# Patient Record
Sex: Female | Born: 1960 | Race: White | Hispanic: No | Marital: Single | Smoking: Never smoker
Health system: Southern US, Community
[De-identification: ages and names within clinical notes are randomized; demographics above are authoritative.]

## PROBLEM LIST (undated history)

## (undated) DIAGNOSIS — G4700 Insomnia, unspecified: Secondary | ICD-10-CM

## (undated) DIAGNOSIS — F4310 Post-traumatic stress disorder, unspecified: Secondary | ICD-10-CM

## (undated) DIAGNOSIS — R4702 Dysphasia: Secondary | ICD-10-CM

## (undated) DIAGNOSIS — M1990 Unspecified osteoarthritis, unspecified site: Secondary | ICD-10-CM

## (undated) DIAGNOSIS — I82409 Acute embolism and thrombosis of unspecified deep veins of unspecified lower extremity: Secondary | ICD-10-CM

## (undated) DIAGNOSIS — R569 Unspecified convulsions: Secondary | ICD-10-CM

---

## 2017-01-02 ENCOUNTER — Emergency Department (HOSPITAL_COMMUNITY)
Admission: EM | Admit: 2017-01-02 | Discharge: 2017-01-02 | Disposition: A | Discharge status: Skilled Nursing Facility | Payer: Medicare Other | Attending: Emergency Medicine | Admitting: Emergency Medicine

## 2017-01-02 DIAGNOSIS — R2243 Localized swelling, mass and lump, lower limb, bilateral: Secondary | ICD-10-CM | POA: Diagnosis present

## 2017-01-02 DIAGNOSIS — I82403 Acute embolism and thrombosis of unspecified deep veins of lower extremity, bilateral: Secondary | ICD-10-CM

## 2017-01-02 LAB — I-STAT CHEM 8, ED
BUN: 23 mg/dL — ABNORMAL HIGH (ref 6–20)
Calcium, Ion: 1.19 mmol/L (ref 1.15–1.40)
Chloride: 110 mmol/L (ref 101–111)
Creatinine, Ser: 0.8 mg/dL (ref 0.44–1.00)
Glucose, Bld: 93 mg/dL (ref 65–99)
HCT: 39 % (ref 36.0–46.0)
Hemoglobin: 13.3 g/dL (ref 12.0–15.0)
Potassium: 3.7 mmol/L (ref 3.5–5.1)
Sodium: 147 mmol/L — ABNORMAL HIGH (ref 135–145)
TCO2: 27 mmol/L (ref 22–32)

## 2017-01-02 MED ORDER — RIVAROXABAN (XARELTO) EDUCATION KIT FOR DVT/PE PATIENTS
PACK | Freq: Once | Status: DC
  Filled 2017-01-02: qty 1

## 2017-01-02 MED ORDER — RIVAROXABAN (XARELTO) VTE STARTER PACK (15 & 20 MG): ORAL_TABLET | ORAL | 0 refills | Status: DC

## 2017-01-02 MED ORDER — RIVAROXABAN 15 MG PO TABS
15.0000 mg | ORAL_TABLET | Freq: Once | ORAL | Status: AC
  Administered 2017-01-02: 15 mg via ORAL
  Filled 2017-01-02: qty 1

## 2017-01-02 NOTE — ED Provider Notes (Signed)
[facility]-EMERGENCY DEPT Provider Note   CSN: [id number] Arrival date & time: 01/02/17  1411     History   Chief Complaint Chief Complaint  Patient presents with  . Leg Swelling    HPI Brooke Palmer is a 56 y.o. female.  HPI   56yF sent from [facility] after she had b/l LE venous ultrasound. No formal read but paperwork listing results as:  1) ? Chronic Rt 2) Lt thrombus w/in superficial syst. GSV  Pt has hx of Down's syndrome and not able to provide additional history.   Past history includes: Down's syndrome, obesity, hypothyroidism, dysphagia, osteoarthritis, idiopathic orthostatic hypotension, seizure.  I do not see listed past hx of DVT/PE. No anticoagulants on med list. Not clear if recent surgery, immobilization, etc.  No past medical history on file.  There are no active problems to display for this patient.    OB History    No data available       Home Medications    Prior to Admission medications   Not on File    Family History No family history on file.  Social History Social History   Tobacco Use  . Smoking status: Not on file  Substance Use Topics  . Alcohol use: Not on file  . Drug use: Not on file     Allergies   Patient has no allergy information on record.   Review of Systems Review of Systems  Level 5 cavea because of mental retardation.   Physical Exam Updated Vital Signs BP (!) 131/103 (BP Location: Right Arm)   Pulse 70   Temp 97.8 F (36.6 C) (Oral)   Resp 15   SpO2 98%   Physical Exam  Constitutional: She appears well-developed.  HENT:  Head: Normocephalic and atraumatic.  Eyes: Conjunctivae are normal. Right eye exhibits no discharge. Left eye exhibits no discharge.  Neck: Neck supple.  Cardiovascular: Normal rate, regular rhythm and normal heart sounds. Exam reveals no gallop and no friction rub.  No murmur heard. Pulmonary/Chest: Effort normal and breath sounds normal. No respiratory  distress.  Abdominal: Soft. She exhibits no distension. There is no tenderness.  Musculoskeletal: She exhibits no edema or tenderness.  Sitting in bed with legs flexed underneath her. Resists attempts to extend. No obvious swelling noted. Does repeatedly withdrawal legs back under her when I try to examine. Hard to tell if because from pain or otherwise. Ruddy appearance of feet but warm to touch.   Neurological: She is alert.  Skin: Skin is warm and dry.  Nursing note and vitals reviewed.    ED Treatments / Results  Labs (all labs ordered are listed, but only abnormal results are displayed) Labs Reviewed - No data to display  EKG  EKG Interpretation None       Radiology No results found.  Procedures Procedures (including critical care time)  Medications Ordered in ED Medications - No data to display   Initial Impression / Assessment and Plan / ED Course  I have reviewed the triage vital signs and the nursing notes.  Pertinent labs & imaging results that were available during my care of the patient were reviewed by me and considered in my medical decision making (see chart for details).  Clinical Course as of Jan 03 1543  Wed Jan 02, 2017  1504 Caretaker now at bedside. She reports that patient is non-ambulatory. Likely precipitant. Other labs canceled but will check renal function. Discussed signs/symptoms to seek immediate re-evaluation with caretaker.  Outpt FU otherwise.   [SK]    Clinical Course User Index [SK] Raeford RazorKohut, Julea Hutto, MD    56yF with possible R DVT(?) of unclear chronicity and L greater saphenous clot. Cannot get ROS form patient. Not clear if inciting event. Will obtain hypercoag. Work-up. Will start xarelto. Not tachycardic or hypoxic.  Caretaker now at beside. She has been at their home for about a month. She has not been ambulatory the entire time. She is unsure of her history beyond this.  This is likely precipitant. Coag panel canceled. Will assess  renal function prior to xarelto dosing.   Final Clinical Impressions(s) / ED Diagnoses   Final diagnoses:  Deep vein thrombosis (DVT) of both lower extremities, unspecified chronicity, unspecified vein [facility](HCC)    ED Discharge Orders    None       Raeford RazorKohut, Ashleymarie Granderson, MD 01/03/17 [phone]

## 2017-01-02 NOTE — ED Notes (Signed)
Bed: WA19 Expected date:  Expected time:  Means of arrival:  Comments: EMS leg pain 

## 2017-01-02 NOTE — Discharge Instructions (Addendum)

## 2017-01-02 NOTE — ED Notes (Signed)
ED Provider at bedside. 

## 2017-01-02 NOTE — ED Triage Notes (Signed)
[facility] Doppler was performed today and showed clots in bilateral lower extremities. Paperwork present with patient. Patient sent here for further evaluation.

## 2017-01-18 ENCOUNTER — Telehealth: Payer: Self-pay | Admitting: Hematology

## 2017-01-18 NOTE — Telephone Encounter (Signed)
[facility]. Appt has been scheduled for the pt to see Dr. Candise CheKale on 02/14/17 at 10am. Aware that the pt should arrive 30 minutes early so that she can be checked in on time. Voiced understanding. Contact number for the referring is [phone].

## 2017-01-27 ENCOUNTER — Emergency Department (HOSPITAL_COMMUNITY): Payer: Medicare Other

## 2017-01-27 ENCOUNTER — Encounter (HOSPITAL_COMMUNITY): Payer: Self-pay | Admitting: Emergency Medicine

## 2017-01-27 ENCOUNTER — Emergency Department (HOSPITAL_COMMUNITY)
Admission: EM | Admit: 2017-01-27 | Discharge: 2017-01-27 | Disposition: A | Discharge status: Home / Self Care | Payer: Medicare Other | Attending: Emergency Medicine | Admitting: Emergency Medicine

## 2017-01-27 DIAGNOSIS — Z7901 Long term (current) use of anticoagulants: Secondary | ICD-10-CM | POA: Diagnosis not present

## 2017-01-27 DIAGNOSIS — R569 Unspecified convulsions: Secondary | ICD-10-CM | POA: Insufficient documentation

## 2017-01-27 DIAGNOSIS — H109 Unspecified conjunctivitis: Secondary | ICD-10-CM | POA: Diagnosis not present

## 2017-01-27 DIAGNOSIS — Z79899 Other long term (current) drug therapy: Secondary | ICD-10-CM | POA: Diagnosis not present

## 2017-01-27 DIAGNOSIS — Q909 Down syndrome, unspecified: Secondary | ICD-10-CM | POA: Insufficient documentation

## 2017-01-27 HISTORY — DX: Unspecified convulsions: R56.9

## 2017-01-27 HISTORY — DX: Acute embolism and thrombosis of unspecified deep veins of unspecified lower extremity: I82.409

## 2017-01-27 HISTORY — DX: Post-traumatic stress disorder, unspecified: F43.10

## 2017-01-27 HISTORY — DX: Insomnia, unspecified: G47.00

## 2017-01-27 HISTORY — DX: Dysphasia: R47.02

## 2017-01-27 HISTORY — DX: Unspecified osteoarthritis, unspecified site: M19.90

## 2017-01-27 LAB — CBC WITH DIFFERENTIAL/PLATELET
Basophils Absolute: 0.1 10*3/uL (ref 0.0–0.1)
Basophils Relative: 1 %
Eosinophils Absolute: 0.3 10*3/uL (ref 0.0–0.7)
Eosinophils Relative: 3 %
HEMATOCRIT: 37.6 % (ref 36.0–46.0)
Hemoglobin: 12.5 g/dL (ref 12.0–15.0)
LYMPHS ABS: 1.8 10*3/uL (ref 0.7–4.0)
LYMPHS PCT: 17 %
MCH: 34.2 pg — ABNORMAL HIGH (ref 26.0–34.0)
MCHC: 33.2 g/dL (ref 30.0–36.0)
MCV: 103 fL — AB (ref 78.0–100.0)
MONO ABS: 0.8 10*3/uL (ref 0.1–1.0)
MONOS PCT: 8 %
NEUTROS ABS: 7.5 10*3/uL (ref 1.7–7.7)
Neutrophils Relative %: 71 %
Platelets: 263 10*3/uL (ref 150–400)
RBC: 3.65 MIL/uL — ABNORMAL LOW (ref 3.87–5.11)
RDW: 14.8 % (ref 11.5–15.5)
WBC: 10.4 10*3/uL (ref 4.0–10.5)

## 2017-01-27 LAB — URINALYSIS, ROUTINE W REFLEX MICROSCOPIC
Bilirubin Urine: NEGATIVE
GLUCOSE, UA: NEGATIVE mg/dL
Ketones, ur: NEGATIVE mg/dL
LEUKOCYTES UA: NEGATIVE
Nitrite: NEGATIVE
PH: 7 (ref 5.0–8.0)
PROTEIN: NEGATIVE mg/dL
Specific Gravity, Urine: 1.015 (ref 1.005–1.030)

## 2017-01-27 LAB — COMPREHENSIVE METABOLIC PANEL
ALK PHOS: 114 U/L (ref 38–126)
ALT: 21 U/L (ref 14–54)
ANION GAP: 6 (ref 5–15)
AST: 18 U/L (ref 15–41)
Albumin: 3 g/dL — ABNORMAL LOW (ref 3.5–5.0)
BILIRUBIN TOTAL: 0.8 mg/dL (ref 0.3–1.2)
BUN: 15 mg/dL (ref 6–20)
CALCIUM: 8.7 mg/dL — AB (ref 8.9–10.3)
CO2: 26 mmol/L (ref 22–32)
Chloride: 112 mmol/L — ABNORMAL HIGH (ref 101–111)
Creatinine, Ser: 0.77 mg/dL (ref 0.44–1.00)
GFR calc Af Amer: >60 mL/min (ref >60)
Glucose, Bld: 97 mg/dL (ref 65–99)
POTASSIUM: 3.4 mmol/L — AB (ref 3.5–5.1)
Sodium: 144 mmol/L (ref 135–145)
TOTAL PROTEIN: 7.2 g/dL (ref 6.5–8.1)

## 2017-01-27 MED ORDER — ERYTHROMYCIN 5 MG/GM OP OINT: TOPICAL_OINTMENT | OPHTHALMIC | 0 refills | Status: DC

## 2017-01-27 MED ORDER — SODIUM CHLORIDE 0.9 % IV BOLUS (SEPSIS)
1000.0000 mL | Freq: Once | INTRAVENOUS | Status: AC
  Administered 2017-01-27: 1000 mL via INTRAVENOUS

## 2017-01-27 MED ORDER — SODIUM CHLORIDE 0.9 % IV SOLN
1000.0000 mg | Freq: Once | INTRAVENOUS | Status: AC
  Administered 2017-01-27: 1000 mg via INTRAVENOUS
  Filled 2017-01-27: qty 10

## 2017-01-27 MED ORDER — LEVETIRACETAM 1000 MG PO TABS: 1000.0000 mg | ORAL_TABLET | Freq: Two times a day (BID) | ORAL | 0 refills | Status: DC

## 2017-01-27 NOTE — ED Notes (Signed)
Attempted to draw labs x2 w/o success. Phlebotomy notified

## 2017-01-27 NOTE — ED Provider Notes (Addendum)
[facility]-EMERGENCY DEPT Provider Note   CSN: [id number] Arrival date & time: 01/27/17  [id number]     History   Chief Complaint Chief Complaint  Patient presents with  . Seizures    HPI Brooke Palmer is a 56 y.o. female.  HPI   56 year old female with a history of Down syndrome, DVT prescribed Xarelto in November, seizures, presents from group home with concern for seizure.  Staff had reported that patient is able to answer simple questions at her baseline.  She had a seizure this morning, but the facility was not able to produce paperwork for her.  The history is very limited from the patient, she is unable to answer any questions.  Attempted to call contact listed in our system, who is her legal guardian.  She gave me the number for Blanchie ServeSimone Erny, her care coordinator.  They were not aware that the patient was in the emergency department, are unable to provide further information regarding her medications, reports here at the group home.  Her care coordinator reports she will call back with a number for the group home to obtain more information.  Discussed with staff at group home and group home nurse.  Report she had one seizure at 3am and on at 8am. First lasted 4 minutes, second 2-3 minutes.  She did not get any medications.  As far as the nurse knows, she has not had any changes in medications, and has been taking her seizure medications. She has had one seizure prior to this since she has been at their group home in October.  She was declining and moved to the group home. She has been declining over the last several months but since they started her on CPAP for sleeping she has improved, been more alert.  They report if she has not slept well she will be sleepy during the day.  No known cough, vomiting, congestion, fever. No sick contacts in the house.   Past Medical History:  Diagnosis Date  . Arthritis    osteo  . DVT (deep venous thrombosis) (HCC)    bilateral legs  . Dysphasia   . Insomnia   . PTSD (post-traumatic stress disorder)   . Seizures (HCC)     There are no active problems to display for this patient.   History reviewed. No pertinent surgical history.  OB History    No data available       Home Medications    Prior to Admission medications   Medication Sig Start Date End Date Taking? Authorizing Provider  acetic acid 0.25 % irrigation Irrigate with 1 application as directed 2 (two) times daily.   Yes [provider]  Calcium Carbonate-Vitamin D (OSCAL 500/200 D-3 PO) Take 1 tablet by mouth daily.   Yes [provider]  coal tar (NEUTROGENA T-GEL) 0.5 % shampoo Apply 1 application topically at bedtime as needed.   Yes [provider]  fludrocortisone (FLORINEF) 0.1 MG tablet Take 0.1 mg by mouth daily. 12/12/16  Yes [provider]  lamoTRIgine (LAMICTAL) 100 MG tablet Take 100 mg by mouth daily. 12/12/16  Yes [provider]  levothyroxine (SYNTHROID, LEVOTHROID) 100 MCG tablet Take 100 mcg by mouth daily. 12/12/16  Yes [provider]  Melatonin 3 MG TABS Take 3 mg by mouth daily.   Yes [provider]  memantine (NAMENDA) 5 MG tablet Take 5 mg by mouth 2 (two) times daily. 12/12/16  Yes [provider]  Menthol-Zinc Oxide (RISAMINE)  0.44-20.625 % OINT Apply 1 application topically 2 (two) times daily.   Yes [provider]  metroNIDAZOLE (METROCREAM) 0.75 % cream Apply 1 application topically 2 (two) times daily. 12/03/16  Yes [provider]  montelukast (SINGULAIR) 10 MG tablet Take 10 mg by mouth daily. 12/12/16  Yes [provider]  nabumetone (RELAFEN) 500 MG tablet Take 500 mg by mouth daily. 12/12/16  Yes [provider]  omeprazole (PRILOSEC) 20 MG capsule Take 20 mg by mouth daily. 12/12/16  Yes [provider]  rivaroxaban (XARELTO) 20 MG TABS tablet Take 20 mg by mouth daily with supper.    Yes [provider]  sertraline (ZOLOFT) 25 MG tablet Take 25 mg by mouth daily. 12/12/16  Yes [provider]  erythromycin ophthalmic ointment Place a 1/2 inch ribbon of ointment into the lower eyelid QID for one week. 01/27/17   Alvira Monday, MD  levETIRAcetam (KEPPRA) 1000 MG tablet Take 1 tablet (1,000 mg total) by mouth 2 (two) times daily. 01/27/17   Alvira Monday, MD    Family History No family history on file.  Social History Social History   Tobacco Use  . Smoking status: Never Smoker  . Smokeless tobacco: Never Used  Substance Use Topics  . Alcohol use: No    Frequency: Never  . Drug use: No     Allergies   Patient has no known allergies.   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 No distress.  HENT:  Head: Normocephalic and atraumatic.  Eyes: Conjunctivae and EOM are normal. Pupils are equal, round, and reactive to light.  Yellow discharge from right eye  Neck: Normal range of motion.  Cardiovascular: Normal rate, regular rhythm, normal heart sounds and intact distal pulses. Exam reveals no gallop and no friction rub.  No murmur heard. Pulmonary/Chest: Effort normal and breath sounds normal. No respiratory distress. She has no wheezes. She has no rales.  Abdominal: Soft. She exhibits no distension. There is no tenderness. There is no guarding.  Musculoskeletal: She exhibits no edema or tenderness.  Neurological:  Sleepy but awakens with exam, cries, not following commands on initial eval  Skin: Skin is warm and dry. No rash noted. She is not diaphoretic. No erythema.  Erythema bilateral feet (noted on prior exams)ulcer between toes of left foot,     Nursing note and vitals reviewed.    ED Treatments / Results  Labs (all labs ordered are listed, but only abnormal results are displayed) Labs Reviewed  CBC WITH DIFFERENTIAL/PLATELET - Abnormal; Notable for the following components:      Result Value   RBC 3.65 (*)    MCV 103.0 (*)    MCH 34.2 (*)    All other components within normal limits  COMPREHENSIVE METABOLIC PANEL - Abnormal; Notable for the following components:   Potassium 3.4 (*)    Chloride 112 (*)    Calcium 8.7 (*)    Albumin 3.0 (*)    All other components within normal limits  URINALYSIS, ROUTINE W REFLEX MICROSCOPIC - Abnormal; Notable for the following components:   Color, Urine YELLOW (*)    APPearance CLOUDY (*)    Hgb  urine dipstick SMALL (*)    Bacteria, UA MANY (*)    Squamous Epithelial / LPF 0-5 (*)    All other components within normal limits  URINE CULTURE  LAMOTRIGINE LEVEL    EKG  EKG Interpretation  Date/Time:  Sunday January 27 2017 09:35:09 EST Ventricular Rate:  73 PR Interval:    QRS Duration: 96 QT Interval:  397 QTC Calculation: 438 R Axis:   82 Text Interpretation:  Sinus rhythm No previous ECGs available Confirmed by Alvira Monday ([id number]) on 01/27/2017 10:12:21 AM       Radiology Ct Head Wo Contrast  Result Date: 01/27/2017 CLINICAL DATA:  C-spine trauma.  Seizure. EXAM: CT HEAD WITHOUT CONTRAST CT CERVICAL SPINE WITHOUT CONTRAST TECHNIQUE: Multidetector CT imaging of the head and cervical spine was performed following the standard protocol without intravenous contrast. Multiplanar CT image reconstructions of the cervical spine were also generated. COMPARISON:  None. FINDINGS: CT HEAD FINDINGS Brain: There is a punctate hyperdense focus in the left basal ganglia. There is a similar less prominent focus in the right basal ganglia these are felt to represent basal ganglia calcifications. Allowing for this, there is no definitive evidence of acute intracranial hemorrhage.  Global atrophy. There is severe dilatation of the lateral ventricles. Moderate dilatation of the third ventricle. Fourth ventricle is mildly dilated. There is no mass effect or midline shift. Vascular: No hyperdense vessel or unexpected calcification. Skull: Cranium is intact. Sinuses/Orbits: No evidence of vitreous or orbital hemorrhage. Paranasal sinuses are grossly clear. Other: Noncontributory CT CERVICAL SPINE FINDINGS There is straightening of the normally lordotic cervical spine. There is some malalignment of the left atlantoaxial joint. No definite acute fracture or dislocation. The pre dens space is at the upper limit of normal in the gap distance. Chronic bony densities adjacent to the odontoid is present. Advanced spondylitic changes with calcification of the posterior longitudinal ligament. This results in spinal stenosis at C4-5. There is less prominent spinal stenosis at other levels. There is no obvious spinal hematoma or soft tissue hematoma within the neck. Prominent central disc herniation is suspected at C3-4 with spinal stenosis. Lung apices are grossly clear. Thyroid is unremarkable. There is advanced disc space narrowing at C3-4, C4-5, and C5-6. Moderate narrowing occurs at the C2-3 and C6-7 disc spaces. IMPRESSION: Head: No acute intracranial pathology. 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 Cervical spine: No acute bony injury. 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 Prominent central disc herniation is suspected at C3-4 causing spinal stenosis. Electronically Signed   By: Jolaine Click M.D.   On: 01/27/2017 11:33   Ct Cervical Spine Wo Contrast  Result Date: 01/27/2017 CLINICAL DATA:  C-spine trauma.  Seizure. EXAM: CT HEAD WITHOUT CONTRAST CT CERVICAL SPINE WITHOUT CONTRAST TECHNIQUE: Multidetector CT imaging of the head and cervical spine was performed following the standard protocol without intravenous  contrast. Multiplanar CT image reconstructions of the cervical spine were also generated. COMPARISON:  None. FINDINGS: CT HEAD FINDINGS Brain: There is a punctate hyperdense focus in the left basal ganglia. There is a similar less prominent focus in the right basal ganglia these are felt to represent basal ganglia calcifications. Allowing for this, there is no definitive evidence of acute intracranial hemorrhage. Global atrophy. There is severe dilatation of the lateral ventricles. Moderate dilatation of the third ventricle. Fourth ventricle is mildly dilated. There is no mass effect or midline shift. Vascular: No hyperdense vessel or unexpected calcification. Skull: Cranium is intact.  Sinuses/Orbits: No evidence of vitreous or orbital hemorrhage. Paranasal sinuses are grossly clear. Other: Noncontributory CT CERVICAL SPINE FINDINGS There is straightening of the normally lordotic cervical spine. There is some malalignment of the left atlantoaxial joint. No definite acute fracture or dislocation. The pre dens space is at the upper limit of normal in the gap distance. Chronic bony densities adjacent to the odontoid is present. Advanced spondylitic changes with calcification of the posterior longitudinal ligament. This results in spinal stenosis at C4-5. There is less prominent spinal stenosis at other levels. There is no obvious spinal hematoma or soft tissue hematoma within the neck. Prominent central disc herniation is suspected at C3-4 with spinal stenosis. Lung apices are grossly clear. Thyroid is unremarkable. There is advanced disc space narrowing at C3-4, C4-5, and C5-6. Moderate narrowing occurs at the C2-3 and C6-7 disc spaces. IMPRESSION: Head: No acute intracranial pathology. 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 Cervical spine: No acute bony injury. 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 Prominent central disc herniation is  suspected at C3-4 causing spinal stenosis. Electronically Signed   By: Jolaine ClickArthur  Hoss M.D.   On: 01/27/2017 11:33    Procedures Procedures (including critical care time)  Medications Ordered in ED Medications  sodium chloride 0.9 % bolus 1,000 mL (0 mLs Intravenous Stopped 01/27/17 1159)  levETIRAcetam (KEPPRA) 1,000 mg in sodium chloride 0.9 % 100 mL IVPB (0 mg Intravenous Stopped 01/27/17 1159)     Initial Impression / Assessment and Plan / ED Course  I have reviewed the triage vital signs and the nursing notes.  Pertinent labs & imaging results that were available during my care of the patient were reviewed by me and considered in my medical decision making (see chart for details).     56 year old female with a history of Down syndrome, DVT prescribed Xarelto in November, seizures, presents from group home with concern for seizure.  History is very limited on arrival, as patient is unable to answer questions, no paperwork was provided by the group home.  Called [facility] services, her legal guardian, as well as her care coordinator.  At this time I am waiting for a number to call back to call the group home to obtain more information.  Placed orders to evaluate electrolytes, look for signs of urinary tract infection, and will load with 1 g of Keppra.  Uncertain what medication patient takes chronically for her seizures at this time.  Given I am not sure whether she had a seizure and fell, that she is on Xarelto, will order head CT to evaluate for intracranial bleed.   Labs and CT without significant findings. Urinalysis shows bacteria but no leukocytes.  No respiratory symptoms per facility, no leukocytosis, no fever. Doubt pneumonia. Patient noted to have conjunctival injection and discharge, possible viral conjunctivitis but will give erythromycin ointment.  Unclear trigger to seizure, possible lack of sleep or viral etiology.  Will increase Keppra to 1000mg  BID from 750mg .  Patient sleepy  in ED, however waking up, looking around room.  Staff states it is not unusual for her to be sleepy after being awake at night which was the case last night, and suspect some post-ictal component. She has not had continuing seizures, is responding per her baseline by history, has no signs of sepsis or infection, and feel she is appropriate for continued outpatient follow up.  Gave rx for conjunctivitis and increased keppra.  Provided number for podiatry given foot  ulceration. Patient with erythema which appears chronic skin changes by history from staff and per prior ED note, doubt acute cellulitis.   Final Clinical Impressions(s) / ED Diagnoses   Final diagnoses:  Seizure (HCC)  Conjunctivitis of both eyes, unspecified conjunctivitis type    ED Discharge Orders        Ordered    levETIRAcetam (KEPPRA) 1000 MG tablet  2 times daily     01/27/17 1613    erythromycin ophthalmic ointment     01/27/17 1614       Alvira Monday, MD 01/27/17 1610    Alvira Monday, MD 01/28/17 1758

## 2017-01-27 NOTE — ED Notes (Signed)
Still no urine output in pure wick at this time

## 2017-01-27 NOTE — Discharge Instructions (Signed)
We have increased your Keppra dosing to 1000mg  twice daily and increase may begin tomorrow 12/17.

## 2017-01-27 NOTE — ED Notes (Signed)
No urine noted in pure wick 

## 2017-01-27 NOTE — ED Triage Notes (Signed)
Pt from group home - [facility] with c/o seizure. Pt has hx of the same. Pt has hx of Down's Syndrome. Staff member sts that pt is able to answer simple questions and sts that pt is at her baseline. Staff sts that pt had seizure this am but unable to produce paperwork for pt. Pt in NAD

## 2017-01-27 NOTE — ED Triage Notes (Signed)
Pt unable to answer screening questions d/t Downs Syndrome. Unable to complete screening questions.

## 2017-01-27 NOTE — ED Notes (Signed)
Brooke Palmer in place to obtain urine specimen.

## 2017-01-27 NOTE — ED Notes (Signed)
Bed: [id number] Expected date: 01/27/17 Expected time: 9:12 AM Means of arrival: Ambulance Comments: seizure

## 2017-01-28 LAB — URINE CULTURE

## 2017-01-29 LAB — LAMOTRIGINE LEVEL: LAMOTRIGINE LVL: 3 ug/mL (ref 2.0–20.0)

## 2017-02-02 ENCOUNTER — Encounter (HOSPITAL_COMMUNITY): Payer: Self-pay | Admitting: Emergency Medicine

## 2017-02-02 ENCOUNTER — Inpatient Hospital Stay (HOSPITAL_COMMUNITY)
Admission: EM | Admit: 2017-02-02 | Discharge: 2017-02-07 | DRG: 682 | Disposition: A | Discharge status: Home / Self Care | Payer: Medicare Other | Attending: Internal Medicine | Admitting: Internal Medicine

## 2017-02-02 ENCOUNTER — Emergency Department (HOSPITAL_COMMUNITY): Payer: Medicare Other

## 2017-02-02 DIAGNOSIS — E039 Hypothyroidism, unspecified: Secondary | ICD-10-CM | POA: Diagnosis present

## 2017-02-02 DIAGNOSIS — R404 Transient alteration of awareness: Secondary | ICD-10-CM

## 2017-02-02 DIAGNOSIS — E038 Other specified hypothyroidism: Secondary | ICD-10-CM | POA: Diagnosis not present

## 2017-02-02 DIAGNOSIS — Q909 Down syndrome, unspecified: Secondary | ICD-10-CM | POA: Diagnosis not present

## 2017-02-02 DIAGNOSIS — D72829 Elevated white blood cell count, unspecified: Secondary | ICD-10-CM | POA: Diagnosis present

## 2017-02-02 DIAGNOSIS — E876 Hypokalemia: Secondary | ICD-10-CM | POA: Diagnosis present

## 2017-02-02 DIAGNOSIS — R4189 Other symptoms and signs involving cognitive functions and awareness: Secondary | ICD-10-CM | POA: Diagnosis present

## 2017-02-02 DIAGNOSIS — N179 Acute kidney failure, unspecified: Secondary | ICD-10-CM | POA: Diagnosis present

## 2017-02-02 DIAGNOSIS — R1310 Dysphagia, unspecified: Secondary | ICD-10-CM | POA: Diagnosis present

## 2017-02-02 DIAGNOSIS — J690 Pneumonitis due to inhalation of food and vomit: Secondary | ICD-10-CM | POA: Diagnosis present

## 2017-02-02 DIAGNOSIS — E860 Dehydration: Secondary | ICD-10-CM | POA: Diagnosis present

## 2017-02-02 DIAGNOSIS — G40909 Epilepsy, unspecified, not intractable, without status epilepticus: Secondary | ICD-10-CM

## 2017-02-02 DIAGNOSIS — I82409 Acute embolism and thrombosis of unspecified deep veins of unspecified lower extremity: Secondary | ICD-10-CM | POA: Diagnosis present

## 2017-02-02 DIAGNOSIS — K219 Gastro-esophageal reflux disease without esophagitis: Secondary | ICD-10-CM | POA: Diagnosis present

## 2017-02-02 DIAGNOSIS — E871 Hypo-osmolality and hyponatremia: Secondary | ICD-10-CM | POA: Diagnosis not present

## 2017-02-02 DIAGNOSIS — I959 Hypotension, unspecified: Secondary | ICD-10-CM | POA: Diagnosis present

## 2017-02-02 DIAGNOSIS — Z7952 Long term (current) use of systemic steroids: Secondary | ICD-10-CM | POA: Diagnosis not present

## 2017-02-02 DIAGNOSIS — R0989 Other specified symptoms and signs involving the circulatory and respiratory systems: Secondary | ICD-10-CM

## 2017-02-02 DIAGNOSIS — Z86718 Personal history of other venous thrombosis and embolism: Secondary | ICD-10-CM

## 2017-02-02 DIAGNOSIS — E870 Hyperosmolality and hypernatremia: Secondary | ICD-10-CM | POA: Diagnosis present

## 2017-02-02 DIAGNOSIS — Z79890 Hormone replacement therapy (postmenopausal): Secondary | ICD-10-CM

## 2017-02-02 DIAGNOSIS — R4701 Aphasia: Secondary | ICD-10-CM | POA: Diagnosis present

## 2017-02-02 DIAGNOSIS — Z7901 Long term (current) use of anticoagulants: Secondary | ICD-10-CM | POA: Diagnosis not present

## 2017-02-02 DIAGNOSIS — R05 Cough: Secondary | ICD-10-CM | POA: Diagnosis present

## 2017-02-02 LAB — CBC WITH DIFFERENTIAL/PLATELET
Basophils Absolute: 0 10*3/uL (ref 0.0–0.1)
Basophils Relative: 0 %
EOS ABS: 0 10*3/uL (ref 0.0–0.7)
EOS PCT: 0 %
HCT: 36.7 % (ref 36.0–46.0)
Hemoglobin: 11.9 g/dL — ABNORMAL LOW (ref 12.0–15.0)
LYMPHS ABS: 1.6 10*3/uL (ref 0.7–4.0)
LYMPHS PCT: 10 %
MCH: 33.8 pg (ref 26.0–34.0)
MCHC: 32.4 g/dL (ref 30.0–36.0)
MCV: 104.3 fL — AB (ref 78.0–100.0)
MONO ABS: 0.7 10*3/uL (ref 0.1–1.0)
Monocytes Relative: 5 %
Neutro Abs: 13.4 10*3/uL — ABNORMAL HIGH (ref 1.7–7.7)
Neutrophils Relative %: 85 %
PLATELETS: 279 10*3/uL (ref 150–400)
RBC: 3.52 MIL/uL — AB (ref 3.87–5.11)
RDW: 15.1 % (ref 11.5–15.5)
WBC: 15.8 10*3/uL — AB (ref 4.0–10.5)

## 2017-02-02 LAB — COMPREHENSIVE METABOLIC PANEL
ALBUMIN: 2.7 g/dL — AB (ref 3.5–5.0)
ALT: 20 U/L (ref 14–54)
AST: 27 U/L (ref 15–41)
Alkaline Phosphatase: 106 U/L (ref 38–126)
Anion gap: 8 (ref 5–15)
BILIRUBIN TOTAL: 0.5 mg/dL (ref 0.3–1.2)
BUN: 26 mg/dL — AB (ref 6–20)
CHLORIDE: 118 mmol/L — AB (ref 101–111)
CO2: 23 mmol/L (ref 22–32)
CREATININE: 1.59 mg/dL — AB (ref 0.44–1.00)
Calcium: 8.9 mg/dL (ref 8.9–10.3)
GFR calc Af Amer: 41 mL/min — ABNORMAL LOW (ref >60)
GFR, EST NON AFRICAN AMERICAN: 35 mL/min — AB (ref >60)
GLUCOSE: 141 mg/dL — AB (ref 65–99)
Potassium: 3.6 mmol/L (ref 3.5–5.1)
Sodium: 149 mmol/L — ABNORMAL HIGH (ref 135–145)
TOTAL PROTEIN: 7.4 g/dL (ref 6.5–8.1)

## 2017-02-02 LAB — BRAIN NATRIURETIC PEPTIDE: B NATRIURETIC PEPTIDE 5: 89.7 pg/mL (ref 0.0–100.0)

## 2017-02-02 MED ORDER — LACTATED RINGERS IV BOLUS (SEPSIS)
1000.0000 mL | Freq: Once | INTRAVENOUS | Status: AC
  Administered 2017-02-02: 1000 mL via INTRAVENOUS

## 2017-02-02 MED ORDER — SODIUM CHLORIDE 0.9 % IV BOLUS (SEPSIS): 1000.0000 mL | Freq: Once | INTRAVENOUS | Status: AC

## 2017-02-02 MED ORDER — ALBUTEROL (5 MG/ML) CONTINUOUS INHALATION SOLN
10.0000 mg/h | INHALATION_SOLUTION | Freq: Once | RESPIRATORY_TRACT | Status: AC
  Administered 2017-02-02: 10 mg/h via RESPIRATORY_TRACT
  Filled 2017-02-02: qty 20

## 2017-02-02 NOTE — ED Provider Notes (Addendum)
[facility]-EMERGENCY DEPT Provider Note   CSN: [id number] Arrival date & time: 02/02/17  2021     History   Chief Complaint Chief Complaint  Patient presents with  . Cough    HPI Brooke Palmer is a 56 y.o. female.  HPI  Patient with multiple medical issues including baseline nonverbal status, cognitive delay presents from her group home due to staff concern of increased work of breathing. Patient herself cannot provide any details of the HPI. Level 5 caveat secondary to nonverbal status. Reportedly the patient was in her usual state of health until today, which was noticed by staff members to have increased work of breathing. EMS notes the patient may have had hypoxia in route, but by the time of ED arrival, after breathing treatment, the patient has had normal oxygen saturation on room air.   Past Medical History:  Diagnosis Date  . Arthritis    osteo  . DVT (deep venous thrombosis) (HCC)    bilateral legs  . Dysphasia   . Insomnia   . PTSD (post-traumatic stress disorder)   . Seizures (HCC)     There are no active problems to display for this patient.   History reviewed. No pertinent surgical history.  OB History    No data available       Home Medications    Prior to Admission medications   Medication Sig Start Date End Date Taking? Authorizing Provider  acetic acid 0.25 % irrigation Irrigate with 1 application as directed 2 (two) times daily.    [provider]  Calcium Carbonate-Vitamin D (OSCAL 500/200 D-3 PO) Take 1 tablet by mouth daily.    [provider]  coal tar (NEUTROGENA T-GEL) 0.5 % shampoo Apply 1 application topically at bedtime as needed.    [provider]  erythromycin ophthalmic ointment Place a 1/2 inch ribbon of ointment into the lower eyelid QID for one week. 01/27/17   Alvira Monday, MD  fludrocortisone (FLORINEF) 0.1 MG tablet Take 0.1 mg by mouth daily. 12/12/16   [provider]  lamoTRIgine (LAMICTAL) 100 MG tablet Take 100 mg by mouth daily. 12/12/16   [provider]  levETIRAcetam (KEPPRA) 1000 MG tablet Take 1 tablet (1,000 mg total) by mouth 2 (two) times daily. 01/27/17   Alvira Monday, MD  levothyroxine (SYNTHROID, LEVOTHROID) 100 MCG tablet Take 100 mcg by mouth daily. 12/12/16   [provider]  Melatonin 3 MG TABS Take 3 mg by mouth daily.    [provider]  memantine (NAMENDA) 5 MG tablet Take 5 mg by mouth 2 (two) times daily. 12/12/16   [provider]  Menthol-Zinc Oxide (RISAMINE) 0.44-20.625 % OINT Apply 1 application topically 2 (two) times daily.    [provider]  metroNIDAZOLE (METROCREAM) 0.75 % cream Apply 1 application topically 2 (two) times daily. 12/03/16   [provider]  montelukast (SINGULAIR) 10 MG tablet Take 10 mg by mouth daily. 12/12/16   [provider]  nabumetone (RELAFEN) 500 MG tablet Take 500 mg by mouth daily. 12/12/16   [provider]  omeprazole (PRILOSEC) 20 MG capsule Take 20 mg by mouth daily. 12/12/16   [provider]  rivaroxaban (XARELTO) 20 MG TABS tablet Take 20 mg by mouth daily with supper.    [provider]  sertraline (ZOLOFT) 25 MG tablet Take 25 mg by mouth daily. 12/12/16   [provider]    Family History No family history on file.  Social History Social History   Tobacco Use  . Smoking status: Never Smoker  . Smokeless tobacco: Never Used  Substance Use Topics  . Alcohol use: No    Frequency: Never  . Drug use: No     Allergies   Patient has no known allergies.   Review of Systems Review of Systems  Unable to perform ROS: Patient nonverbal     Physical Exam Updated Vital Signs BP 103/83 (BP Location: Left Arm)   Pulse 84   Temp 98.4 F (36.9 C) (Oral)   Resp 16   SpO2 95%   Physical Exam  Constitutional: She is oriented to person, place, and time. She has a  sickly appearance. No distress.  Sickly appearing young female awake and alert, sitting upright in bed, noncommunicative  HENT:  Head: Normocephalic and atraumatic.  Eyes: Conjunctivae and EOM are normal.  Cardiovascular: Normal rate and regular rhythm.  Pulmonary/Chest: No stridor. She has decreased breath sounds. She has wheezes.  Abdominal: She exhibits no distension.  Musculoskeletal: She exhibits no edema.  Neurological: She is alert and oriented to person, place, and time. She displays atrophy. No cranial nerve deficit.  Does not follow commands reliably, does not participate in neurologic exam, but does move all extremities spontaneously, has no facial asymmetry. Has stigmata of congenital  Skin: Skin is warm and dry.  Psychiatric: Cognition and memory are impaired. She is noncommunicative.  Nursing note and vitals reviewed.    ED Treatments / Results  Labs (all labs ordered are listed, but only abnormal results are displayed) Labs Reviewed  COMPREHENSIVE METABOLIC PANEL - Abnormal; Notable for the following components:      Result Value   Sodium 149 (*)    Chloride 118 (*)    Glucose, Bld 141 (*)    BUN 26 (*)    Creatinine, Ser 1.59 (*)    Albumin 2.7 (*)    GFR calc non Af Amer 35 (*)    GFR calc Af Amer 41 (*)    All other components within normal limits  CBC WITH DIFFERENTIAL/PLATELET - Abnormal; Notable for the following components:   WBC 15.8 (*)    RBC 3.52 (*)    Hemoglobin 11.9 (*)    MCV 104.3 (*)    Neutro Abs 13.4 (*)    All other components within normal limits  BRAIN NATRIURETIC PEPTIDE    EKG  EKG Interpretation  Date/Time:  Saturday February 02 2017 23:29:04 EST Ventricular Rate:  94 PR Interval:    QRS Duration: 83 QT Interval:  348 QTC Calculation: 436 R Axis:   49 Text Interpretation:  Sinus rhythm Low voltage, precordial leads Baseline wander in lead(s) V1 T wave abnormality Abnormal ekg Confirmed by Gerhard MunchLockwood, Tymon Nemetz [phone]) on  02/02/2017 11:34:22 PM       Radiology Dg Chest 2 View  Result Date: 02/02/2017 CLINICAL DATA:  56 year old female with shortness of breath. EXAM: CHEST  2 VIEW COMPARISON:  None. FINDINGS: Shallow inspiration with minimal bibasilar atelectasis. There is no focal consolidation, pleural effusion, pneumothorax. The cardiac silhouette is within normal limits. Mild prominence of the central vasculature may represent mild vascular congestion. No acute osseous pathology. IMPRESSION: Probable mild vascular congestion.  No focal consolidation. Electronically Signed   By: Elgie CollardArash  Radparvar M.D.   On: 02/02/2017 21:31    Procedures Procedures (including critical care time)  Medications Ordered in ED Medications  lactated ringers bolus 1,000 mL (1,000 mLs Intravenous New Bag/Given 02/02/17 2159)  sodium chloride  0.9 % bolus 1,000 mL (not administered)  albuterol (PROVENTIL,VENTOLIN) solution continuous neb (10 mg/hr Nebulization Given 02/02/17 2136)     Initial Impression / Assessment and Plan / ED Course  I have reviewed the triage vital signs and the nursing notes.  Pertinent labs & imaging results that were available during my care of the patient were reviewed by me and considered in my medical decision making (see chart for details).  10:57 PM Now, following several breathing treatments, the patient's respiratory effort has diminished, lung sounds are more clear, though not completely clear. Initial findings inconsistent with congestive heart failure, though she does have mild persistent rhonchi.  I just discussed the patient's recent status with a nurse from her group home. She notes that the patient is a recent arrival to the facility, has multiple medical issues including Down syndrome, seizure disorder, but had been doing generally well. Patient was noted to have a change in behavior in the hours prior to ED arrival, with less interactivity, though without syncope, fall, fever. Nurse  denies any recent medication changes, diet changes, beyond addition of doxycycline, which the patient has been taking for the past few days. The nurse adds that since arrival to the nursing facility the patient has had a slow decline in overall health status.    This patient with Down syndrome, baseline nonverbal status presents from a group home with change in behavior, cough. The patient is persistently nonverbal, has coarse breath sounds initially, and is mildly hypotensive.  Patient's breathing improved here with bronchodilators, blood pressure improved with fluids, and she remained afebrile. No obvious evidence for pneumonia, no decompensated heart failure, though the latter remains a consideration for early disease. Patient has already been on doxycycline, and given the absence of fever, pneumonia remains likely Patient is found to have acute renal failure, likely contributing to her substantial change in condition at her group home. Patient required admission for further evaluation and management.   Final Clinical Impressions(s) / ED Diagnoses  Altered mental status Acute renal failure   Gerhard MunchLockwood, Kaylynne Andres, MD 02/02/17 [id number]    Gerhard MunchLockwood, Orian Figueira, MD 02/02/17 [phone]

## 2017-02-02 NOTE — ED Notes (Signed)
Respiratory called for breathing treatment.

## 2017-02-02 NOTE — ED Triage Notes (Signed)
Per EMS, patient from group home, staff reports increased lethargy, cough, and congestion. Initially 80% on RA. Hx downs syndrome.   22g L AC NS with EMS  O2 99% on non rebreather BP 140/80  HR 70 CBG 182

## 2017-02-02 NOTE — ED Notes (Signed)
Patient transported to X-ray 

## 2017-02-02 NOTE — ED Notes (Signed)
ED Provider at bedside. 

## 2017-02-03 DIAGNOSIS — E860 Dehydration: Secondary | ICD-10-CM

## 2017-02-03 LAB — CBC
HEMATOCRIT: 31.9 % — AB (ref 36.0–46.0)
HEMOGLOBIN: 10.4 g/dL — AB (ref 12.0–15.0)
MCH: 33.7 pg (ref 26.0–34.0)
MCHC: 32.6 g/dL (ref 30.0–36.0)
MCV: 103.2 fL — ABNORMAL HIGH (ref 78.0–100.0)
Platelets: 255 10*3/uL (ref 150–400)
RBC: 3.09 MIL/uL — ABNORMAL LOW (ref 3.87–5.11)
RDW: 14.8 % (ref 11.5–15.5)
WBC: 12 10*3/uL — ABNORMAL HIGH (ref 4.0–10.5)

## 2017-02-03 LAB — COMPREHENSIVE METABOLIC PANEL
ALBUMIN: 2.4 g/dL — AB (ref 3.5–5.0)
ALT: 27 U/L (ref 14–54)
ANION GAP: 7 (ref 5–15)
AST: 39 U/L (ref 15–41)
Alkaline Phosphatase: 93 U/L (ref 38–126)
BILIRUBIN TOTAL: 0.6 mg/dL (ref 0.3–1.2)
BUN: 27 mg/dL — ABNORMAL HIGH (ref 6–20)
CO2: 23 mmol/L (ref 22–32)
Calcium: 8.7 mg/dL — ABNORMAL LOW (ref 8.9–10.3)
Chloride: 120 mmol/L — ABNORMAL HIGH (ref 101–111)
Creatinine, Ser: 1.27 mg/dL — ABNORMAL HIGH (ref 0.44–1.00)
GFR calc Af Amer: 53 mL/min — ABNORMAL LOW (ref >60)
GFR calc non Af Amer: 46 mL/min — ABNORMAL LOW (ref >60)
GLUCOSE: 98 mg/dL (ref 65–99)
POTASSIUM: 2.9 mmol/L — AB (ref 3.5–5.1)
SODIUM: 150 mmol/L — AB (ref 135–145)
TOTAL PROTEIN: 6.5 g/dL (ref 6.5–8.1)

## 2017-02-03 LAB — HIV ANTIBODY (ROUTINE TESTING W REFLEX): HIV SCREEN 4TH GENERATION: NONREACTIVE

## 2017-02-03 LAB — TSH: TSH: 2.489 u[IU]/mL (ref 0.350–4.500)

## 2017-02-03 MED ORDER — DEXTROSE 5 % IV SOLN
INTRAVENOUS | Status: DC
  Administered 2017-02-03 – 2017-02-05 (×3): via INTRAVENOUS

## 2017-02-03 MED ORDER — HEPARIN SODIUM (PORCINE) 5000 UNIT/ML IJ SOLN: 5000.0000 [IU] | Freq: Three times a day (TID) | INTRAMUSCULAR | Status: DC

## 2017-02-03 MED ORDER — SODIUM CHLORIDE 0.9 % IV SOLN
1000.0000 mg | Freq: Two times a day (BID) | INTRAVENOUS | Status: DC
  Administered 2017-02-03 (×2): 1000 mg via INTRAVENOUS
  Filled 2017-02-03 (×3): qty 10

## 2017-02-03 MED ORDER — MEMANTINE HCL 10 MG PO TABS
5.0000 mg | ORAL_TABLET | Freq: Two times a day (BID) | ORAL | Status: DC
  Administered 2017-02-03 – 2017-02-07 (×9): 5 mg via ORAL
  Filled 2017-02-03 (×9): qty 1

## 2017-02-03 MED ORDER — LEVETIRACETAM 500 MG PO TABS
1000.0000 mg | ORAL_TABLET | Freq: Two times a day (BID) | ORAL | Status: DC
  Administered 2017-02-04 – 2017-02-07 (×7): 1000 mg via ORAL
  Filled 2017-02-03 (×7): qty 2

## 2017-02-03 MED ORDER — ZINC OXIDE 40 % EX OINT
TOPICAL_OINTMENT | Freq: Two times a day (BID) | CUTANEOUS | Status: DC
  Administered 2017-02-03 – 2017-02-07 (×9): via TOPICAL
  Filled 2017-02-03: qty 57

## 2017-02-03 MED ORDER — FLUDROCORTISONE ACETATE 0.1 MG PO TABS
0.1000 mg | ORAL_TABLET | Freq: Every day | ORAL | Status: DC
  Administered 2017-02-03 – 2017-02-07 (×5): 0.1 mg via ORAL
  Filled 2017-02-03 (×5): qty 1

## 2017-02-03 MED ORDER — MENTHOL-ZINC OXIDE 0.44-20.625 % EX OINT: 1.0000 "application " | TOPICAL_OINTMENT | Freq: Two times a day (BID) | CUTANEOUS | Status: DC

## 2017-02-03 MED ORDER — MELATONIN 3 MG PO TABS: 3.0000 mg | ORAL_TABLET | Freq: Every day | ORAL | Status: DC

## 2017-02-03 MED ORDER — LEVETIRACETAM 500 MG PO TABS: 1000.0000 mg | ORAL_TABLET | Freq: Two times a day (BID) | ORAL | Status: DC

## 2017-02-03 MED ORDER — POTASSIUM CHLORIDE CRYS ER 20 MEQ PO TBCR
40.0000 meq | EXTENDED_RELEASE_TABLET | ORAL | Status: AC
  Administered 2017-02-03 (×2): 40 meq via ORAL
  Filled 2017-02-03 (×2): qty 2

## 2017-02-03 MED ORDER — RIVAROXABAN 20 MG PO TABS
20.0000 mg | ORAL_TABLET | Freq: Every day | ORAL | Status: DC
  Administered 2017-02-03 – 2017-02-06 (×4): 20 mg via ORAL
  Filled 2017-02-03 (×4): qty 1

## 2017-02-03 MED ORDER — PANTOPRAZOLE SODIUM 40 MG PO TBEC
40.0000 mg | DELAYED_RELEASE_TABLET | Freq: Every day | ORAL | Status: DC
  Administered 2017-02-03 – 2017-02-07 (×5): 40 mg via ORAL
  Filled 2017-02-03 (×5): qty 1

## 2017-02-03 MED ORDER — LEVOTHYROXINE SODIUM 100 MCG PO TABS
100.0000 ug | ORAL_TABLET | Freq: Every day | ORAL | Status: DC
  Administered 2017-02-03 – 2017-02-07 (×5): 100 ug via ORAL
  Filled 2017-02-03 (×5): qty 1

## 2017-02-03 MED ORDER — NABUMETONE 500 MG PO TABS
500.0000 mg | ORAL_TABLET | Freq: Every day | ORAL | Status: DC
  Administered 2017-02-03 – 2017-02-07 (×5): 500 mg via ORAL
  Filled 2017-02-03 (×5): qty 1

## 2017-02-03 MED ORDER — MONTELUKAST SODIUM 10 MG PO TABS
10.0000 mg | ORAL_TABLET | Freq: Every day | ORAL | Status: DC
  Administered 2017-02-03 – 2017-02-07 (×5): 10 mg via ORAL
  Filled 2017-02-03 (×5): qty 1

## 2017-02-03 MED ORDER — ACETIC ACID 0.25 % IR SOLN: 1.0000 "application " | Freq: Two times a day (BID) | Status: DC

## 2017-02-03 MED ORDER — SERTRALINE HCL 25 MG PO TABS
25.0000 mg | ORAL_TABLET | Freq: Every day | ORAL | Status: DC
  Administered 2017-02-03 – 2017-02-07 (×5): 25 mg via ORAL
  Filled 2017-02-03 (×5): qty 1

## 2017-02-03 MED ORDER — SODIUM CHLORIDE 0.9 % IV SOLN
INTRAVENOUS | Status: DC
  Administered 2017-02-03: 04:00:00 via INTRAVENOUS

## 2017-02-03 MED ORDER — LAMOTRIGINE 100 MG PO TABS
100.0000 mg | ORAL_TABLET | Freq: Every day | ORAL | Status: DC
  Administered 2017-02-03 – 2017-02-07 (×5): 100 mg via ORAL
  Filled 2017-02-03 (×5): qty 1

## 2017-02-03 NOTE — Progress Notes (Signed)
PROGRESS NOTE    Brooke AngelCarolyn Palmer  ZOX:[id number] DOB: 07/11/1960 DOA: 02/02/2017 PCP: Patient, No Pcp Per   Brief Narrative:   Assessment & Plan:   Principal Problem:   ARF (acute renal failure) (HCC) Active Problems:   Dehydration   Leucocytosis   Seizure disorder (HCC)   Hypothyroid   GERD (gastroesophageal reflux disease)   DVT (deep venous thrombosis) (HCC)   Patient is a 56 year old female with past medical history of cognitive delay with aphasia who presented to the emergency department with shortness of breath and weakness.  Patient is from group home.  In the ER patient was found to have dehydration with acute kidney injury and was admitted for further management.  Acute kidney injury: Likely prerenal .on IV fluids. AKI  Improving.  We will continue to monitor her kidney function.  Hyponatremia: Sodium 150 today.  Start on D5W  Hypokalemia: Being supplemented.  We will continue to monitor the levels.  We will check magnesium level.  Leukocytosis: Unknown etiology.  Most likely reactive .  We will continue to monitor trend  Cognitive impairment/aphasia: On baseline. Patient is from group home  Seizure disorder: On Keppra and Lamictal.  History of DVT: On Xarelto  GERD: Continue PPI   DVT prophylaxis: Xarelto Code Status:  Full Family Communication:  None Disposition Plan:  Group Home   Consultants: None  Procedures: None  Antimicrobials: None  Subjective: Patient seen and examined the bedside this morning ,looks comfortable.  Patient is nonverbal at baseline.  Objective: Vitals:   02/03/17 0130 02/03/17 0220 02/03/17 0459 02/03/17 1418  BP: (!) 101/44  101/64 101/69  Pulse: 81 86 95 85  Resp: 19 18 17 18   Temp:  98.1 F (36.7 C) 98.2 F (36.8 C) 98.2 F (36.8 C)  TempSrc:  Oral Oral Oral  SpO2: 98% 96% 94% 93%    Intake/Output Summary (Last 24 hours) at 02/03/2017 1647 Last data filed at 02/03/2017 1500 Gross per 24 hour  Intake 1893.33  ml  Output -  Net 1893.33 ml   There were no vitals filed for this visit.  Examination:  General exam: Appears calm and comfortable ,Not in distress,average built Respiratory system: Bilateral equal air entry, normal vesicular breath sounds, no wheezes or crackles  Cardiovascular system: S1 & S2 heard, RRR. No JVD, murmurs, rubs, gallops or clicks. No pedal edema. Gastrointestinal system: Abdomen is nondistended, soft and nontender. No organomegaly or masses felt. Normal bowel sounds heard. Central nervous system: Alert and oriented. No focal neurological deficits. Extremities: No edema, no clubbing ,no cyanosis, distal peripheral pulses palpable. Skin: No rashes, lesions or ulcers,no icterus ,no pallor Psychiatry:  Not alert or oriented, nonverbal at baseline   Data Reviewed: I have personally reviewed following labs and imaging studies  CBC: Recent Labs  Lab 02/02/17 2131 02/03/17 0633  WBC 15.8* 12.0*  NEUTROABS 13.4*  --   HGB 11.9* 10.4*  HCT 36.7 31.9*  MCV 104.3* 103.2*  PLT 279 255   Basic Metabolic Panel: Recent Labs  Lab 02/02/17 2131 02/03/17 0633  NA 149* 150*  K 3.6 2.9*  CL 118* 120*  CO2 23 23  GLUCOSE 141* 98  BUN 26* 27*  CREATININE 1.59* 1.27*  CALCIUM 8.9 8.7*   GFR: CrCl cannot be calculated (Unknown ideal weight.). Liver Function Tests: Recent Labs  Lab 02/02/17 2131 02/03/17 0633  AST 27 39  ALT 20 27  ALKPHOS 106 93  BILITOT 0.5 0.6  PROT 7.4 6.5  ALBUMIN 2.7* 2.4*  No results for input(s): LIPASE, AMYLASE in the last 168 hours. No results for input(s): AMMONIA in the last 168 hours. Coagulation Profile: No results for input(s): INR, PROTIME in the last 168 hours. Cardiac Enzymes: No results for input(s): CKTOTAL, CKMB, CKMBINDEX, TROPONINI in the last 168 hours. BNP (last 3 results) No results for input(s): PROBNP in the last 8760 hours. HbA1C: No results for input(s): HGBA1C in the last 72 hours. CBG: No results for  input(s): GLUCAP in the last 168 hours. Lipid Profile: No results for input(s): CHOL, HDL, LDLCALC, TRIG, CHOLHDL, LDLDIRECT in the last 72 hours. Thyroid Function Tests: Recent Labs    02/03/17 0633  TSH 2.489   Anemia Panel: No results for input(s): VITAMINB12, FOLATE, FERRITIN, TIBC, IRON, RETICCTPCT in the last 72 hours. Sepsis Labs: No results for input(s): PROCALCITON, LATICACIDVEN in the last 168 hours.  Recent Results (from the past 240 hour(s))  Urine culture     Status: Abnormal   Collection Time: 01/27/17  2:52 PM  Result Value Ref Range Status   Specimen Description URINE, RANDOM  Final   Special Requests NONE  Final   Culture MULTIPLE SPECIES PRESENT, SUGGEST RECOLLECTION (A)  Final   Report Status 01/28/2017 FINAL  Final         Radiology Studies: Dg Chest 2 View  Result Date: 02/02/2017 CLINICAL DATA:  [age] year old female with shortness of breath. EXAM: CHEST  2 VIEW COMPARISON:  None. FINDINGS: Shallow inspiration with minimal bibasilar atelectasis. There is no focal consolidation, pleural effusion, pneumothorax. The cardiac silhouette is within normal limits. Mild prominence of the central vasculature may represent mild vascular congestion. No acute osseous pathology. IMPRESSION: Probable mild vascular congestion.  No focal consolidation. Electronically Signed   By: Elgie CollardArash  Radparvar M.D.   On: 02/02/2017 21:31        Scheduled Meds: . fludrocortisone  0.1 mg Oral Daily  . lamoTRIgine  100 mg Oral Daily  . levothyroxine  100 mcg Oral Daily  . liver oil-zinc oxide   Topical BID  . memantine  5 mg Oral BID  . montelukast  10 mg Oral Daily  . nabumetone  500 mg Oral Daily  . pantoprazole  40 mg Oral Daily  . potassium chloride  40 mEq Oral Q4H  . rivaroxaban  20 mg Oral Q supper  . sertraline  25 mg Oral Daily   Continuous Infusions: . dextrose    . levETIRAcetam Stopped (02/03/17 1104)  . sodium chloride       LOS: 1 day    Time spent: 25  minutes    Clester Chlebowski Salli QuarryAdhikari BK, MD [facility] Pager [phone]  If 7PM-7AM, please contact night-coverage [email or web address] Password [email or web address] 02/03/2017, 4:47 PM

## 2017-02-03 NOTE — Progress Notes (Signed)
Pt arrived to unit room 1509 via stretcher. VS taken, pt alert but nonverbal at baseline.pt in no distress, will continue to monitor and intervene appropriately.

## 2017-02-03 NOTE — H&P (Signed)
History and Physical    Brooke Palmer ZOX:[id number] DOB: 04-14-1960 DOA: 02/02/2017  Referring MD/NP/PA: Jeraldine Loots PCP: Patient, No Pcp Per   Outpatient Specialists: Unknown   Patient coming from: Group home  Chief Complaint: Shortness of breath  HPI: Brooke Palmer is a 56 y.o. female with medical history significant of cognitive delay with aphasia who presented with shortness of breath and weakness. Patient lives in a group home. Then noted patient having some cough and increased work of breathing. EMT were called and patient was brought to the ER. In the ER patient was found to have evidence of dehydration with acute kidney injury. She also has no evidence of hypoxemia and oxygen sats appears stable in the 90s on room air. Patient appears to be weak from dehydration more than anything else. She is being admitted for treatment. Unable to give history so history mainly obtained from EMS records.  ED Course: Patient has received IV fluids and evaluation for possible pneumonia  Review of Systems: Patient is aphasic so this is not obtainable  Past Medical History:  Diagnosis Date  . Arthritis    osteo  . DVT (deep venous thrombosis) (HCC)    bilateral legs  . Dysphasia   . Insomnia   . PTSD (post-traumatic stress disorder)   . Seizures (HCC)     History reviewed. No pertinent surgical history.   reports that  has never smoked. she has never used smokeless tobacco. She reports that she does not drink alcohol or use drugs.  No Known Allergies  No family history on file.   Prior to Admission medications   Medication Sig Start Date End Date Taking? Authorizing Provider  acetic acid (VOSOL) 2 % otic solution Place 4 drops into both ears 2 (two) times daily.   Yes [provider]  Calcium Carbonate-Vitamin D (OSCAL 500/200 D-3 PO) Take 1 tablet by mouth 2 (two) times daily.    Yes [provider]  Cholecalciferol (VITAMIN D3) 2000 units TABS Take 2,000 Units by  mouth daily after breakfast.   Yes [provider]  coal tar (NEUTROGENA T-GEL) 0.5 % shampoo Apply 1 application topically at bedtime as needed (scalp, dandruff).    Yes [provider]  docusate sodium (COLACE) 100 MG capsule Take 100 mg by mouth daily.   Yes [provider]  doxycycline (VIBRA-TABS) 100 MG tablet Take 100 mg by mouth 2 (two) times daily.   Yes [provider]  erythromycin ophthalmic ointment Place a 1/2 inch ribbon of ointment into the lower eyelid QID for one week. 01/27/17  Yes Alvira Monday, MD  fludrocortisone (FLORINEF) 0.1 MG tablet Take 0.1 mg by mouth daily. 12/12/16  Yes [provider]  lamoTRIgine (LAMICTAL) 100 MG tablet Take 100 mg by mouth daily. 12/12/16  Yes [provider]  levETIRAcetam (KEPPRA) 1000 MG tablet Take 1 tablet (1,000 mg total) by mouth 2 (two) times daily. 01/27/17  Yes Alvira Monday, MD  levothyroxine (SYNTHROID, LEVOTHROID) 100 MCG tablet Take 100 mcg by mouth daily. 12/12/16  Yes [provider]  Melatonin 3 MG TABS Take 3 mg by mouth daily.   Yes [provider]  memantine (NAMENDA) 5 MG tablet Take 5 mg by mouth 2 (two) times daily. 12/12/16  Yes [provider]  Menthol-Zinc Oxide (RISAMINE) 0.44-20.625 % OINT Apply 1 application topically 2 (two) times daily.   Yes [provider]  metroNIDAZOLE (METROCREAM) 0.75 % cream Apply 1 application topically 2 (two) times daily. 12/03/16  Yes [provider]  montelukast (SINGULAIR) 10 MG tablet Take 10 mg by mouth daily. 12/12/16  Yes [provider]  omeprazole (PRILOSEC) 20 MG capsule Take 20 mg by mouth daily. 12/12/16  Yes [provider]  rivaroxaban (XARELTO) 20 MG TABS tablet Take 20 mg by mouth daily with supper.   Yes [provider]  sertraline (ZOLOFT) 25 MG tablet Take 25 mg by mouth daily. 12/12/16  Yes [provider]    Physical  Exam: Vitals:   02/02/17 2331 02/03/17 0100 02/03/17 0130 02/03/17 0220  BP: (!) 82/41 111/85 (!) 101/44   Pulse: 91 (!) 48 81 86  Resp: 20 16 19 18   Temp:    98.1 F (36.7 C)  TempSrc:    Oral  SpO2: 95% (!) 85% 98% 96%      Constitutional: NAD, calm, comfortable Vitals:   02/02/17 2331 02/03/17 0100 02/03/17 0130 02/03/17 0220  BP: (!) 82/41 111/85 (!) 101/44   Pulse: 91 (!) 48 81 86  Resp: 20 16 19 18   Temp:    98.1 F (36.7 C)  TempSrc:    Oral  SpO2: 95% (!) 85% 98% 96%   Eyes: PERRL, lids and conjunctivae normal ENMT: Mucous membranes are moist. Posterior pharynx clear of any exudate or lesions.Normal dentition.  Neck: normal, supple, no masses, no thyromegaly Respiratory: clear to auscultation bilaterally, no wheezing, no crackles. Normal respiratory effort. No accessory muscle use.  Cardiovascular: Regular rate and rhythm, no murmurs / rubs / gallops. No extremity edema. 2+ pedal pulses. No carotid bruits.  Abdomen: no tenderness, no masses palpated. No hepatosplenomegaly. Bowel sounds positive.  Musculoskeletal: Obvious deformity in the hands and legs. no clubbing / cyanosis. No joint deformity upper and lower extremities. Good ROM, no contractures. Normal muscle tone.  Skin: no rashes, lesions, ulcers. No induration Neurologic: Patient has mental recommendation but moves all limbs. Psychiatric: Cognitive delay and the fascia    Labs on Admission: I have personally reviewed following labs and imaging studies  CBC: Recent Labs  Lab 01/27/17 1108 02/02/17 2131  WBC 10.4 15.8*  NEUTROABS 7.5 13.4*  HGB 12.5 11.9*  HCT 37.6 36.7  MCV 103.0* 104.3*  PLT 263 279   Basic Metabolic Panel: Recent Labs  Lab 01/27/17 1108 02/02/17 2131  NA 144 149*  K 3.4* 3.6  CL 112* 118*  CO2 26 23  GLUCOSE 97 141*  BUN 15 26*  CREATININE 0.77 1.59*  CALCIUM 8.7* 8.9   GFR: CrCl cannot be calculated (Unknown ideal weight.). Liver Function Tests: Recent Labs  Lab  01/27/17 1108 02/02/17 2131  AST 18 27  ALT 21 20  ALKPHOS 114 106  BILITOT 0.8 0.5  PROT 7.2 7.4  ALBUMIN 3.0* 2.7*   No results for input(s): LIPASE, AMYLASE in the last 168 hours. No results for input(s): AMMONIA in the last 168 hours. Coagulation Profile: No results for input(s): INR, PROTIME in the last 168 hours. Cardiac Enzymes: No results for input(s): CKTOTAL, CKMB, CKMBINDEX, TROPONINI in the last 168 hours. BNP (last 3 results) No results for input(s): PROBNP in the last 8760 hours. HbA1C: No results for input(s): HGBA1C in the last 72 hours. CBG: No results for input(s): GLUCAP in the last 168 hours. Lipid Profile: No results for input(s): CHOL, HDL, LDLCALC, TRIG, CHOLHDL, LDLDIRECT in the last 72 hours. Thyroid Function Tests: No results for input(s): TSH, T4TOTAL, FREET4, T3FREE, THYROIDAB in the last 72 hours. Anemia Panel: No results for input(s): VITAMINB12, FOLATE,  FERRITIN, TIBC, IRON, RETICCTPCT in the last 72 hours. Urine analysis:    Component Value Date/Time   COLORURINE YELLOW (A) 01/27/2017 1452   APPEARANCEUR CLOUDY (A) 01/27/2017 1452   LABSPEC 1.015 01/27/2017 1452   PHURINE 7.0 01/27/2017 1452   GLUCOSEU NEGATIVE 01/27/2017 1452   HGBUR SMALL (A) 01/27/2017 1452   BILIRUBINUR NEGATIVE 01/27/2017 1452   KETONESUR NEGATIVE 01/27/2017 1452   PROTEINUR NEGATIVE 01/27/2017 1452   NITRITE NEGATIVE 01/27/2017 1452   LEUKOCYTESUR NEGATIVE 01/27/2017 1452   Sepsis Labs: @LABRCNTIP (procalcitonin:4,lacticidven:4) ) Recent Results (from the past 240 hour(s))  Urine culture     Status: Abnormal   Collection Time: 01/27/17  2:52 PM  Result Value Ref Range Status   Specimen Description URINE, RANDOM  Final   Special Requests NONE  Final   Culture MULTIPLE SPECIES PRESENT, SUGGEST RECOLLECTION (A)  Final   Report Status 01/28/2017 FINAL  Final     Radiological Exams on Admission: Dg Chest 2 View  Result Date: 02/02/2017 CLINICAL DATA:   56 year old female with shortness of breath. EXAM: CHEST  2 VIEW COMPARISON:  None. FINDINGS: Shallow inspiration with minimal bibasilar atelectasis. There is no focal consolidation, pleural effusion, pneumothorax. The cardiac silhouette is within normal limits. Mild prominence of the central vasculature may represent mild vascular congestion. No acute osseous pathology. IMPRESSION: Probable mild vascular congestion.  No focal consolidation. Electronically Signed   By: Elgie CollardArash  Radparvar M.D.   On: 02/02/2017 21:31    EKG: Independently reviewed.   Assessment/Plan Principal Problem:   ARF (acute renal failure) (HCC) Active Problems:   Dehydration   Leucocytosis   Seizure disorder (HCC)   Hypothyroid   GERD (gastroesophageal reflux disease)   DVT (deep venous thrombosis) (HCC)   #1 acute kidney injury: Patient will be aggressively hydrated. Her oral intake may not be good. I suspect prerenal azotemia. Continue monitoring renal function. May consider renal ultrasound if renal function continues to worsen.  #2 dehydration: Hydrate patient aggressively.  #3 hypothyroidism: Check TSH and continue with monitoring  #4 seizure disorder: Continue home regimen of medications.  #5 GERD: Continue PPIs  #6 cognitive delay: Chronic patient lives in a group home. She'll return to group home when stable  DVT prophylaxis: Lovenox   Code Status: Full  Family Communication: No family available  Disposition Plan: Back to group home   Consults called: none   Admission status: inpatient   Severity of Illness: The appropriate patient status for this patient is INPATIENT. Inpatient status is judged to be reasonable and necessary in order to provide the required intensity of service to ensure the patient's safety. The patient's presenting symptoms, physical exam findings, and initial radiographic and laboratory data in the context of their chronic comorbidities is felt to place them at high risk for  further clinical deterioration. Furthermore, it is not anticipated that the patient will be medically stable for discharge from the hospital within 2 midnights of admission. The following factors support the patient status of inpatient.   " The patient's presenting symptoms include weakness and shortness of breath. " The worrisome physical exam findings include aphasia " The initial radiographic and laboratory data are worrisome because of increased BUN and creatinine. " The chronic co-morbidities include mental today she.   * I certify that at the point of admission it is my clinical judgment that the patient will require inpatient hospital care spanning beyond 2 midnights from the point of admission due to high intensity of service, high risk for further  deterioration and high frequency of surveillance required.Lonia Blood MD [facility] Pager [phone]  If 7PM-7AM, please contact night-coverage [email or web address] Password [facility]  02/03/2017, 3:29 AM

## 2017-02-04 ENCOUNTER — Other Ambulatory Visit: Payer: Self-pay

## 2017-02-04 LAB — BASIC METABOLIC PANEL
Anion gap: 6 (ref 5–15)
BUN: 19 mg/dL (ref 6–20)
CHLORIDE: 117 mmol/L — AB (ref 101–111)
CO2: 23 mmol/L (ref 22–32)
CREATININE: 0.81 mg/dL (ref 0.44–1.00)
Calcium: 8.3 mg/dL — ABNORMAL LOW (ref 8.9–10.3)
GFR calc Af Amer: >60 mL/min (ref >60)
GFR calc non Af Amer: >60 mL/min (ref >60)
Glucose, Bld: 97 mg/dL (ref 65–99)
Potassium: 3.6 mmol/L (ref 3.5–5.1)
SODIUM: 146 mmol/L — AB (ref 135–145)

## 2017-02-04 LAB — CBC WITH DIFFERENTIAL/PLATELET
Basophils Absolute: 0.1 10*3/uL (ref 0.0–0.1)
Basophils Relative: 1 %
EOS ABS: 0.2 10*3/uL (ref 0.0–0.7)
EOS PCT: 3 %
HCT: 31.6 % — ABNORMAL LOW (ref 36.0–46.0)
Hemoglobin: 10.3 g/dL — ABNORMAL LOW (ref 12.0–15.0)
LYMPHS ABS: 1.7 10*3/uL (ref 0.7–4.0)
Lymphocytes Relative: 23 %
MCH: 33.7 pg (ref 26.0–34.0)
MCHC: 32.6 g/dL (ref 30.0–36.0)
MCV: 103.3 fL — ABNORMAL HIGH (ref 78.0–100.0)
Monocytes Absolute: 0.8 10*3/uL (ref 0.1–1.0)
Monocytes Relative: 11 %
Neutro Abs: 4.7 10*3/uL (ref 1.7–7.7)
Neutrophils Relative %: 62 %
PLATELETS: 256 10*3/uL (ref 150–400)
RBC: 3.06 MIL/uL — AB (ref 3.87–5.11)
RDW: 14.9 % (ref 11.5–15.5)
WBC: 7.5 10*3/uL (ref 4.0–10.5)

## 2017-02-04 LAB — MAGNESIUM: MAGNESIUM: 2.1 mg/dL (ref 1.7–2.4)

## 2017-02-04 MED ORDER — RESOURCE THICKENUP CLEAR PO POWD
ORAL | Status: DC | PRN
  Filled 2017-02-04: qty 125

## 2017-02-04 NOTE — Progress Notes (Signed)
fyi-patient from [facility] Date:  February 04, 2017 Chart reviewed for concurrent status and case management needs.  Will continue to follow patient progress.  Discharge Planning: following for needs  Expected discharge date: December 247 2018  Marcelle SmilingRhonda Arlenis Blaydes, BSN, [place]RN3, [place]CCM   [phone]

## 2017-02-04 NOTE — Progress Notes (Addendum)
PROGRESS NOTE    Brooke Palmer  ZOX:[id number] DOB: 02/01/1961 DOA: 02/02/2017 PCP: Lucretia Fieldoyals, [place] M   Brief Narrative:   Assessment & Plan:   Principal Problem:   ARF (acute renal failure) (HCC) Active Problems:   Dehydration   Leucocytosis   Seizure disorder (HCC)   Hypothyroid   GERD (gastroesophageal reflux disease)   DVT (deep venous thrombosis) (HCC)   Patient is a 56 year old female with past medical history of cognitive delay with aphasia who presented to the emergency department with shortness of breath and weakness.  Patient is from group home.  In the ER patient was found to have dehydration with acute kidney injury and was admitted for further management.  Acute kidney injury:Resolved.  Presented with acute kidney injury. We will continue to monitor her kidney function.  Hyponatremia: Sodium 146 today.  Continue  on D5W  Hypokalemia: Supplemented.  We will continue to monitor the levels.    Leukocytosis: Resolved.  We will continue to monitor trend  Cognitive impairment/aphasia: On baseline. Patient is from group home  Seizure disorder: On Keppra and Lamictal.  History of DVT: On Xarelto  GERD: Continue PPI  Patient is medically stable to be  discharged to group home likely tomorrow.  Social worker consulted  DVT prophylaxis: Xarelto Code Status:  Full Family Communication:  None Disposition Plan:  Group Home   Consultants: None  Procedures: None  Antimicrobials: None  Subjective: Patient seen and examined the patient this morning.  Looks comfortable.  No new issues or events but she was reported to have difficulty swallowing this morning.  We will consult speech Objective: Vitals:   02/03/17 2127 02/04/17 0508 02/04/17 1352 02/04/17 1355  BP: (!) 110/58 111/66 (!) 115/46   Pulse: 76 78 81   Resp: 18 18 18    Temp: 98.3 F (36.8 C) 98.4 F (36.9 C) 99.5 F (37.5 C)   TempSrc: Oral Oral Axillary   SpO2: 90% 94% (!) 88% 93%     Intake/Output Summary (Last 24 hours) at 02/04/2017 1551 Last data filed at 02/04/2017 1500 Gross per 24 hour  Intake 1556.67 ml  Output 1150 ml  Net 406.67 ml   There were no vitals filed for this visit.  Examination:  General exam: Appears calm and comfortable ,Not in distress,average built Respiratory system: Bilateral equal air entry, normal vesicular breath sounds, no wheezes or crackles  Cardiovascular system: S1 & S2 heard, RRR. No JVD, murmurs, rubs, gallops or clicks. No pedal edema. Gastrointestinal system: Abdomen is nondistended, soft and nontender. No organomegaly or masses felt. Normal bowel sounds heard. Central nervous system:Not  Alert and oriented. No focal neurological deficits. Extremities: No edema, no clubbing ,no cyanosis, distal peripheral pulses palpable. Skin: No cyanosis,No pallor,No Rash,No Ulcer Psychiatry: Not alert or oriented  Data Reviewed: I have personally reviewed following labs and imaging studies  CBC: Recent Labs  Lab 02/02/17 2131 02/03/17 0633 02/04/17 0539  WBC 15.8* 12.0* 7.5  NEUTROABS 13.4*  --  4.7  HGB 11.9* 10.4* 10.3*  HCT 36.7 31.9* 31.6*  MCV 104.3* 103.2* 103.3*  PLT 279 255 256   Basic Metabolic Panel: Recent Labs  Lab 02/02/17 2131 02/03/17 0633 02/04/17 0539  NA 149* 150* 146*  K 3.6 2.9* 3.6  CL 118* 120* 117*  CO2 23 23 23   GLUCOSE 141* 98 97  BUN 26* 27* 19  CREATININE 1.59* 1.27* 0.81  CALCIUM 8.9 8.7* 8.3*  MG  --   --  2.1   GFR: CrCl  cannot be calculated (Unknown ideal weight.). Liver Function Tests: Recent Labs  Lab 02/02/17 2131 02/03/17 0633  AST 27 39  ALT 20 27  ALKPHOS 106 93  BILITOT 0.5 0.6  PROT 7.4 6.5  ALBUMIN 2.7* 2.4*   No results for input(s): LIPASE, AMYLASE in the last 168 hours. No results for input(s): AMMONIA in the last 168 hours. Coagulation Profile: No results for input(s): INR, PROTIME in the last 168 hours. Cardiac Enzymes: No results for input(s): CKTOTAL,  CKMB, CKMBINDEX, TROPONINI in the last 168 hours. BNP (last 3 results) No results for input(s): PROBNP in the last 8760 hours. HbA1C: No results for input(s): HGBA1C in the last 72 hours. CBG: No results for input(s): GLUCAP in the last 168 hours. Lipid Profile: No results for input(s): CHOL, HDL, LDLCALC, TRIG, CHOLHDL, LDLDIRECT in the last 72 hours. Thyroid Function Tests: Recent Labs    02/03/17 0633  TSH 2.489   Anemia Panel: No results for input(s): VITAMINB12, FOLATE, FERRITIN, TIBC, IRON, RETICCTPCT in the last 72 hours. Sepsis Labs: No results for input(s): PROCALCITON, LATICACIDVEN in the last 168 hours.  Recent Results (from the past 240 hour(s))  Urine culture     Status: Abnormal   Collection Time: 01/27/17  2:52 PM  Result Value Ref Range Status   Specimen Description URINE, RANDOM  Final   Special Requests NONE  Final   Culture MULTIPLE SPECIES PRESENT, SUGGEST RECOLLECTION (A)  Final   Report Status 01/28/2017 FINAL  Final         Radiology Studies: Dg Chest 2 View  Result Date: 02/02/2017 CLINICAL DATA:  56 year old female with shortness of breath. EXAM: CHEST  2 VIEW COMPARISON:  None. FINDINGS: Shallow inspiration with minimal bibasilar atelectasis. There is no focal consolidation, pleural effusion, pneumothorax. The cardiac silhouette is within normal limits. Mild prominence of the central vasculature may represent mild vascular congestion. No acute osseous pathology. IMPRESSION: Probable mild vascular congestion.  No focal consolidation. Electronically Signed   By: Elgie CollardArash  Radparvar M.D.   On: 02/02/2017 21:31        Scheduled Meds: . fludrocortisone  0.1 mg Oral Daily  . lamoTRIgine  100 mg Oral Daily  . levETIRAcetam  1,000 mg Oral BID  . levothyroxine  100 mcg Oral Daily  . liver oil-zinc oxide   Topical BID  . memantine  5 mg Oral BID  . montelukast  10 mg Oral Daily  . nabumetone  500 mg Oral Daily  . pantoprazole  40 mg Oral Daily  .  rivaroxaban  20 mg Oral Q supper  . sertraline  25 mg Oral Daily   Continuous Infusions: . dextrose 100 mL/hr at 02/03/17 1659  . sodium chloride       LOS: 2 days    Time spent: 25 minutes    Anjel Perfetti Salli QuarryAdhikari BK, MD [facility] Pager [phone]  If 7PM-7AM, please contact night-coverage [email or web address] Password [email or web address] 02/04/2017, 3:51 PM

## 2017-02-05 LAB — BASIC METABOLIC PANEL
ANION GAP: 6 (ref 5–15)
BUN: 17 mg/dL (ref 6–20)
CALCIUM: 8.1 mg/dL — AB (ref 8.9–10.3)
CO2: 24 mmol/L (ref 22–32)
Chloride: 111 mmol/L (ref 101–111)
Creatinine, Ser: 0.76 mg/dL (ref 0.44–1.00)
GFR calc Af Amer: >60 mL/min (ref >60)
GFR calc non Af Amer: >60 mL/min (ref >60)
GLUCOSE: 97 mg/dL (ref 65–99)
Potassium: 3.3 mmol/L — ABNORMAL LOW (ref 3.5–5.1)
Sodium: 141 mmol/L (ref 135–145)

## 2017-02-05 MED ORDER — POTASSIUM CHLORIDE CRYS ER 20 MEQ PO TBCR
40.0000 meq | EXTENDED_RELEASE_TABLET | Freq: Once | ORAL | Status: AC
  Administered 2017-02-05: 40 meq via ORAL
  Filled 2017-02-05: qty 2

## 2017-02-05 MED ORDER — POTASSIUM CHLORIDE CRYS ER 20 MEQ PO TBCR
20.0000 meq | EXTENDED_RELEASE_TABLET | Freq: Every day | ORAL | Status: DC
  Administered 2017-02-06 – 2017-02-07 (×2): 20 meq via ORAL
  Filled 2017-02-05 (×2): qty 1

## 2017-02-05 NOTE — Progress Notes (Signed)
PROGRESS NOTE    Brooke AngelCarolyn Palmer  ZOX:[id number] DOB: 05/13/1960 DOA: 02/02/2017 PCP: Lucretia Fieldoyals, [place] M   Brief Narrative:   Assessment & Plan:   Principal Problem:   ARF (acute renal failure) (HCC) Active Problems:   Dehydration   Leucocytosis   Seizure disorder (HCC)   Hypothyroid   GERD (gastroesophageal reflux disease)   DVT (deep venous thrombosis) (HCC)   Patient is a 56 year old female with past medical history of cognitive delay with aphasia who presented to the emergency department with shortness of breath and weakness.  Patient is from group home.  In the ER patient was found to have dehydration with acute kidney injury and was admitted for further management.  Acute kidney injury:Resolved.  Presented with acute kidney injury. We will continue to monitor her kidney function.  Hypernatremia: Sodium 141 today.  IV fluids discontinued.  Patient having food by mouth.  There was some concern for swallowing difficulty so his speech was consulted.  Waiting for evaluation  Hypokalemia: Supplemented.  We will continue to monitor the levels.    Leukocytosis: Resolved.  We will continue to monitor trend  Cognitive impairment/aphasia: On baseline. Patient is from group home  Seizure disorder: On Keppra and Lamictal.  History of DVT: On Xarelto  GERD: Continue PPI  Patient is medically stable to be  discharged to group home likely tomorrow.  Social worker consulted.  DVT prophylaxis: Xarelto Code Status:  Full Family Communication:  None Disposition Plan:  Group Home   Consultants: None  Procedures: None  Antimicrobials: None  Subjective:  Patient seen and examined the patient this morning.  Remains comfortable.  Cheerful and laughing.  No new events/issues.  Objective: Vitals:   02/04/17 1352 02/04/17 1355 02/04/17 2101 02/05/17 0426  BP: (!) 115/46  126/65 (!) 109/59  Pulse: 81  70 66  Resp: 18  16 18   Temp: 99.5 F (37.5 C)  98.5 F (36.9 C) 98.4 F  (36.9 C)  TempSrc: Axillary  Oral Oral  SpO2: (!) 88% 93% 97% 94%    Intake/Output Summary (Last 24 hours) at 02/05/2017 1301 Last data filed at 02/05/2017 [id number] Gross per 24 hour  Intake 2090.42 ml  Output -  Net 2090.42 ml   There were no vitals filed for this visit.  Examination:  General exam: Appears calm and comfortable ,Not in distress,average built Respiratory system: Bilateral equal air entry, normal vesicular breath sounds, no wheezes or crackles  Cardiovascular system: S1 & S2 heard, RRR. No JVD, murmurs, rubs, gallops or clicks. No pedal edema. Gastrointestinal system: Abdomen is nondistended, soft and nontender. No organomegaly or masses felt. Normal bowel sounds heard. Central nervous system: Not oriented, nonverbal on baseline Extremities: No edema, no clubbing ,no cyanosis, distal peripheral pulses palpable. Skin: No cyanosis,No pallor,No Rash,No Ulcer Psychiatry: Not oriented  Data Reviewed: I have personally reviewed following labs and imaging studies  CBC: Recent Labs  Lab 02/02/17 2131 02/03/17 0633 02/04/17 0539  WBC 15.8* 12.0* 7.5  NEUTROABS 13.4*  --  4.7  HGB 11.9* 10.4* 10.3*  HCT 36.7 31.9* 31.6*  MCV 104.3* 103.2* 103.3*  PLT 279 255 256   Basic Metabolic Panel: Recent Labs  Lab 02/02/17 2131 02/03/17 0633 02/04/17 0539 02/05/17 0539  NA 149* 150* 146* 141  K 3.6 2.9* 3.6 3.3*  CL 118* 120* 117* 111  CO2 23 23 23 24   GLUCOSE 141* 98 97 97  BUN 26* 27* 19 17  CREATININE 1.59* 1.27* 0.81 0.76  CALCIUM 8.9 8.7*  8.3* 8.1*  MG  --   --  2.1  --    GFR: CrCl cannot be calculated (Unknown ideal weight.). Liver Function Tests: Recent Labs  Lab 02/02/17 2131 02/03/17 0633  AST 27 39  ALT 20 27  ALKPHOS 106 93  BILITOT 0.5 0.6  PROT 7.4 6.5  ALBUMIN 2.7* 2.4*   No results for input(s): LIPASE, AMYLASE in the last 168 hours. No results for input(s): AMMONIA in the last 168 hours. Coagulation Profile: No results for input(s):  INR, PROTIME in the last 168 hours. Cardiac Enzymes: No results for input(s): CKTOTAL, CKMB, CKMBINDEX, TROPONINI in the last 168 hours. BNP (last 3 results) No results for input(s): PROBNP in the last 8760 hours. HbA1C: No results for input(s): HGBA1C in the last 72 hours. CBG: No results for input(s): GLUCAP in the last 168 hours. Lipid Profile: No results for input(s): CHOL, HDL, LDLCALC, TRIG, CHOLHDL, LDLDIRECT in the last 72 hours. Thyroid Function Tests: Recent Labs    02/03/17 0633  TSH 2.489   Anemia Panel: No results for input(s): VITAMINB12, FOLATE, FERRITIN, TIBC, IRON, RETICCTPCT in the last 72 hours. Sepsis Labs: No results for input(s): PROCALCITON, LATICACIDVEN in the last 168 hours.  Recent Results (from the past 240 hour(s))  Urine culture     Status: Abnormal   Collection Time: 01/27/17  2:52 PM  Result Value Ref Range Status   Specimen Description URINE, RANDOM  Final   Special Requests NONE  Final   Culture MULTIPLE SPECIES PRESENT, SUGGEST RECOLLECTION (A)  Final   Report Status 01/28/2017 FINAL  Final         Radiology Studies: No results found.      Scheduled Meds: . fludrocortisone  0.1 mg Oral Daily  . lamoTRIgine  100 mg Oral Daily  . levETIRAcetam  1,000 mg Oral BID  . levothyroxine  100 mcg Oral Daily  . liver oil-zinc oxide   Topical BID  . memantine  5 mg Oral BID  . montelukast  10 mg Oral Daily  . nabumetone  500 mg Oral Daily  . pantoprazole  40 mg Oral Daily  . [START ON 02/06/2017] potassium chloride  20 mEq Oral Daily  . rivaroxaban  20 mg Oral Q supper  . sertraline  25 mg Oral Daily   Continuous Infusions:    LOS: 3 days    Time spent: 25 minutes    Zakhi Dupre Salli QuarryAdhikari BK, MD [facility] Pager [phone]  If 7PM-7AM, please contact night-coverage [email or web address] Password [email or web address] 02/05/2017, 1:01 PM

## 2017-02-06 LAB — BASIC METABOLIC PANEL
ANION GAP: 6 (ref 5–15)
BUN: 17 mg/dL (ref 6–20)
CALCIUM: 8.1 mg/dL — AB (ref 8.9–10.3)
CO2: 22 mmol/L (ref 22–32)
Chloride: 112 mmol/L — ABNORMAL HIGH (ref 101–111)
Creatinine, Ser: 0.74 mg/dL (ref 0.44–1.00)
GFR calc Af Amer: >60 mL/min (ref >60)
GFR calc non Af Amer: >60 mL/min (ref >60)
GLUCOSE: 93 mg/dL (ref 65–99)
Potassium: 3.4 mmol/L — ABNORMAL LOW (ref 3.5–5.1)
Sodium: 140 mmol/L (ref 135–145)

## 2017-02-06 MED ORDER — POTASSIUM CHLORIDE CRYS ER 20 MEQ PO TBCR
20.0000 meq | EXTENDED_RELEASE_TABLET | Freq: Once | ORAL | Status: AC
  Administered 2017-02-06: 20 meq via ORAL
  Filled 2017-02-06: qty 1

## 2017-02-06 NOTE — Evaluation (Signed)
Clinical/Bedside Swallow Evaluation Patient Details  Name: Brooke Palmer MRN: [id number] Date of Birth: 12/10/1960  Today's Date: 02/06/2017 Time: SLP Start Time (ACUTE ONLY): 1250 SLP Stop Time (ACUTE ONLY): 1305 SLP Time Calculation (min) (ACUTE ONLY): 15 min  Past Medical History:  Past Medical History:  Diagnosis Date  . Arthritis    osteo  . DVT (deep venous thrombosis) (HCC)    bilateral legs  . Dysphasia   . Insomnia   . PTSD (post-traumatic stress disorder)   . Seizures (HCC)    Past Surgical History: History reviewed. No pertinent surgical history. HPI:      Assessment / Plan / Recommendation Clinical Impression  Pt currently lethargic but did awaken briefly to accept po intake.  Limited assessment done due to mentation.   Note congested cough at baseline and decreased labial closure on left *pt leaning left.  Observed pt with few boluses of thin and nectar boluses.  She helped to hold her own cup which facilitated swallow. Delayed swallow noted with subtle throat clearing = no overt indication of airway compromise. Did not provide further po due to pt's sleepiness.  Note for breakfast pt consumed applesauce and pudding.  At this time, recommend to continue diet with strict precautions.  Puree likely easier for pt to manage at this time due to mentation.  SLP will follow up to assure diet tolerance and for caregiver information.   Note pt CXR upon admit negative for acute event and pt with temp of 99 today.      SLP Visit Diagnosis: Dysphagia, oral phase (R13.11)    Aspiration Risk  Moderate aspiration risk;Mild aspiration risk    Diet Recommendation Thin liquid;Dysphagia 1 (Puree)   Liquid Administration via: Cup Medication Administration: Whole meds with puree Supervision: Staff to assist with self feeding;Full supervision/cueing for compensatory strategies Compensations: Minimize environmental distractions;Slow rate;Small sips/bites;Other (Comment)(assure pt swallows  before giving more, have pt help hold own cup and self feed as able) Postural Changes: Seated upright at 90 degrees;Remain upright for at least 30 minutes after po intake    Other  Recommendations Oral Care Recommendations: Oral care before and after PO   Follow up Recommendations        Frequency and Duration min 1 x/week  1 week       Prognosis Prognosis for Safe Diet Advancement: Fair Barriers to Reach Goals: Cognitive deficits      Swallow Study   General      Oral/Motor/Sensory Function Overall Oral Motor/Sensory Function: Generalized oral weakness(pt with decreased labial seal on left, leaning to the left in the bed)   Ice Chips Ice chips: Not tested   Thin Liquid Thin Liquid: Impaired Presentation: Spoon Oral Phase Impairments: Poor awareness of bolus Oral Phase Functional Implications: Prolonged oral transit Pharyngeal  Phase Impairments: Suspected delayed Swallow    Nectar Thick Nectar Thick Liquid: Impaired Presentation: Cup;Spoon Oral Phase Impairments: Poor awareness of bolus Oral phase functional implications: Prolonged oral transit Pharyngeal Phase Impairments: Suspected delayed Swallow;Throat Clearing - Delayed   Honey Thick Honey Thick Liquid: Not tested   Puree Puree: Not tested due to pt not opening oral cavity adequately to accept  Solid   GO   Solid: Not tested        Chales AbrahamsKimball, Terena Bohan Ann 02/06/2017,1:27 PM Donavan Burnetamara Harim Bi, MS [facility] SLP [phone]

## 2017-02-06 NOTE — Progress Notes (Signed)
PROGRESS NOTE    Darrol AngelCarolyn Hobart  ZOX:[id number] DOB: 08/13/1960 DOA: 02/02/2017 PCP: Lucretia Fieldoyals, [place] M   Brief Narrative:   Assessment & Plan:   Principal Problem:   ARF (acute renal failure) (HCC) Active Problems:   Dehydration   Leucocytosis   Seizure disorder (HCC)   Hypothyroid   GERD (gastroesophageal reflux disease)   DVT (deep venous thrombosis) (HCC)   Patient is a 56 year old female with past medical history of cognitive delay with aphasia who presented to the emergency department with shortness of breath and weakness.  Patient is from group home.  In the ER patient was found to have dehydration with acute kidney injury and was admitted for further management.  Acute kidney injury:Resolved.  Presented with acute kidney injury. We will continue to monitor her kidney function.  Hypernatremia: Sodium 140  today.  IV fluids discontinued.    Dysphagia:Patient having food by mouth.  There was some concern for swallowing difficulty so  speech was consulted.  Recommended thin liquids with dysphagia 1 diet Having some chest congestion.  Will check a chest x-ray in the morning.  Hypokalemia: Supplemented.  We will continue to monitor the levels.    Leukocytosis: Resolved.  We will continue to monitor trend  Cognitive impairment/aphasia: On baseline. Patient is from group home  Seizure disorder: On Keppra and Lamictal.  History of DVT: On Xarelto  GERD: Continue PPI  Patient is medically stable to be  discharged to group home likely tomorrow.  Social worker consulted.  DVT prophylaxis: Xarelto Code Status:  Full Family Communication:  None Disposition Plan:  Group Home   Consultants: None  Procedures: None  Antimicrobials: None  Subjective:  Patient seen and examined the bedside this morning.  Remains comfortable.  She was having some cough and congestion.  Objective: Vitals:   02/05/17 1422 02/05/17 2130 02/06/17 0604 02/06/17 1446  BP: (!) 98/47 (!)  108/55 (!) 117/52 104/62  Pulse: 63 67 72 74  Resp: 18 17 18 18   Temp: 97.9 F (36.6 C) 98.1 F (36.7 C) 99 F (37.2 C) 99.3 F (37.4 C)  TempSrc: Oral Oral Oral Oral  SpO2: 96% 96% 94% 94%    Intake/Output Summary (Last 24 hours) at 02/06/2017 1638 Last data filed at 02/06/2017 1446 Gross per 24 hour  Intake 360 ml  Output 1650 ml  Net -1290 ml   There were no vitals filed for this visit.  Examination:  General exam: Appears calm and comfortable ,Not in distress,average built Respiratory system: Bilateral equal air entry, normal vesicular breath sounds, no wheezes or crackles  Cardiovascular system: S1 & S2 heard, RRR. No JVD, murmurs, rubs, gallops or clicks. No pedal edema. Gastrointestinal system: Abdomen is nondistended, soft and nontender. No organomegaly or masses felt. Normal bowel sounds heard. Central nervous system: Not oriented, nonverbal on baseline Extremities: No edema, no clubbing ,no cyanosis, distal peripheral pulses palpable. Skin: No cyanosis,No pallor,No Rash,No Ulcer Psychiatry: Not oriented  Data Reviewed: I have personally reviewed following labs and imaging studies  CBC: Recent Labs  Lab 02/02/17 2131 02/03/17 0633 02/04/17 0539  WBC 15.8* 12.0* 7.5  NEUTROABS 13.4*  --  4.7  HGB 11.9* 10.4* 10.3*  HCT 36.7 31.9* 31.6*  MCV 104.3* 103.2* 103.3*  PLT 279 255 256   Basic Metabolic Panel: Recent Labs  Lab 02/02/17 2131 02/03/17 0633 02/04/17 0539 02/05/17 0539 02/06/17 0448  NA 149* 150* 146* 141 140  K 3.6 2.9* 3.6 3.3* 3.4*  CL 118* 120* 117* 111  112*  CO2 23 23 23 24 22   GLUCOSE 141* 98 97 97 93  BUN 26* 27* 19 17 17   CREATININE 1.59* 1.27* 0.81 0.76 0.74  CALCIUM 8.9 8.7* 8.3* 8.1* 8.1*  MG  --   --  2.1  --   --    GFR: CrCl cannot be calculated (Unknown ideal weight.). Liver Function Tests: Recent Labs  Lab 02/02/17 2131 02/03/17 0633  AST 27 39  ALT 20 27  ALKPHOS 106 93  BILITOT 0.5 0.6  PROT 7.4 6.5  ALBUMIN  2.7* 2.4*   No results for input(s): LIPASE, AMYLASE in the last 168 hours. No results for input(s): AMMONIA in the last 168 hours. Coagulation Profile: No results for input(s): INR, PROTIME in the last 168 hours. Cardiac Enzymes: No results for input(s): CKTOTAL, CKMB, CKMBINDEX, TROPONINI in the last 168 hours. BNP (last 3 results) No results for input(s): PROBNP in the last 8760 hours. HbA1C: No results for input(s): HGBA1C in the last 72 hours. CBG: No results for input(s): GLUCAP in the last 168 hours. Lipid Profile: No results for input(s): CHOL, HDL, LDLCALC, TRIG, CHOLHDL, LDLDIRECT in the last 72 hours. Thyroid Function Tests: No results for input(s): TSH, T4TOTAL, FREET4, T3FREE, THYROIDAB in the last 72 hours. Anemia Panel: No results for input(s): VITAMINB12, FOLATE, FERRITIN, TIBC, IRON, RETICCTPCT in the last 72 hours. Sepsis Labs: No results for input(s): PROCALCITON, LATICACIDVEN in the last 168 hours.  No results found for this or any previous visit (from the past 240 hour(s)).       Radiology Studies: No results found.      Scheduled Meds: . fludrocortisone  0.1 mg Oral Daily  . lamoTRIgine  100 mg Oral Daily  . levETIRAcetam  1,000 mg Oral BID  . levothyroxine  100 mcg Oral Daily  . liver oil-zinc oxide   Topical BID  . memantine  5 mg Oral BID  . montelukast  10 mg Oral Daily  . nabumetone  500 mg Oral Daily  . pantoprazole  40 mg Oral Daily  . potassium chloride  20 mEq Oral Daily  . rivaroxaban  20 mg Oral Q supper  . sertraline  25 mg Oral Daily   Continuous Infusions:    LOS: 4 days    Time spent: 25 minutes    Ankita Newcomer Salli QuarryAdhikari BK, MD [facility] Pager [phone]  If 7PM-7AM, please contact night-coverage [email or web address] Password [email or web address] 02/06/2017, 4:38 PM

## 2017-02-06 NOTE — Progress Notes (Signed)
Temp at 0615 is 99. Will continue to monitor and relay the message to dayshift to monitor as well.

## 2017-02-06 NOTE — Progress Notes (Signed)
Pt's temp is 100.1. Will continue to monitor and notify MD if needed.

## 2017-02-07 ENCOUNTER — Inpatient Hospital Stay (HOSPITAL_COMMUNITY): Payer: Medicare Other

## 2017-02-07 MED ORDER — LEVOFLOXACIN 750 MG PO TABS: 750.0000 mg | ORAL_TABLET | Freq: Every day | ORAL | 0 refills | Status: DC

## 2017-02-07 MED ORDER — IBUPROFEN 200 MG PO TABS
400.0000 mg | ORAL_TABLET | Freq: Four times a day (QID) | ORAL | Status: DC | PRN
  Administered 2017-02-07: 400 mg via ORAL
  Filled 2017-02-07: qty 2

## 2017-02-07 MED ORDER — POTASSIUM CHLORIDE CRYS ER 20 MEQ PO TBCR
20.0000 meq | EXTENDED_RELEASE_TABLET | Freq: Once | ORAL | Status: AC
  Administered 2017-02-07: 20 meq via ORAL
  Filled 2017-02-07: qty 1

## 2017-02-07 MED ORDER — POTASSIUM CHLORIDE CRYS ER 20 MEQ PO TBCR: 20.0000 meq | EXTENDED_RELEASE_TABLET | Freq: Every day | ORAL | 0 refills | Status: DC

## 2017-02-07 MED ORDER — LEVOFLOXACIN 750 MG PO TABS
750.0000 mg | ORAL_TABLET | Freq: Every day | ORAL | Status: DC
  Administered 2017-02-07: 750 mg via ORAL
  Filled 2017-02-07: qty 1

## 2017-02-07 NOTE — Progress Notes (Signed)
Temp is down to 99.4

## 2017-02-07 NOTE — Discharge Summary (Signed)
Physician Discharge Summary  Brooke AngelCarolyn Palmer [id number] DOB: 06/02/1960 DOA: 02/02/2017  PCP: Lucretia Fieldoyals, [place] M  Admit date: 02/02/2017 Discharge date: 02/07/2017  Admitted From: Group Home Disposition:  Group Home  Recommendations for Outpatient Follow-up:  1. Follow up with PCP in 1 weeks 2. Please obtain BMP/CBC in one week   Discharge Condition None CODE STATUS: Full Diet recommendation: Dysphagia 1 with thin liquid  Brief/Interim Summary:  Patient is a 56 year old female with past medical history of cognitive delay with aphasia who presented to the emergency department with shortness of breath and weakness.  Patient is from group home.  In the ER patient was found to have dehydration with acute kidney injury and was admitted for further management. Patient's hospital course was remarkable for development of pneumonia likely from aspiration.  Patient was evaluated by speech therapy and recommended dysphagia 1 diet with thin liquid. Patient has been started on Levaquin.  She has remained afebrile.  Her white cell counts are stable. Patient will be discharged to group home today.  Following problems were addressed during her hospitalization:  Acute kidney injury:Resolved.  Presented with acute kidney injury.   Hypernatremia: Resolved.   Dysphagia:  There was  concern for swallowing difficulty so  speech was consulted. Recommended thin liquids with dysphagia 1 diet She was having  some chest congestion.   Pneumonia: Chest x-ray this morning showed possible left lower lobe infiltrate started on levofloxacin.  She is afebrile her white cell counts are stable.  She is saturating normally on room air.. We will advise to repeat chest x-ray in 4-6 weeks. Check CBC and BMP in a week.  Hypokalemia: Supplemented.    Check potassium level in a week.  Leukocytosis: Resolved.    Check CBC in a week.  Cognitive impairment/aphasia: On baseline. Patient is from group  home  Seizure disorder: On Keppra and Lamictal.  History of DVT: On Xarelto  GERD: Continue PPI     Discharge Diagnoses:  Principal Problem:   ARF (acute renal failure) (HCC) Active Problems:   Dehydration   Leucocytosis   Seizure disorder (HCC)   Hypothyroid   GERD (gastroesophageal reflux disease)   DVT (deep venous thrombosis) (HCC)    Discharge Instructions  Discharge Instructions    Diet - low sodium heart healthy   Complete by:  As directed    Dysphagia 1 with thin liquid.   Discharge instructions   Complete by:  As directed    1) Continue taking prescribed medication as instructed. 2) Do a CBC and BMP testing a week. 3)Follow up Speech therapy.  Speech therapy recommends dysphagia 1 with thin liquid diet.   Increase activity slowly   Complete by:  As directed      Allergies as of 02/07/2017   No Known Allergies     Medication List    STOP taking these medications   doxycycline 100 MG tablet Commonly known as:  VIBRA-TABS     TAKE these medications   acetic acid 2 % otic solution Commonly known as:  VOSOL Place 4 drops into both ears 2 (two) times daily.   coal tar 0.5 % shampoo Commonly known as:  NEUTROGENA T-GEL Apply 1 application topically at bedtime as needed (scalp, dandruff).   docusate sodium 100 MG capsule Commonly known as:  COLACE Take 100 mg by mouth daily.   erythromycin ophthalmic ointment Place a 1/2 inch ribbon of ointment into the lower eyelid QID for one week.   fludrocortisone 0.1 MG tablet Commonly  known as:  FLORINEF Take 0.1 mg by mouth daily.   lamoTRIgine 100 MG tablet Commonly known as:  LAMICTAL Take 100 mg by mouth daily.   levETIRAcetam 1000 MG tablet Commonly known as:  KEPPRA Take 1 tablet (1,000 mg total) by mouth 2 (two) times daily.   levofloxacin 750 MG tablet Commonly known as:  LEVAQUIN Take 1 tablet (750 mg total) by mouth daily. Start taking on:  02/08/2017   levothyroxine 100 MCG  tablet Commonly known as:  SYNTHROID, LEVOTHROID Take 100 mcg by mouth daily.   Melatonin 3 MG Tabs Take 3 mg by mouth daily.   memantine 5 MG tablet Commonly known as:  NAMENDA Take 5 mg by mouth 2 (two) times daily.   metroNIDAZOLE 0.75 % cream Commonly known as:  METROCREAM Apply 1 application topically 2 (two) times daily.   montelukast 10 MG tablet Commonly known as:  SINGULAIR Take 10 mg by mouth daily.   omeprazole 20 MG capsule Commonly known as:  PRILOSEC Take 20 mg by mouth daily.   OSCAL 500/200 D-3 PO Take 1 tablet by mouth 2 (two) times daily.   potassium chloride SA 20 MEQ tablet Commonly known as:  K-DUR,KLOR-CON Take 1 tablet (20 mEq total) by mouth daily. Start taking on:  02/08/2017   RISAMINE 0.44-20.625 % Oint Generic drug:  Menthol-Zinc Oxide Apply 1 application topically 2 (two) times daily.   rivaroxaban 20 MG Tabs tablet Commonly known as:  XARELTO Take 20 mg by mouth daily with supper.   sertraline 25 MG tablet Commonly known as:  ZOLOFT Take 25 mg by mouth daily.   Vitamin D3 2000 units Tabs Take 2,000 Units by mouth daily after breakfast.      Follow-up Information    Royals, Gretta Began. Schedule an appointment as soon as possible for a visit in 1 week(s).   Specialty:  Family Medicine Contact information: [address] [place] [address] [phone]          No Known Allergies  Consultations:  None   Procedures/Studies: Dg Chest 1 View  Result Date: 02/07/2017 CLINICAL DATA:  Chest congestion EXAM: CHEST  1 VIEW COMPARISON:  02/02/2017 FINDINGS: Patchy consolidation has developed at the left base. Normal vascularity. Normal heart size. No pneumothorax. No pleural effusion. IMPRESSION: Increasing patchy consolidation at the left lung base. Followup PA and lateral chest X-ray is recommended in 3-4 weeks following trial of antibiotic therapy to ensure resolution and exclude underlying malignancy. Electronically  Signed   By: Jolaine Click M.D.   On: 02/07/2017 07:23   Dg Chest 2 View  Result Date: 02/02/2017 CLINICAL DATA:  56 year old female with shortness of breath. EXAM: CHEST  2 VIEW COMPARISON:  None. FINDINGS: Shallow inspiration with minimal bibasilar atelectasis. There is no focal consolidation, pleural effusion, pneumothorax. The cardiac silhouette is within normal limits. Mild prominence of the central vasculature may represent mild vascular congestion. No acute osseous pathology. IMPRESSION: Probable mild vascular congestion.  No focal consolidation. Electronically Signed   By: Elgie Collard M.D.   On: 02/02/2017 21:31   Ct Head Wo Contrast  Result Date: 01/27/2017 CLINICAL DATA:  C-spine trauma.  Seizure. EXAM: CT HEAD WITHOUT CONTRAST CT CERVICAL SPINE WITHOUT CONTRAST TECHNIQUE: Multidetector CT imaging of the head and cervical spine was performed following the standard protocol without intravenous contrast. Multiplanar CT image reconstructions of the cervical spine were also generated. COMPARISON:  None. FINDINGS: CT HEAD FINDINGS Brain: There is a punctate hyperdense focus in the left  basal ganglia. There is a similar less prominent focus in the right basal ganglia these are felt to represent basal ganglia calcifications. Allowing for this, there is no definitive evidence of acute intracranial hemorrhage. Global atrophy. There is severe dilatation of the lateral ventricles. Moderate dilatation of the third ventricle. Fourth ventricle is mildly dilated. There is no mass effect or midline shift. Vascular: No hyperdense vessel or unexpected calcification. Skull: Cranium is intact. Sinuses/Orbits: No evidence of vitreous or orbital hemorrhage. Paranasal sinuses are grossly clear. Other: Noncontributory CT CERVICAL SPINE FINDINGS There is straightening of the normally lordotic cervical spine. There is some malalignment of the left atlantoaxial joint. No definite acute fracture or dislocation. The pre  dens space is at the upper limit of normal in the gap distance. Chronic bony densities adjacent to the odontoid is present. Advanced spondylitic changes with calcification of the posterior longitudinal ligament. This results in spinal stenosis at C4-5. There is less prominent spinal stenosis at other levels. There is no obvious spinal hematoma or soft tissue hematoma within the neck. Prominent central disc herniation is suspected at C3-4 with spinal stenosis. Lung apices are grossly clear. Thyroid is unremarkable. There is advanced disc space narrowing at C3-4, C4-5, and C5-6. Moderate narrowing occurs at the C2-3 and C6-7 disc spaces. IMPRESSION: Head: No acute intracranial pathology. 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 Cervical spine: No acute bony injury. 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 Prominent central disc herniation is suspected at C3-4 causing spinal stenosis. Electronically Signed   By: Jolaine Click M.D.   On: 01/27/2017 11:33   Ct Cervical Spine Wo Contrast  Result Date: 01/27/2017 CLINICAL DATA:  C-spine trauma.  Seizure. EXAM: CT HEAD WITHOUT CONTRAST CT CERVICAL SPINE WITHOUT CONTRAST TECHNIQUE: Multidetector CT imaging of the head and cervical spine was performed following the standard protocol without intravenous contrast. Multiplanar CT image reconstructions of the cervical spine were also generated. COMPARISON:  None. FINDINGS: CT HEAD FINDINGS Brain: There is a punctate hyperdense focus in the left basal ganglia. There is a similar less prominent focus in the right basal ganglia these are felt to represent basal ganglia calcifications. Allowing for this, there is no definitive evidence of acute intracranial hemorrhage. Global atrophy. There is severe dilatation of the lateral ventricles. Moderate dilatation of the third ventricle. Fourth ventricle is mildly dilated. There is no mass effect or midline shift.  Vascular: No hyperdense vessel or unexpected calcification. Skull: Cranium is intact. Sinuses/Orbits: No evidence of vitreous or orbital hemorrhage. Paranasal sinuses are grossly clear. Other: Noncontributory CT CERVICAL SPINE FINDINGS There is straightening of the normally lordotic cervical spine. There is some malalignment of the left atlantoaxial joint. No definite acute fracture or dislocation. The pre dens space is at the upper limit of normal in the gap distance. Chronic bony densities adjacent to the odontoid is present. Advanced spondylitic changes with calcification of the posterior longitudinal ligament. This results in spinal stenosis at C4-5. There is less prominent spinal stenosis at other levels. There is no obvious spinal hematoma or soft tissue hematoma within the neck. Prominent central disc herniation is suspected at C3-4 with spinal stenosis. Lung apices are grossly clear. Thyroid is unremarkable. There is advanced disc space narrowing at C3-4, C4-5, and C5-6. Moderate narrowing occurs at the C2-3 and C6-7 disc spaces. IMPRESSION: Head: No acute intracranial pathology. 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 Cervical spine: No acute bony injury. Calcification of the posterior longitudinal ligament  results in spinal stenosis at C4-5. Prominent central disc herniation is suspected at C3-4 causing spinal stenosis. Electronically Signed   By: Jolaine ClickArthur  Hoss M.D.   On: 01/27/2017 11:33       Subjective: Patient seen and examined at the bedside this morning.  She has remained afebrile.  Her lungs are clear.  She has mild cough.  Discharge Exam: Vitals:   02/07/17 0259 02/07/17 0529  BP:  123/79  Pulse:  77  Resp:  18  Temp: 99.4 F (37.4 C) 99 F (37.2 C)  SpO2:  93%   Vitals:   02/06/17 1446 02/06/17 2029 02/07/17 0259 02/07/17 0529  BP: 104/62 117/74  123/79  Pulse: 74 77  77  Resp: 18 18  18   Temp: 99.3 F (37.4 C) 100.1 F (37.8 C) 99.4 F (37.4  C) 99 F (37.2 C)  TempSrc: Oral Oral Axillary Axillary  SpO2: 94% 98%  93%    General: Pt is alert, awake, not in acute distress Cardiovascular: RRR, S1/S2 +, no rubs, no gallops Respiratory: CTA bilaterally, no wheezing, no rhonchi Abdominal: Soft, NT, ND, bowel sounds + Extremities: no edema, no cyanosis    The results of significant diagnostics from this hospitalization (including imaging, microbiology, ancillary and laboratory) are listed below for reference.     Microbiology: No results found for this or any previous visit (from the past 240 hour(s)).   Labs: BNP (last 3 results) Recent Labs    02/02/17 2131  BNP 89.7   Basic Metabolic Panel: Recent Labs  Lab 02/02/17 2131 02/03/17 0633 02/04/17 0539 02/05/17 0539 02/06/17 0448  NA 149* 150* 146* 141 140  K 3.6 2.9* 3.6 3.3* 3.4*  CL 118* 120* 117* 111 112*  CO2 23 23 23 24 22   GLUCOSE 141* 98 97 97 93  BUN 26* 27* 19 17 17   CREATININE 1.59* 1.27* 0.81 0.76 0.74  CALCIUM 8.9 8.7* 8.3* 8.1* 8.1*  MG  --   --  2.1  --   --    Liver Function Tests: Recent Labs  Lab 02/02/17 2131 02/03/17 0633  AST 27 39  ALT 20 27  ALKPHOS 106 93  BILITOT 0.5 0.6  PROT 7.4 6.5  ALBUMIN 2.7* 2.4*   No results for input(s): LIPASE, AMYLASE in the last 168 hours. No results for input(s): AMMONIA in the last 168 hours. CBC: Recent Labs  Lab 02/02/17 2131 02/03/17 0633 02/04/17 0539  WBC 15.8* 12.0* 7.5  NEUTROABS 13.4*  --  4.7  HGB 11.9* 10.4* 10.3*  HCT 36.7 31.9* 31.6*  MCV 104.3* 103.2* 103.3*  PLT 279 255 256   Cardiac Enzymes: No results for input(s): CKTOTAL, CKMB, CKMBINDEX, TROPONINI in the last 168 hours. BNP: Invalid input(s): POCBNP CBG: No results for input(s): GLUCAP in the last 168 hours. D-Dimer No results for input(s): DDIMER in the last 72 hours. Hgb A1c No results for input(s): HGBA1C in the last 72 hours. Lipid Profile No results for input(s): CHOL, HDL, LDLCALC, TRIG, CHOLHDL,  LDLDIRECT in the last 72 hours. Thyroid function studies No results for input(s): TSH, T4TOTAL, T3FREE, THYROIDAB in the last 72 hours.  Invalid input(s): FREET3 Anemia work up No results for input(s): VITAMINB12, FOLATE, FERRITIN, TIBC, IRON, RETICCTPCT in the last 72 hours. Urinalysis    Component Value Date/Time   COLORURINE YELLOW (A) 01/27/2017 1452   APPEARANCEUR CLOUDY (A) 01/27/2017 1452   LABSPEC 1.015 01/27/2017 1452   PHURINE 7.0 01/27/2017 1452   GLUCOSEU NEGATIVE 01/27/2017 1452  HGBUR SMALL (A) 01/27/2017 1452   BILIRUBINUR NEGATIVE 01/27/2017 1452   KETONESUR NEGATIVE 01/27/2017 1452   PROTEINUR NEGATIVE 01/27/2017 1452   NITRITE NEGATIVE 01/27/2017 1452   LEUKOCYTESUR NEGATIVE 01/27/2017 1452   Sepsis Labs Invalid input(s): PROCALCITONIN,  WBC,  LACTICIDVEN Microbiology No results found for this or any previous visit (from the past 240 hour(s)).   Time coordinating discharge: Over 30 minutes  SIGNED:   Meredith Leeds, MD  [facility] 02/07/2017, 1:25 PM Pager [phone]  If 7PM-7AM, please contact night-coverage [email or web address] Password [email or web address]

## 2017-02-07 NOTE — Clinical Social Work Note (Signed)
Clinical Social Work Assessment  Patient Details  Name: Brooke Palmer MRN: [id number] Date of Birth: 05/25/1960  Date of referral:  02/07/17               Reason for consult:  Facility Placement, Discharge Planning                Permission sought to share information with:  Case Manager, Facility Medical sales representativeContact Representative, Family Supports Permission granted to share information::  No(Patient has difficulty communicating)  Name::        Agency::  [facility]  Relationship::     Contact Information:     Housing/Transportation Living arrangements for the past 2 months:  Group Home Source of Information:  Facility Patient Interpreter Needed:  None Criminal Activity/Legal Involvement Pertinent to Current Situation/Hospitalization:    Significant Relationships:  Merchandiser, retailCommunity Support Lives with:  Facility Resident Do you feel safe going back to the place where you live?  Yes Need for family participation in patient care:  Yes (Comment)  Care giving concerns:  No care giving concerns at the time of assessment.     Social Worker assessment / plan:  LCSW following for return to group home.  Patient from group home.  Patient admitted to hospital for pneumonia.   Patient has legal guardian. LCSW attempted reach legal guardian, Samuel GermanyMarilyn Drennen, for collateral information. LCW left message.   Patient was not able to participate in assessment.   LCSW spoke with nurse, Judeen Hammanshonda English from group home. She is agreeable to patients return to group home.   PLAN: Patient will return to group home.    Employment status:  Disabled (Comment on whether or not currently receiving Disability) Insurance information:  Medicare PT Recommendations:  Not assessed at this time Information / Referral to community resources:     Patient/Family's Response to care:  Not assessed.   Patient/Family's Understanding of and Emotional Response to Diagnosis, Current Treatment, and Prognosis:  Not assessed, LCSW  unable to contact guardian.   Emotional Assessment Appearance:  Appears stated age Attitude/Demeanor/Rapport:    Affect (typically observed):  Calm Orientation:  Oriented to Self Alcohol / Substance use:  Not Applicable Psych involvement (Current and /or in the community):  No (Comment)  Discharge Needs  Concerns to be addressed:    Readmission within the last 30 days:  No Current discharge risk:  None Barriers to Discharge:  No Barriers Identified   Coralyn HellingBernette Hazle Ogburn, LCSW 02/07/2017, 12:49 PM

## 2017-02-07 NOTE — Progress Notes (Signed)
Pts Iv removed with a clean and dry dressing intact. Pt has no s/s of pain or distress at the time of d/c. Pt taken to front entrance via wheel chair with group home care taker.

## 2017-02-11 ENCOUNTER — Encounter: Payer: Medicare Other | Attending: Physician Assistant | Admitting: Physician Assistant

## 2017-02-11 DIAGNOSIS — R41841 Cognitive communication deficit: Secondary | ICD-10-CM | POA: Insufficient documentation

## 2017-02-11 DIAGNOSIS — G40909 Epilepsy, unspecified, not intractable, without status epilepticus: Secondary | ICD-10-CM | POA: Diagnosis not present

## 2017-02-11 DIAGNOSIS — F0390 Unspecified dementia without behavioral disturbance: Secondary | ICD-10-CM | POA: Diagnosis not present

## 2017-02-11 DIAGNOSIS — E039 Hypothyroidism, unspecified: Secondary | ICD-10-CM | POA: Diagnosis not present

## 2017-02-11 DIAGNOSIS — I82409 Acute embolism and thrombosis of unspecified deep veins of unspecified lower extremity: Secondary | ICD-10-CM | POA: Insufficient documentation

## 2017-02-11 DIAGNOSIS — R1313 Dysphagia, pharyngeal phase: Secondary | ICD-10-CM | POA: Diagnosis not present

## 2017-02-11 DIAGNOSIS — N186 End stage renal disease: Secondary | ICD-10-CM | POA: Insufficient documentation

## 2017-02-11 DIAGNOSIS — L988 Other specified disorders of the skin and subcutaneous tissue: Secondary | ICD-10-CM | POA: Diagnosis not present

## 2017-02-11 NOTE — Progress Notes (Signed)
Brooke Palmer, Brooke Palmer ([id number]) Visit Report for 02/11/2017 Abuse/Suicide Risk Screen Details Patient Name: Brooke Palmer, Brooke Palmer Date of Service: 02/11/2017 8:15 AM Medical Record Number: [id number] Patient Account Number: [id number] Date of Birth/Sex: 07/29/1960 (56 y.o. Female) Treating RN: Renne CriglerFlinchum, Cheryl Primary Care Jourdyn Ferrin: Katina DungOYALS, HOOVER Other Clinician: Referring Ersilia Brawley: Katina DungOYALS, HOOVER Treating Elleanna Melling/Extender: STONE III, HOYT Weeks in Treatment: 0 Abuse/Suicide Risk Screen Items Answer ABUSE/SUICIDE RISK SCREEN: Has anyone close to you tried to hurt or harm you recentlyo No Do you feel uncomfortable with anyone in your familyo No Has anyone forced you do things that you didnot want to doo No Do you have any thoughts of harming yourselfo No Patient displays signs or symptoms of abuse and/or neglect. No Electronic Signature(s) Signed: 02/11/2017 10:55:56 AM By: Renne CriglerFlinchum, Cheryl Entered By: Renne CriglerFlinchum, Cheryl on 02/11/2017 10:55:56 Brooke Palmer, Brooke Palmer ([id number]) -------------------------------------------------------------------------------- Activities of Daily Living Details Patient Name: Brooke Palmer, Brooke Palmer Date of Service: 02/11/2017 8:15 AM Medical Record Number: [id number] Patient Account Number: [id number] Date of Birth/Sex: 04/08/1960 (56 y.o. Female) Treating RN: Renne CriglerFlinchum, Cheryl Primary Care Rateel Beldin: Katina DungOYALS, HOOVER Other Clinician: Referring Kahdijah Errickson: Katina DungOYALS, HOOVER Treating Chiquetta Langner/Extender: STONE III, HOYT Weeks in Treatment: 0 Activities of Daily Living Items Answer Activities of Daily Living (Please select one for each item) Drive Automobile Not Able Take Medications Not Able Use Telephone Not Able Care for Appearance Not Able Use Toilet Not Able Bath / Shower Not Able Dress Self Not Able Feed Self Not Able Walk Not Able Get In / Out Bed Not Able Housework Not Able Prepare Meals Not Able Handle Money Not Able Shop for Self Not Able Electronic  Signature(s) Signed: 02/11/2017 10:56:00 AM By: Renne CriglerFlinchum, Cheryl Previous Signature: 02/11/2017 9:19:36 AM Version By: Renne CriglerFlinchum, Cheryl Entered By: Renne CriglerFlinchum, Cheryl on 02/11/2017 10:56:00 Brooke Palmer, Brooke Palmer ([id number]) -------------------------------------------------------------------------------- Education Assessment Details Patient Name: Brooke Palmer, Brooke Palmer Date of Service: 02/11/2017 8:15 AM Medical Record Number: [id number] Patient Account Number: [id number] Date of Birth/Sex: 12/06/1960 (56 y.o. Female) Treating RN: Renne CriglerFlinchum, Cheryl Primary Care Davidmichael Zarazua: Katina DungOYALS, HOOVER Other Clinician: Referring Lilah Mijangos: Katina DungOYALS, HOOVER Treating Sidharth Leverette/Extender: Linwood DibblesSTONE III, HOYT Weeks in Treatment: 0 Learning Preferences/Education Level/Primary Language Learning Preference: Explanation Preferred Language: English Cognitive Barrier Assessment/Beliefs Language Barrier: No Translator Needed: No Memory Deficit: No Emotional Barrier: No Cultural/Religious Beliefs Affecting Medical Care: No Physical Barrier Assessment Impaired Vision: Yes Impaired Hearing: No Decreased Hand dexterity: No Knowledge/Comprehension Assessment Knowledge Level: High Comprehension Level: High Ability to understand written High instructions: Ability to understand verbal High instructions: Motivation Assessment Anxiety Level: Calm Cooperation: Cooperative Education Importance: Acknowledges Need Interest in Health Problems: Asks Questions Perception: Coherent Willingness to Engage in Self- High Management Activities: Readiness to Engage in Self- High Management Activities: Electronic Signature(s) Signed: 02/11/2017 10:56:04 AM By: Renne CriglerFlinchum, Cheryl Entered By: Renne CriglerFlinchum, Cheryl on 02/11/2017 10:56:04 Brooke Palmer, Brooke Palmer ([id number]) -------------------------------------------------------------------------------- Fall Risk Assessment Details Patient Name: Brooke Palmer, Brooke Palmer Date of Service: 02/11/2017 8:15 AM Medical  Record Number: [id number] Patient Account Number: [id number] Date of Birth/Sex: 09/10/1960 (56 y.o. Female) Treating RN: Renne CriglerFlinchum, Cheryl Primary Care Renae Mottley: Katina DungOYALS, HOOVER Other Clinician: Referring Joeann Steppe: Katina DungOYALS, HOOVER Treating Julyana Woolverton/Extender: STONE III, HOYT Weeks in Treatment: 0 Fall Risk Assessment Items Have you had 2 or more falls in the last 12 monthso 0 No Have you had any fall that resulted in injury in the last 12 monthso 0 No FALL RISK ASSESSMENT: History of falling - immediate or within 3 months 0 No Secondary diagnosis 0 No Ambulatory aid None/bed rest/wheelchair/nurse 0 Yes Crutches/cane/walker 0 No Furniture 0 No IV Access/Saline Lock 0 No Gait/Training Normal/bed rest/immobile  0 No Weak 0 No Impaired 0 No Mental Status Oriented to own ability 0 No Electronic Signature(s) Signed: 02/11/2017 10:56:08 AM By: Renne CriglerFlinchum, Cheryl Entered By: Renne CriglerFlinchum, Cheryl on 02/11/2017 10:56:08 Brooke Palmer, Brooke Palmer ([id number]) -------------------------------------------------------------------------------- Foot Assessment Details Patient Name: Brooke Palmer, Brooke Palmer Date of Service: 02/11/2017 8:15 AM Medical Record Number: [id number] Patient Account Number: [id number] Date of Birth/Sex: 02/06/1961 (56 y.o. Female) Treating RN: Renne CriglerFlinchum, Cheryl Primary Care Nupur Hohman: Katina DungOYALS, HOOVER Other Clinician: Referring Kallie Depolo: Katina DungOYALS, HOOVER Treating Dora Simeone/Extender: STONE III, HOYT Weeks in Treatment: 0 Foot Assessment Items [x]  Unable to perform due to altered mental status Site Locations + = Sensation present, - = Sensation absent, C = Callus, U = Ulcer R = Redness, W = Warmth, M = Maceration, PU = Pre-ulcerative lesion F = Fissure, S = Swelling, D = Dryness Assessment Right: Left: Other Deformity: No No Prior Foot Ulcer: No No Prior Amputation: No No Charcot Joint: No No Ambulatory Status: Non-ambulatory Assistance Device: Wheelchair Gait: Electronic Signature(s) Signed:  02/11/2017 10:57:18 AM By: Renne CriglerFlinchum, Cheryl Entered By: Renne CriglerFlinchum, Cheryl on 02/11/2017 10:57:18 Brooke Palmer, Arlanda ([id number]) -------------------------------------------------------------------------------- Nutrition Risk Assessment Details Patient Name: Brooke Palmer, Maleia Date of Service: 02/11/2017 8:15 AM Medical Record Number: [id number] Patient Account Number: [id number] Date of Birth/Sex: 01/02/1961 (56 y.o. Female) Treating RN: Renne CriglerFlinchum, Cheryl Primary Care Emmalou Hunger: Katina DungOYALS, HOOVER Other Clinician: Referring Khalie Wince: Katina DungOYALS, HOOVER Treating Machell Wirthlin/Extender: STONE III, HOYT Weeks in Treatment: 0 Height (in): Weight (lbs): Body Mass Index (BMI): Nutrition Risk Assessment Items NUTRITION RISK SCREEN: I have an illness or condition that made me change the kind and/or amount of 2 Yes food I eat I eat fewer than two meals per day 0 No I eat few fruits and vegetables, or milk products 2 Yes I have three or more drinks of beer, liquor or wine almost every day 0 No I have tooth or mouth problems that make it hard for me to eat 0 No I don't always have enough money to buy the food I need 0 No I eat alone most of the time 0 No I take three or more different prescribed or over-the-counter drugs a day 0 No Without wanting to, I have lost or gained 10 pounds in the last six months 0 No I am not always physically able to shop, cook and/or feed myself 2 Yes Nutrition Protocols Good Risk Protocol Moderate Risk Protocol Provide education on High Risk Proctocol 0 Electronic Signature(s) Signed: 02/11/2017 10:56:58 AM By: Renne CriglerFlinchum, Cheryl Entered By: Renne CriglerFlinchum, Cheryl on 02/11/2017 10:56:58

## 2017-02-13 NOTE — Progress Notes (Signed)
GALENA, LOGIE ([id number]) Visit Report for 02/11/2017 Allergy List Details Patient Name: Brooke Palmer, Brooke Palmer Date of Service: 02/11/2017 8:15 AM Medical Record Number: [id number] Patient Account Number: [id number] Date of Birth/Sex: 23-Jan-1961 (57 y.o. Female) Treating RN: Renne Crigler Primary Care Courtne Lighty: Katina Dung Other Clinician: Referring Adrianne Shackleton: Katina Dung Treating Laray Corbit/Extender: STONE III, HOYT Weeks in Treatment: 0 Allergies Active Allergies No Known Allergies Allergy Notes Electronic Signature(s) Signed: 02/11/2017 4:30:41 PM By: Renne Crigler Entered By: Renne Crigler on 02/11/2017 09:27:52 Brooke Palmer ([id number]) -------------------------------------------------------------------------------- Arrival Information Details Patient Name: Brooke Palmer Date of Service: 02/11/2017 8:15 AM Medical Record Number: [id number] Patient Account Number: [id number] Date of Birth/Sex: [birth year] (57 y.o. Female) Treating RN: Renne Crigler Primary Care Kendelle Schweers: Katina Dung Other Clinician: Referring Rip Hawes: Katina Dung Treating Johnhenry Tippin/Extender: Linwood Dibbles, HOYT Weeks in Treatment: 0 Visit Information Patient Arrived: Wheel Chair Arrival Time: 08:48 Accompanied By: Caregiver Transfer Assistance: None Patient Identification Verified: Yes Secondary Verification Process Completed: Yes Electronic Signature(s) Signed: 02/11/2017 4:30:41 PM By: Renne Crigler Entered By: Renne Crigler on 02/11/2017 08:49:15 Brooke Palmer ([id number]) -------------------------------------------------------------------------------- Clinic Level of Care Assessment Details Patient Name: Brooke Palmer Date of Service: 02/11/2017 8:15 AM Medical Record Number: [id number] Patient Account Number: [id number] Date of Birth/Sex: 02-09-1961 (56 y.o. Female) Treating RN: Renne Crigler Primary Care Delmas Faucett: Katina Dung Other Clinician: Referring Matie Dimaano:  Katina Dung Treating Cirilo Canner/Extender: STONE III, HOYT Weeks in Treatment: 0 Clinic Level of Care Assessment Items TOOL 2 Quantity Score X - Use when only an EandM is performed on the INITIAL visit 1 0 ASSESSMENTS - Nursing Assessment / Reassessment X - General Physical Exam (combine w/ comprehensive assessment (listed just below) when 1 20 performed on new pt. evals) X- 1 25 Comprehensive Assessment (HX, ROS, Risk Assessments, Wounds Hx, etc.) ASSESSMENTS - Wound and Skin Assessment / Reassessment X - Simple Wound Assessment / Reassessment - one wound 1 5 []  - 0 Complex Wound Assessment / Reassessment - multiple wounds []  - 0 Dermatologic / Skin Assessment (not related to wound area) ASSESSMENTS - Ostomy and/or Continence Assessment and Care []  - Incontinence Assessment and Management 0 []  - 0 Ostomy Care Assessment and Management (repouching, etc.) PROCESS - Coordination of Care X - Simple Patient / Family Education for ongoing care 1 15 []  - 0 Complex (extensive) Patient / Family Education for ongoing care []  - 0 Staff obtains Chiropractor, Records, Test Results / Process Orders []  - 0 Staff telephones HHA, Nursing Homes / Clarify orders / etc []  - 0 Routine Transfer to another Facility (non-emergent condition) []  - 0 Routine Hospital Admission (non-emergent condition) []  - 0 New Admissions / Manufacturing engineer / Ordering NPWT, Apligraf, etc. []  - 0 Emergency Hospital Admission (emergent condition) X- 1 10 Simple Discharge Coordination []  - 0 Complex (extensive) Discharge Coordination PROCESS - Special Needs []  - Pediatric / Minor Patient Management 0 []  - 0 Isolation Patient Management AVAREY, YAEGER ([id number]) []  - 0 Hearing / Language / Visual special needs []  - 0 Assessment of Community assistance (transportation, D/C planning, etc.) []  - 0 Additional assistance / Altered mentation []  - 0 Support Surface(s) Assessment (bed, cushion, seat,  etc.) INTERVENTIONS - Wound Cleansing / Measurement X - Wound Imaging (photographs - any number of wounds) 1 5 []  - 0 Wound Tracing (instead of photographs) X- 1 5 Simple Wound Measurement - one wound []  - 0 Complex Wound Measurement - multiple wounds X- 1 5 Simple Wound Cleansing - one wound []  - 0 Complex Wound Cleansing - multiple wounds INTERVENTIONS -  Wound Dressings X - Small Wound Dressing one or multiple wounds 1 10 []  - 0 Medium Wound Dressing one or multiple wounds []  - 0 Large Wound Dressing one or multiple wounds []  - 0 Application of Medications - injection INTERVENTIONS - Miscellaneous []  - External ear exam 0 []  - 0 Specimen Collection (cultures, biopsies, blood, body fluids, etc.) []  - 0 Specimen(s) / Culture(s) sent or taken to Lab for analysis []  - 0 Patient Transfer (multiple staff / Michiel SitesHoyer Lift / Similar devices) []  - 0 Simple Staple / Suture removal (25 or less) []  - 0 Complex Staple / Suture removal (26 or more) []  - 0 Hypo / Hyperglycemic Management (close monitor of Blood Glucose) X- 1 15 Ankle / Brachial Index (ABI) - do not check if billed separately Has the patient been seen at the hospital within the last three years: Yes Total Score: 115 Level Of Care: New/Established - Level 3 Electronic Signature(s) Signed: 02/11/2017 4:30:41 PM By: Renne CriglerFlinchum, Cheryl Entered By: Renne CriglerFlinchum, Cheryl on 02/11/2017 11:00:24 Brooke Palmer, Fronnie ([id number]) -------------------------------------------------------------------------------- Encounter Discharge Information Details Patient Name: Brooke Palmer, Brooke Palmer Date of Service: 02/11/2017 8:15 AM Medical Record Number: [id number] Patient Account Number: [id number] Date of Birth/Sex: 05/31/1960 (57 y.o. Female) Treating RN: Renne CriglerFlinchum, Cheryl Primary Care Melissaann Dizdarevic: Katina DungOYALS, HOOVER Other Clinician: Referring Cowan Pilar: Katina DungOYALS, HOOVER Treating Clotilda Hafer/Extender: Linwood DibblesSTONE III, HOYT Weeks in Treatment: 0 Encounter Discharge  Information Items Discharge Pain Level: 0 Discharge Condition: Stable Ambulatory Status: Wheelchair Discharge Destination: Home Transportation: Other Accompanied By: caregiver Schedule Follow-up Appointment: Yes Medication Reconciliation completed and No provided to Patient/Care Carrieanne Kleen: Provided on Clinical Summary of Care: 02/11/2017 Form Type Recipient Paper Patient CM Electronic Signature(s) Signed: 02/11/2017 11:02:31 AM By: Renne CriglerFlinchum, Cheryl Entered By: Renne CriglerFlinchum, Cheryl on 02/11/2017 11:02:31 Brooke Palmer, Carylon ([id number]) -------------------------------------------------------------------------------- Lower Extremity Assessment Details Patient Name: Brooke Palmer, Aidan Date of Service: 02/11/2017 8:15 AM Medical Record Number: [id number] Patient Account Number: [id number] Date of Birth/Sex: 01/07/1961 (57 y.o. Female) Treating RN: Renne CriglerFlinchum, Cheryl Primary Care Chandria Rookstool: Katina DungOYALS, HOOVER Other Clinician: Referring Heraclio Seidman: Katina DungOYALS, HOOVER Treating Imaan Padgett/Extender: STONE III, HOYT Weeks in Treatment: 0 Edema Assessment Assessed: [Left: No] [Right: No] Edema: [Left: N] [Right: o] Vascular Assessment Claudication: Claudication Assessment [Left:None] Pulses: Dorsalis Pedis Palpable: [Left:Yes] Posterior Tibial Extremity colors, hair growth, and conditions: Extremity Color: [Left:Mottled] Hair Growth on Extremity: [Left:No] Temperature of Extremity: [Left:Cold] Capillary Refill: [Left:> 3 seconds] Blood Pressure: Brachial: [Left:82] Dorsalis Pedis: 82 [Left:Dorsalis Pedis:] Ankle: Posterior Tibial: 76 [Left:Posterior Tibial: 1.00] Toe Nail Assessment Left: Right: Thick: Yes Discolored: Yes Deformed: Yes Improper Length and Hygiene: Yes Electronic Signature(s) Signed: 02/11/2017 4:30:41 PM By: Renne CriglerFlinchum, Cheryl Entered By: Renne CriglerFlinchum, Cheryl on 02/11/2017 09:27:23 Brooke Palmer, Livy  ([id number]) -------------------------------------------------------------------------------- Multi Wound Chart Details Patient Name: Brooke Palmer, Aniaya Date of Service: 02/11/2017 8:15 AM Medical Record Number: [id number] Patient Account Number: [id number] Date of Birth/Sex: 07/05/1960 (57 y.o. Female) Treating RN: Renne CriglerFlinchum, Cheryl Primary Care Huel Centola: Katina DungOYALS, HOOVER Other Clinician: Referring Faithann Natal: Katina DungOYALS, HOOVER Treating Khari Mally/Extender: STONE III, HOYT Weeks in Treatment: 0 Vital Signs Height(in): Pulse(bpm): 70 Weight(lbs): Blood Pressure(mmHg): 98/48 Body Mass Index(BMI): Temperature(F): 98.3 Respiratory Rate 16 (breaths/min): Photos: [1:No Photos] [2:No Photos] [N/A:N/A] Wound Location: [1:Toe Great] [2:Left Toe Second] [N/A:N/A] Wounding Event: [1:Gradually Appeared] [2:Pressure Injury] [N/A:N/A] Primary Etiology: [1:Pressure Ulcer] [2:Pressure Ulcer] [N/A:N/A] Date Acquired: [1:02/04/2017] [2:02/04/2017] [N/A:N/A] Weeks of Treatment: [1:0] [2:0] [N/A:N/A] Wound Status: [1:Healed - Epithelialized] [2:Open] [N/A:N/A] Measurements L x W x D [1:0x0x0] [2:0.5x0.4x0.1] [N/A:N/A] (cm) Area (cm) : [1:0] [2:0.157] [N/A:N/A] Volume (cm) : [1:0] [2:0.016] [N/A:N/A] Classification: [1:Unstageable/Unclassified] [2:Unstageable/Unclassified] [N/A:N/A] Exudate  Amount: [1:None Present] [2:None Present] [N/A:N/A] Wound Margin: [1:Flat and Intact] [2:Flat and Intact] [N/A:N/A] Granulation Amount: [1:None Present (0%)] [2:None Present (0%)] [N/A:N/A] Necrotic Amount: [1:Large (67-100%)] [2:Large (67-100%)] [N/A:N/A] Necrotic Tissue: [1:Eschar] [2:Eschar] [N/A:N/A] Exposed Structures: [1:Fascia: No Fat Layer (Subcutaneous Tissue) Exposed: No Tendon: No Muscle: No Joint: No Bone: No] [2:N/A] [N/A:N/A] Epithelialization: [1:Large (67-100%)] [2:Large (67-100%)] [N/A:N/A] Periwound Skin Texture: [1:Callus: Yes Excoriation: No Induration: No Crepitus: No Rash: No Scarring: No]  [2:Callus: Yes Excoriation: No Induration: No Crepitus: No Rash: No Scarring: No] [N/A:N/A] Periwound Skin Moisture: [1:Maceration: No Dry/Scaly: No] [2:Maceration: No Dry/Scaly: No] [N/A:N/A] Periwound Skin Color: [1:Atrophie Blanche: No Cyanosis: No Ecchymosis: No Erythema: No] [2:Atrophie Blanche: No Cyanosis: No Ecchymosis: No Erythema: No] [N/A:N/A] Hemosiderin Staining: No Hemosiderin Staining: No Mottled: No Mottled: No Pallor: No Pallor: No Rubor: No Rubor: No Tenderness on Palpation: No No N/A Wound Preparation: Ulcer Cleansing: Ulcer Cleansing: N/A Rinsed/Irrigated with Saline Rinsed/Irrigated with Saline Topical Anesthetic Applied: Topical Anesthetic Applied: Other: lidocaine 4% Other: lidocaine 4% Treatment Notes Electronic Signature(s) Signed: 02/11/2017 10:59:42 AM By: Renne Crigler Entered By: Renne Crigler on 02/11/2017 10:59:42 BUNNY, KLEIST ([id number]) -------------------------------------------------------------------------------- Multi-Disciplinary Care Plan Details Patient Name: Brooke Palmer Date of Service: 02/11/2017 8:15 AM Medical Record Number: [id number] Patient Account Number: [id number] Date of Birth/Sex: Nov 18, 1960 (57 y.o. Female) Treating RN: Renne Crigler Primary Care Mead Slane: Katina Dung Other Clinician: Referring Kentarius Partington: Katina Dung Treating Saint Hank/Extender: Linwood Dibbles, HOYT Weeks in Treatment: 0 Active Inactive ` Orientation to the Wound Care Program Nursing Diagnoses: Knowledge deficit related to the wound healing center program Goals: Patient/caregiver will verbalize understanding of the Wound Healing Center Program Date Initiated: 02/11/2017 Target Resolution Date: 03/14/2017 Goal Status: Active Interventions: Provide education on orientation to the wound center Notes: ` Wound/Skin Impairment Nursing Diagnoses: Impaired tissue integrity Knowledge deficit related to ulceration/compromised skin  integrity Goals: Patient/caregiver will verbalize understanding of skin care regimen Date Initiated: 02/11/2017 Target Resolution Date: 03/14/2017 Goal Status: Active Ulcer/skin breakdown will have a volume reduction of 30% by week 4 Date Initiated: 02/11/2017 Target Resolution Date: 03/14/2017 Goal Status: Active Interventions: Assess patient/caregiver ability to perform ulcer/skin care regimen upon admission and as needed Assess ulceration(s) every visit Treatment Activities: Skin care regimen initiated : 02/11/2017 Notes: Electronic Signature(s) Signed: 02/11/2017 10:59:31 AM By: Ammie Ferrier, Eber Jones ([id number]) Entered By: Renne Crigler on 02/11/2017 10:59:30 Brooke Palmer ([id number]) -------------------------------------------------------------------------------- Non-Wound Condition Assessment Details Patient Name: Brooke Palmer Date of Service: 02/11/2017 8:15 AM Medical Record Number: [id number] Patient Account Number: [id number] Date of Birth/Sex: 1961-01-29 (57 y.o. Female) Treating RN: Renne Crigler Primary Care Araiya Tilmon: Katina Dung Other Clinician: Referring Zaidy Absher: Katina Dung Treating Marris Frontera/Extender: STONE III, HOYT Weeks in Treatment: 0 Non-Wound Condition: Condition: Suspected Deep Tissue Injury Location: Other: heel Side: Left Photos Periwound Skin Texture Texture Color No Abnormalities Noted: No No Abnormalities Noted: No Callus: No Atrophie Blanche: No Crepitus: No Cyanosis: No Excoriation: No Ecchymosis: Yes Friable: No Erythema: No Induration: No Hemosiderin Staining: No Rash: No Mottled: No Scarring: No Pallor: No Rubor: No Moisture No Abnormalities Noted: No Temperature / Pain Dry / Scaly: Yes Temperature: No Abnormality Maceration: No Notes 1.5 x 1 x 0.1 Electronic Signature(s) Signed: 02/11/2017 12:42:49 PM By: Renne Crigler Entered By: Renne Crigler on 02/11/2017 12:42:49 Brooke Palmer  ([id number]) -------------------------------------------------------------------------------- Pain Assessment Details Patient Name: Brooke Palmer Date of Service: 02/11/2017 8:15 AM Medical Record Number: [id number] Patient Account Number: [id number] Date of Birth/Sex: 09-02-1960 (57 y.o. Female) Treating RN: Renne Crigler Primary Care Leta Bucklin: Katina Dung Other  Clinician: Referring Rylie Limburg: Katina Dung Treating Alayza Pieper/Extender: STONE III, HOYT Weeks in Treatment: 0 Active Problems Location of Pain Severity and Description of Pain Patient Has Paino Patient Unable to Respond Site Locations Rate the pain. Current Pain Level: 6 Pain Management and Medication Current Pain Management: Electronic Signature(s) Signed: 02/11/2017 4:30:41 PM By: Renne Crigler Entered By: Renne Crigler on 02/11/2017 08:49:42 Brooke Palmer ([id number]) -------------------------------------------------------------------------------- Patient/Caregiver Education Details Patient Name: Brooke Palmer Date of Service: 02/11/2017 8:15 AM Medical Record Number: [id number] Patient Account Number: [id number] Date of Birth/Gender: 13-Apr-1960 (56 y.o. Female) Treating RN: Renne Crigler Primary Care Physician: Katina Dung Other Clinician: Referring Physician: Katina Dung Treating Physician/Extender: Skeet Simmer in Treatment: 0 Education Assessment Education Provided To: Patient and Caregiver orders sent with caregiver for group home Education Topics Provided Welcome To The Wound Care Center: Handouts: Welcome To The Wound Care Center Methods: Explain/Verbal Responses: State content correctly Wound/Skin Impairment: Handouts: Caring for Your Ulcer Methods: Explain/Verbal Responses: State content correctly Electronic Signature(s) Signed: 02/11/2017 4:30:41 PM By: Renne Crigler Entered By: Renne Crigler on 02/11/2017 11:03:12 Brooke Palmer  ([id number]) -------------------------------------------------------------------------------- Wound Assessment Details Patient Name: Brooke Palmer Date of Service: 02/11/2017 8:15 AM Medical Record Number: [id number] Patient Account Number: [id number] Date of Birth/Sex: 1960-07-16 (57 y.o. Female) Treating RN: Renne Crigler Primary Care Santoria Chason: Katina Dung Other Clinician: Referring Devota Viruet: Katina Dung Treating Garnet Chatmon/Extender: STONE III, HOYT Weeks in Treatment: 0 Wound Status Wound Number: 1 Primary Etiology: Pressure Ulcer Wound Location: Toe Great Wound Status: Healed - Epithelialized Wounding Event: Gradually Appeared Date Acquired: 02/04/2017 Weeks Of Treatment: 0 Clustered Wound: No Photos Photo Uploaded By: Renne Crigler on 02/11/2017 12:42:15 Wound Measurements Length: (cm) 0 % Width: (cm) 0 % Depth: (cm) 0 Ep Area: (cm) 0 T Volume: (cm) 0 U Reduction in Area: Reduction in Volume: ithelialization: Large (67-100%) unneling: No ndermining: No Wound Description Classification: Unstageable/Unclassified Wound Margin: Flat and Intact Exudate Amount: None Present Foul Odor After Cleansing: No Slough/Fibrino No Wound Bed Granulation Amount: None Present (0%) Exposed Structure Necrotic Amount: Large (67-100%) Fascia Exposed: No Necrotic Quality: Eschar Fat Layer (Subcutaneous Tissue) Exposed: No Tendon Exposed: No Muscle Exposed: No Joint Exposed: No Bone Exposed: No Periwound Skin Texture Texture Color No Abnormalities Noted: No No Abnormalities Noted: No WARSHAWSKY, [place] ([id number]) Callus: Yes Atrophie Blanche: No Crepitus: No Cyanosis: No Excoriation: No Ecchymosis: No Induration: No Erythema: No Rash: No Hemosiderin Staining: No Scarring: No Mottled: No Pallor: No Moisture Rubor: No No Abnormalities Noted: No Dry / Scaly: No Maceration: No Wound Preparation Ulcer Cleansing: Rinsed/Irrigated with Saline Topical  Anesthetic Applied: Other: lidocaine 4%, Electronic Signature(s) Signed: 02/11/2017 4:30:41 PM By: Renne Crigler Entered By: Renne Crigler on 02/11/2017 09:27:11 ZYAH, GOMM ([id number]) -------------------------------------------------------------------------------- Wound Assessment Details Patient Name: Brooke Palmer Date of Service: 02/11/2017 8:15 AM Medical Record Number: [id number] Patient Account Number: [id number] Date of Birth/Sex: October 23, 1960 (57 y.o. Female) Treating RN: Renne Crigler Primary Care Phyllip Claw: Katina Dung Other Clinician: Referring Ebon Ketchum: Katina Dung Treating Yoko Mcgahee/Extender: STONE III, HOYT Weeks in Treatment: 0 Wound Status Wound Number: 2 Primary Etiology: Pressure Ulcer Wound Location: Left Toe Second Wound Status: Open Wounding Event: Pressure Injury Date Acquired: 02/04/2017 Weeks Of Treatment: 0 Clustered Wound: No Photos Photo Uploaded By: Renne Crigler on 02/11/2017 12:42:15 Wound Measurements Length: (cm) 0.5 Width: (cm) 0.4 Depth: (cm) 0.1 Area: (cm) 0.157 Volume: (cm) 0.016 % Reduction in Area: % Reduction in Volume: Epithelialization: Large (67-100%) Tunneling: No Undermining: No Wound Description Classification: Unstageable/Unclassified Wound Margin: Flat and Intact Exudate  Amount: None Present Foul Odor After Cleansing: No Slough/Fibrino No Wound Bed Granulation Amount: None Present (0%) Necrotic Amount: Large (67-100%) Necrotic Quality: Eschar Periwound Skin Texture Texture Color No Abnormalities Noted: No No Abnormalities Noted: No Callus: Yes Atrophie Blanche: No Crepitus: No Cyanosis: No Excoriation: No Ecchymosis: No Induration: No Erythema: No HATTABAUGH, Vanya ([id number]) Rash: No Hemosiderin Staining: No Scarring: No Mottled: No Pallor: No Moisture Rubor: No No Abnormalities Noted: No Dry / Scaly: No Maceration: No Wound Preparation Ulcer Cleansing: Rinsed/Irrigated with  Saline Topical Anesthetic Applied: Other: lidocaine 4%, Treatment Notes Wound #2 (Left Toe Second) 1. Cleansed with: Clean wound with Normal Saline 4. Dressing Applied: Other dressing (specify in notes) 5. Secondary Dressing Applied Dry Gauze Notes betadine to second toe wound Electronic Signature(s) Signed: 02/11/2017 4:30:41 PM By: Renne Crigler Entered By: Renne Crigler on 02/11/2017 09:06:25 Brooke Palmer ([id number]) -------------------------------------------------------------------------------- Vitals Details Patient Name: Brooke Palmer Date of Service: 02/11/2017 8:15 AM Medical Record Number: [id number] Patient Account Number: [id number] Date of Birth/Sex: March 08, 1960 (57 y.o. Female) Treating RN: Renne Crigler Primary Care Bellami Farrelly: Katina Dung Other Clinician: Referring Demetric Dunnaway: Katina Dung Treating Andrius Andrepont/Extender: STONE III, HOYT Weeks in Treatment: 0 Vital Signs Time Taken: 08:49 Temperature (F): 98.3 Pulse (bpm): 70 Respiratory Rate (breaths/min): 16 Blood Pressure (mmHg): 98/48 Reference Range: 80 - 120 mg / dl Pulse Oximetry (%): 0 Inhaled Oxygen Concentration (%): 99 Electronic Signature(s) Signed: 02/11/2017 4:30:41 PM By: Renne Crigler Entered By: Renne Crigler on 02/11/2017 09:17:16

## 2017-02-13 NOTE — Progress Notes (Signed)
LARUEN, RISSER ([id number]) Visit Report for 02/11/2017 Chief Complaint Document Details Patient Name: Brooke Palmer, Brooke Palmer Date of Service: 02/11/2017 8:15 AM Medical Record Number: [id number] Patient Account Number: [id number] Date of Birth/Sex: [birth year] (57 y.o. Female) Treating RN: Renne Crigler Primary Care Provider: Katina Dung Other Clinician: Referring Provider: Katina Dung Treating Provider/Extender: Linwood Dibbles, HOYT Weeks in Treatment: 0 Information Obtained from: Patient Chief Complaint Left second toe ulcer and right heel DTI Electronic Signature(s) Signed: 02/13/2017 11:24:19 AM By: Lenda Kelp PA-C Entered By: Lenda Kelp on 02/11/2017 14:50:01 Brooke Palmer ([id number]) -------------------------------------------------------------------------------- HPI Details Patient Name: Brooke Palmer Date of Service: 02/11/2017 8:15 AM Medical Record Number: [id number] Patient Account Number: [id number] Date of Birth/Sex: 21-Jul-1960 (57 y.o. 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 The good news is the ulcerations on the left lower extremity appear to be doing well with one of the areas over the great toe region actually healed. In regard to the left second toe it appears this to may be healed although I'm not 100% confident in calling this so just based on this  initial evaluation there does not appear to be any drainage however. Subsequently there is also a left heel deep tissue injury which appears to be minimal and does not appear to hurt her at this point which is good news. Unfortunately secondary to her mental status she is unable to rate or describe any discomfort she may be having. She is a resident at a group home. However with the severity of her impairment including the need for a Hoyer lift in order to move her around I am wondering if she should not actually be a skilled patient. No fevers, chills, nausea, or vomiting noted at this time. Patient did previously have pneumonia as well as acute kidney failure when she was most recently in the hospital prior to discharge and then subsequent follow-up with [place]. Electronic Signature(s) Signed: 02/13/2017 11:24:19 AM By: Lenda Kelp PA-C Entered By: Lenda Kelp on 02/11/2017 14:56:09 Brooke Palmer, Brooke Palmer ([id number]) -------------------------------------------------------------------------------- Physical Exam Details Patient Name: Brooke Palmer Date of Service: 02/11/2017 8:15 AM Medical Record Number: [id number] Patient Account Number: [id number] Date of Birth/Sex: 03-07-1960 (57 y.o. Female) Treating RN: Renne Crigler Primary Care Provider: Katina Dung Other Clinician: Referring Provider: Katina Dung Treating Provider/Extender: STONE III, HOYT Weeks in Treatment: 0 Constitutional sitting or standing blood pressure is within target range for patient.. pulse regular and within target range for patient.Marland Kitchen respirations regular, non-labored and within target range for patient.Marland Kitchen temperature within target range for patient.. Obese and well-hydrated in no acute distress. Eyes conjunctiva clear no eyelid edema noted. pupils equal round and reactive to light and accommodation. Ears, Nose, Mouth, and Throat no gross abnormality of ear auricles or external auditory canals. mucus membranes  moist. Respiratory normal breathing without difficulty. clear to auscultation bilaterally. Cardiovascular regular rate and rhythm with normal S1, S2. 2+ dorsalis pedis/posterior tibialis pulses. no clubbing, cyanosis, significant edema, <3 sec cap refill. Psychiatric Patient is not able to cooperate in decision  making regarding care. Patient has altered mental status. patient is confused. Notes Patient's left second toe wound appears to most likely be healed although it is difficult to tell if this is completely and firmly closed. I am likely going to get this one more week before calling this healed. Her left first toe wound does appear to be completely healed. The left heel wound appears to be doing well although this is more than deep tissue injury though there does not seem to be any pain type response with palpation over the injury site. Electronic Signature(s) Signed: 02/13/2017 11:24:19 AM By: Lenda Kelp PA-C Entered By: Lenda Kelp on 02/11/2017 14:58:22 Brooke Palmer, Brooke Palmer ([id number]) -------------------------------------------------------------------------------- Physician Orders Details Patient Name: Brooke Palmer Date of Service: 02/11/2017 8:15 AM Medical Record Number: [id number] Patient Account Number: [id number] Date of Birth/Sex: [birth year] (57 y.o. Female) Treating RN: Renne Crigler Primary Care Provider: Katina Dung Other Clinician: Referring Provider: Katina Dung Treating Provider/Extender: STONE III, HOYT Weeks in Treatment: 0 Verbal / Phone Orders: No Diagnosis Coding Wound Cleansing Wound #2 Left Toe Second o Clean wound with Normal Saline. Anesthetic (add to Medication List) o Topical Lidocaine 4% cream applied to wound bed prior to debridement (In Clinic Only). Primary Wound Dressing Wound #2 Left Toe Second o Other: - betadine Secondary Dressing Wound #2 Left Toe Second o Non-adherent pad Dressing Change Frequency Wound #2 Left  Toe Second o Change dressing every day. Follow-up Appointments Wound #2 Left Toe Second o Return Appointment in 1 week. Off-Loading o Other: - Prevalon cushioned boots bilateral Notes Following evaluation this patient may benefit from skilled nursing care per Allen Derry III PA. Apply Alleyn heel cup to right heel. Electronic Signature(s) Signed: 02/11/2017 4:30:41 PM By: Renne Crigler Signed: 02/13/2017 11:24:19 AM By: Lenda Kelp PA-C Entered By: Renne Crigler on 02/11/2017 09:48:47 Brooke Palmer, Brooke Palmer ([id number]) -------------------------------------------------------------------------------- Problem List Details Patient Name: Brooke Palmer Date of Service: 02/11/2017 8:15 AM Medical Record Number: [id number] Patient Account Number: [id number] Date of Birth/Sex: 1960/07/03 (57 y.o. Female) Treating RN: Renne Crigler Primary Care Provider: Katina Dung Other Clinician: Referring Provider: Katina Dung Treating Provider/Extender: Linwood Dibbles, HOYT Weeks in Treatment: 0 Active Problems ICD-10 Encounter Code Description Active Date Diagnosis L98.8 Other specified disorders of the skin and subcutaneous tissue 02/11/2017 Yes R41.841 Cognitive communication deficit 02/11/2017 Yes R13.13 Dysphagia, pharyngeal phase 02/11/2017 Yes I82.409 Acute embolism and thrombosis of unspecified deep veins of 02/11/2017 Yes unspecified lower extremity G40.909 Epilepsy, unspecified, not intractable, without status epilepticus 02/11/2017 Yes Inactive Problems Resolved Problems Electronic Signature(s) Signed: 02/13/2017 11:24:19 AM By: Lenda Kelp PA-C Entered By: Lenda Kelp on 02/11/2017 14:47:45 Brooke Palmer ([id number]) -------------------------------------------------------------------------------- Progress Note Details Patient Name: Brooke Palmer Date of Service: 02/11/2017 8:15 AM Medical Record Number: [id number] Patient Account Number: [id number] Date of  Birth/Sex: 27-Jul-1960 (57 y.o. 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 The  good news is the ulcerations on the left lower extremity appear to be doing well with one of the areas over the great toe region actually healed. In regard to the left second toe it appears this to may be healed although I'm not 100% confident in calling this so just based on this initial evaluation there does not appear to be any drainage however. Subsequently there is also a left heel deep tissue injury which appears to be minimal and does not appear to hurt her at this point which is good news. Unfortunately secondary to her mental status she is unable to rate or describe any discomfort she may be having. She is a resident at a group home. However with the severity of her impairment including the need for a Hoyer lift in order to move her around I am wondering if she should not actually be a skilled patient. No fevers, chills, nausea, or vomiting noted at this time. Patient did previously have pneumonia as well as acute kidney failure when she was most recently in the hospital prior to discharge and then subsequent follow-up with [place]. Wound History Patient  presents with 1 open wound that has been present for approximately 1 .5 weeks. The wound has been healed in the past but has re-opened. Laboratory tests have been performed in the last month. Patient reportedly has not tested positive for an antibiotic resistant organism. Patient reportedly has not tested positive for osteomyelitis. Patient reportedly has not had testing performed to evaluate circulation in the legs. Patient History Information obtained from Caregiver, Chart. Allergies No Known Allergies Social History Never smoker, Marital Status - Single, Alcohol Use - Never, Drug Use - No History, Caffeine Use - Never. Medical History Respiratory Patient has history of Aspiration - pneumonia resent hospitalization Denies history of Asthma, Chronic Obstructive Pulmonary Disease (COPD), Pneumothorax, Sleep Apnea, Tuberculosis Cardiovascular Patient has history of Deep Vein Thrombosis, Hypotension Denies history of Angina, Arrhythmia, Congestive Heart Failure, Coronary Artery Disease, Hypertension, Myocardial Infarction, Peripheral Arterial Disease, Peripheral Venous Disease, Phlebitis, Vasculitis Brooke Palmer, Brooke Palmer ([id number]) Gastrointestinal Denies history of Cirrhosis , Colitis, Crohn s, Hepatitis A, Hepatitis B, Hepatitis C Endocrine Denies history of Type I Diabetes, Type II Diabetes Genitourinary Patient has history of End Stage Renal Disease - hospitalization for kidney failure Immunological Denies history of Lupus Erythematosus, Raynaud s, Scleroderma Integumentary (Skin) Patient has history of History of pressure wounds Denies history of History of Burn Musculoskeletal Denies history of Gout, Rheumatoid Arthritis, Osteoarthritis Neurologic Patient has history of Dementia, Seizure Disorder Denies history of Neuropathy, Quadriplegia, Paraplegia Medical And Surgical History Notes Respiratory recent hospitalization for aspiration pneumonia and kidney  failure Cardiovascular cardiomegaly Neurologic Down Syndrome PTSD Review of Systems (ROS) Constitutional Symptoms (General Health) Denies complaints or symptoms of Fatigue, Fever, Chills, Marked Weight Change. Eyes The patient has no complaints or symptoms. Ear/Nose/Mouth/Throat The patient has no complaints or symptoms. Hematologic/Lymphatic The patient has no complaints or symptoms. Cardiovascular The patient has no complaints or symptoms. Gastrointestinal The patient has no complaints or symptoms. Endocrine Complains or has symptoms of Thyroid disease - hypothyroidism. Denies complaints or symptoms of Hepatitis, Polydypsia (Excessive Thirst). Immunological The patient has no complaints or symptoms. Integumentary (Skin) Complains or has symptoms of Wounds. Denies complaints or symptoms of Bleeding or bruising tendency, Breakdown, Swelling. Musculoskeletal The patient has no complaints or symptoms. Oncologic The patient has no complaints or symptoms. General Notes: unalbe to obtain family history due to no family came with patient to clinic visit Brooke Palmer, Brooke Palmer ([id number])  Objective Constitutional sitting or standing blood pressure is within target range for patient.. pulse regular and within target range for patient.Marland Kitchen. respirations regular, non-labored and within target range for patient.Marland Kitchen. temperature within target range for patient.. Obese and well-hydrated in no acute distress. Vitals Time Taken: 8:49 AM, Temperature: 98.3 F, Pulse: 70 bpm, Respiratory Rate: 16 breaths/min, Blood Pressure: 98/48 mmHg, Pulse Oximetry: 0 %. Eyes conjunctiva clear no eyelid edema noted. pupils equal round and reactive to light and accommodation. Ears, Nose, Mouth, and Throat no gross abnormality of ear auricles or external auditory canals. mucus membranes moist. Respiratory normal breathing without difficulty. clear to auscultation bilaterally. Cardiovascular regular rate and rhythm  with normal S1, S2. 2+ dorsalis pedis/posterior tibialis pulses. no clubbing, cyanosis, significant edema, Psychiatric Patient is not able to cooperate in decision making regarding care. Patient has altered mental status. patient is confused. General Notes: Patient's left second toe wound appears to most likely be healed although it is difficult to tell if this is completely and firmly closed. I am likely going to get this one more week before calling this healed. Her left first toe wound does appear to be completely healed. The left heel wound appears to be doing well although this is more than deep tissue injury though there does not seem to be any pain type response with palpation over the injury site. Integumentary (Hair, Skin) Wound #1 status is Healed - Epithelialized. Original cause of wound was Gradually Appeared. The wound is located on the [place] Great. The wound measures 0cm length x 0cm width x 0cm depth; 0cm^2 area and 0cm^3 volume. There is no tunneling or undermining noted. There is a none present amount of drainage noted. The wound margin is flat and intact. There is no granulation within the wound bed. There is a large (67-100%) amount of necrotic tissue within the wound bed including Eschar. The periwound skin appearance exhibited: Callus. The periwound skin appearance did not exhibit: Crepitus, Excoriation, Induration, Rash, Scarring, Dry/Scaly, Maceration, Atrophie Blanche, Cyanosis, Ecchymosis, Hemosiderin Staining, Mottled, Pallor, Rubor, Erythema. Wound #2 status is Open. Original cause of wound was Pressure Injury. The wound is located on the Left Toe Second. The wound measures 0.5cm length x 0.4cm width x 0.1cm depth; 0.157cm^2 area and 0.016cm^3 volume. There is no tunneling or undermining noted. There is a none present amount of drainage noted. The wound margin is flat and intact. There is no granulation within the wound bed. There is a large (67-100%) amount of necrotic  tissue within the wound bed including Eschar. The periwound skin appearance exhibited: Callus. The periwound skin appearance did not exhibit: Crepitus, Excoriation, Induration, Rash, Scarring, Dry/Scaly, Maceration, Atrophie Blanche, Cyanosis, Ecchymosis, Hemosiderin Staining, Mottled, Pallor, Rubor, Erythema. Other Condition(s) Patient presents with Suspected Deep Tissue Injury located on the Left heel. The skin appearance exhibited: Dry/Scaly, Ecchymosis. The skin appearance did not exhibit: Atrophie Blanche, Callus, Crepitus, Cyanosis, Erythema, Excoriation, Friable, Hemosiderin Staining, Induration, Maceration, Mottled, Pallor, Rash, Rubor, Scarring. Skin temperature was noted as No Abnormality. General Notes: 1.5 x 1 x 0.1 Brooke Palmer, Brooke Palmer ([id number]) Assessment Active Problems ICD-10 L98.8 - Other specified disorders of the skin and subcutaneous tissue R41.841 - Cognitive communication deficit R13.13 - Dysphagia, pharyngeal phase I82.409 - Acute embolism and thrombosis of unspecified deep veins of unspecified lower extremity G40.909 - Epilepsy, unspecified, not intractable, without status epilepticus Plan Wound Cleansing: Wound #2 Left Toe Second: Clean wound with Normal Saline. Anesthetic (add to Medication List): Topical Lidocaine 4% cream applied to wound bed prior  to debridement (In Clinic Only). Primary Wound Dressing: Wound #2 Left Toe Second: Other: - betadine Secondary Dressing: Wound #2 Left Toe Second: Non-adherent pad Dressing Change Frequency: Wound #2 Left Toe Second: Change dressing every day. Follow-up Appointments: Wound #2 Left Toe Second: Return Appointment in 1 week. Off-Loading: Other: - Prevalon cushioned boots bilateral General Notes: Following evaluation this patient may benefit from skilled nursing care per [facility] PA. Apply Alleyn heel cup to right heel. At this point I'm gonna recommend that we initiate treatment per above for the  patient. With that being said I think one of the most crucial elements is going to be offloading including floating the heels as well is the Prevalon Boots that I'm recommending for her. Hopefully this is something they will be able to obtain. Subsequently I also question whether this patient may need to have skilled nursing care. Obviously this is in the realm of the primary care physician at the group home. Nonetheless I feel that with the wounds and everything else that she is experiencing at this point that skilled nursing care may be of great benefit for her. Otherwise we will see her for reevaluation in one week. Please see above for specific wound care orders. We will see patient for re-evaluation in 1 week(s) here in the clinic. If anything worsens or changes patient will contact our office for additional recommendations. Electronic Signature(s) Brooke Palmer, Brooke Palmer ([id number]) Signed: 02/13/2017 11:24:19 AM By: Lenda Kelp PA-C Entered By: Lenda Kelp on 02/11/2017 15:00:00 Brooke Palmer, Brooke Palmer ([id number]) -------------------------------------------------------------------------------- ROS/PFSH Details Patient Name: Brooke Palmer Date of Service: 02/11/2017 8:15 AM Medical Record Number: [id number] Patient Account Number: [id number] Date of Birth/Sex: 25-Aug-1960 (57 y.o. Female) Treating RN: Renne Crigler Primary Care Provider: Katina Dung Other Clinician: Referring Provider: Katina Dung Treating Provider/Extender: STONE III, HOYT Weeks in Treatment: 0 Information Obtained From Caregiver Chart Wound History Do you currently have one or more open woundso Yes How many open wounds do you currently haveo 1 Approximately how long have you had your woundso 1 .5 weeks Has your wound(s) ever healed and then re-openedo Yes Have you had any lab work done in the past montho Yes Who ordered the lab work doneo in hospital Have you tested positive for an antibiotic resistant organism  (MRSA, VRE)o No Have you tested positive for osteomyelitis (bone infection)o No Have you had any tests for circulation on your legso No Constitutional Symptoms (General Health) Complaints and Symptoms: Negative for: Fatigue; Fever; Chills; Marked Weight Change Gastrointestinal Complaints and Symptoms: No Complaints or Symptoms Complaints and Symptoms: Negative for: Frequent diarrhea; Nausea; Vomiting Medical History: Negative for: Cirrhosis ; Colitis; Crohnos; Hepatitis A; Hepatitis B; Hepatitis C Endocrine Complaints and Symptoms: Positive for: Thyroid disease - hypothyroidism Negative for: Hepatitis; Polydypsia (Excessive Thirst) Medical History: Negative for: Type I Diabetes; Type II Diabetes Integumentary (Skin) Complaints and Symptoms: Positive for: Wounds Negative for: Bleeding or bruising tendency; Breakdown; Swelling Medical History: Positive for: History of pressure wounds Negative for: History of Burn Eyes Brooke Palmer, Brooke Palmer ([id number]) Complaints and Symptoms: No Complaints or Symptoms Ear/Nose/Mouth/Throat Complaints and Symptoms: No Complaints or Symptoms Hematologic/Lymphatic Complaints and Symptoms: No Complaints or Symptoms Respiratory Medical History: Positive for: Aspiration - pneumonia resent hospitalization Negative for: Asthma; Chronic Obstructive Pulmonary Disease (COPD); Pneumothorax; Sleep Apnea; Tuberculosis Past Medical History Notes: recent hospitalization for aspiration pneumonia and kidney failure Cardiovascular Complaints and Symptoms: No Complaints or Symptoms Medical History: Positive for: Deep Vein Thrombosis; Hypotension Negative for: Angina; Arrhythmia; Congestive Heart Failure;  Coronary Artery Disease; Hypertension; Myocardial Infarction; Peripheral Arterial Disease; Peripheral Venous Disease; Phlebitis; Vasculitis Past Medical History Notes: cardiomegaly Genitourinary Medical History: Positive for: End Stage Renal Disease -  hospitalization for kidney failure Immunological Complaints and Symptoms: No Complaints or Symptoms Medical History: Negative for: Lupus Erythematosus; Raynaudos; Scleroderma Musculoskeletal Complaints and Symptoms: No Complaints or Symptoms Medical History: Negative for: Gout; Rheumatoid Arthritis; Osteoarthritis Neurologic Medical History: Positive for: Dementia; Seizure Disorder Negative for: Neuropathy; Quadriplegia; Paraplegia Past Medical History Notes: Brooke Palmer, Brooke Palmer ([id number]) Down Syndrome PTSD Oncologic Complaints and Symptoms: No Complaints or Symptoms Immunizations Pneumococcal Vaccine: Received Pneumococcal Vaccination: Yes Implantable Devices Family and Social History Never smoker; Marital Status - Single; Alcohol Use: Never; Drug Use: No History; Caffeine Use: Never; Financial Concerns: No; Food, Clothing or Shelter Needs: No; Support System Lacking: No; Transportation Concerns: No; Advanced Directives: No; Patient does not want information on Advanced Directives; Do not resuscitate: No; Living Will: No; Medical Power of Attorney: No Notes unalbe to obtain family history due to no family came with patient to clinic visit Electronic Signature(s) Signed: 02/11/2017 4:30:41 PM By: Renne Crigler Signed: 02/13/2017 11:24:19 AM By: Lenda Kelp PA-C Entered By: Renne Crigler on 02/11/2017 10:55:51 Brooke Palmer ([id number]) -------------------------------------------------------------------------------- SuperBill Details Patient Name: Brooke Palmer Date of Service: 02/11/2017 Medical Record Number: [id number] Patient Account Number: [id number] Date of Birth/Sex: Jun 03, 1960 (57 y.o. Female) Treating RN: Renne Crigler Primary Care Provider: Katina Dung Other Clinician: Referring Provider: Katina Dung Treating Provider/Extender: Linwood Dibbles, HOYT Weeks in Treatment: 0 Diagnosis Coding ICD-10 Codes Code Description L98.8 Other specified  disorders of the skin and subcutaneous tissue R41.841 Cognitive communication deficit R13.13 Dysphagia, pharyngeal phase I82.409 Acute embolism and thrombosis of unspecified deep veins of unspecified lower extremity G40.909 Epilepsy, unspecified, not intractable, without status epilepticus Facility Procedures CPT4 Code: [id number] Description: 99214 - WOUND CARE VISIT-LEV 4 EST PT Modifier: Quantity: 1 Physician Procedures CPT4 Code Description: [id number] WC PHYS LEVEL 3 o NEW PT ICD-10 Diagnosis Description L98.8 Other specified disorders of the skin and subcutaneous tiss R41.841 Cognitive communication deficit R13.13 Dysphagia, pharyngeal phase I82.409 Acute embolism and  thrombosis of unspecified deep veins of Modifier: ue unspecified lower Quantity: 1 extremity Electronic Signature(s) Signed: 02/13/2017 11:24:19 AM By: Lenda Kelp PA-C Entered By: Lenda Kelp on 02/11/2017 15:00:31

## 2017-02-14 ENCOUNTER — Inpatient Hospital Stay: Payer: Medicare Other | Attending: Hematology | Admitting: Hematology

## 2017-02-14 ENCOUNTER — Encounter: Payer: Self-pay | Admitting: Hematology

## 2017-02-14 VITALS — BP 107/58 | HR 73 | Temp 99.0°F | Resp 18

## 2017-02-14 DIAGNOSIS — I824Y3 Acute embolism and thrombosis of unspecified deep veins of proximal lower extremity, bilateral: Secondary | ICD-10-CM | POA: Diagnosis not present

## 2017-02-14 DIAGNOSIS — Q909 Down syndrome, unspecified: Secondary | ICD-10-CM

## 2017-02-14 DIAGNOSIS — E039 Hypothyroidism, unspecified: Secondary | ICD-10-CM

## 2017-02-14 NOTE — Progress Notes (Signed)
HEMATOLOGY/ONCOLOGY CONSULTATION NOTE  Date of Service: 02/14/2017  Patient Care Team: Royals, Gretta Began as PCP - General (Family Medicine)  CHIEF COMPLAINTS/PURPOSE OF CONSULTATION:  Bilateral DVT   HISTORY OF PRESENTING ILLNESS:   Brooke Palmer is a wonderful 57 y.o. female who has been referred to [place] by PCP, Dr. Katina Dung at [facility] group home, for evaluation and management of Deep Vein Thrombosis.   She presents to the clinic today by her care giver. Dr. Katina Dung is the PCP at the group home she recently switched to. The patient is not able to give information given her significant cognitive issues and non verbal status due to Down' syndrome. She is on a pureed diet and her medication is crushed in put with her feeding. The caregiver is new to the pt so she called patient's nurse Bjorn Loser in clinic today for better history.   The nurse notes pt has discoloration in both of her feet and this was noted to have been present for months or longer. She had a doppler done on 01/02/17 which showed multiple clots in both lower extremities and an occurrence in her femoral artery. She was taken to the hospital and put on Xarelto. She has been on Xarelto since 01/02/17. The nurse notes prior group home reported she has had this  discoloration for a long period of time. The Nurse will fax the doppler report once she obtains it. The nurse does not know of pt having a history of blood clots. She does have chronic issue in her right upper leg. She is mostly wheelchair or bed bound.   Doppler report subsequently obtained showed -1. 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 2.  Acute nonocclusive thrombosis noted in the left greater saphenous vein.  Care giver notes her feet stays cracked and may burst often form dryness. She does eat well but will take some time to swallow food. She drinks through a sippy cup.   No family history available.  No previous  available history of venous thromboembolism. No observable overt shortness of breath.  Patient cannot report the presence of any chest pain at this time.  Oxygenating well.  no tachycardia.   MEDICAL HISTORY:  Past Medical History:  Diagnosis Date  . Arthritis    osteo  . DVT (deep venous thrombosis) (HCC)    bilateral legs  . Dysphasia   . Insomnia   . PTSD (post-traumatic stress disorder)   . Seizures (HCC)   Down syndrome Hearing loss Hypothyroidism  SURGICAL HISTORY: Cataract extraction  SOCIAL HISTORY: Social History   Socioeconomic History  . Marital status: Single    Spouse name: Not on file  . Number of children: Not on file  . Years of education: Not on file  . Highest education level: Not on file  Social Needs  . Financial resource strain: Not on file  . Food insecurity - worry: Not on file  . Food insecurity - inability: Not on file  . Transportation needs - medical: Not on file  . Transportation needs - non-medical: Not on file  Occupational History  . Not on file  Tobacco Use  . Smoking status: Never Smoker  . Smokeless tobacco: Never Used  Substance and Sexual Activity  . Alcohol use: No    Frequency: Never  . Drug use: No  . Sexual activity: No  Other Topics Concern  . Not on file  Social History Narrative  . 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 Never smoker no alcohol use.   FAMILY HISTORY: Unknown family history.  ALLERGIES:  has No Known Allergies.  MEDICATIONS:  Current Outpatient Medications  Medication Sig Dispense Refill  . acetic acid (VOSOL) 2 % otic solution Place 4 drops into both ears 2 (two) times daily.    . Calcium Carbonate-Vitamin D (OSCAL 500/200 D-3 PO) Take 1 tablet by mouth 2 (two) times daily.     . Cholecalciferol (VITAMIN D3) 2000 units TABS Take 2,000 Units by mouth daily after breakfast.    . coal tar (NEUTROGENA T-GEL) 0.5 % shampoo Apply 1 application topically at  bedtime as needed (scalp, dandruff).     . docusate sodium (COLACE) 100 MG capsule Take 100 mg by mouth daily.    Marland Kitchen erythromycin ophthalmic ointment Place a 1/2 inch ribbon of ointment into the lower eyelid QID for one week. 1 g 0  . fludrocortisone (FLORINEF) 0.1 MG tablet Take 0.1 mg by mouth daily.    Marland Kitchen lamoTRIgine (LAMICTAL) 100 MG tablet Take 100 mg by mouth daily.    Marland Kitchen levETIRAcetam (KEPPRA) 1000 MG tablet Take 1 tablet (1,000 mg total) by mouth 2 (two) times daily. 60 tablet 0  . levofloxacin (LEVAQUIN) 750 MG tablet Take 1 tablet (750 mg total) by mouth daily. 6 tablet 0  . levothyroxine (SYNTHROID, LEVOTHROID) 100 MCG tablet Take 100 mcg by mouth daily.    . Melatonin 3 MG TABS Take 3 mg by mouth daily.    . memantine (NAMENDA) 5 MG tablet Take 5 mg by mouth 2 (two) times daily.    . Menthol-Zinc Oxide (RISAMINE) 0.44-20.625 % OINT Apply 1 application topically 2 (two) times daily.    . metroNIDAZOLE (METROCREAM) 0.75 % cream Apply 1 application topically 2 (two) times daily.    . montelukast (SINGULAIR) 10 MG tablet Take 10 mg by mouth daily.    Marland Kitchen omeprazole (PRILOSEC) 20 MG capsule Take 20 mg by mouth daily.    . potassium chloride SA (K-DUR,KLOR-CON) 20 MEQ tablet Take 1 tablet (20 mEq total) by mouth daily. 7 tablet 0  . rivaroxaban (XARELTO) 20 MG TABS tablet Take 20 mg by mouth daily with supper.    . sertraline (ZOLOFT) 25 MG tablet Take 25 mg by mouth daily.     No current facility-administered medications for this visit.     REVIEW OF SYSTEMS:    10 Point review of Systems was done is negative except as noted above.  PHYSICAL EXAMINATION: ECOG PERFORMANCE STATUS: 1 - Symptomatic but completely ambulatory  . Vitals:   02/14/17 1032  BP: (!) 107/58  Pulse: 73  Resp: 18  Temp: 99 F (37.2 C)  SpO2: 98%    GENERAL:alert, in no acute distress and comfortable (+) wheelchair/bed bound SKIN: no acute rashes, no significant lesions EYES: conjunctiva are pink and  non-injected, sclera anicteric OROPHARYNX: MMM, no exudates, no oropharyngeal erythema or ulceration NECK: supple, no JVD LYMPH:  no palpable lymphadenopathy in the cervical, axillary or inguinal regions LUNGS: clear to auscultation b/l with normal respiratory effort HEART: regular rate & rhythm ABDOMEN:  normoactive bowel sounds , non tender, not distended. Extremity: 1+ pedal edema is (+) significant bilateral bluish/dusky discoloration of feet with decreased peripheral pulses. PSYCH: alert & oriented x 3 with fluent speech NEURO: unable to perform an accurate neuro examination.  LABORATORY DATA:  I have reviewed the data as listed  . CBC Latest Ref Rng &  Units 02/04/2017 02/03/2017 02/02/2017  WBC 4.0 - 10.5 K/uL 7.5 12.0(H) 15.8(H)  Hemoglobin 12.0 - 15.0 g/dL 10.3(L) 10.4(L) 11.9(L)  Hematocrit 36.0 - 46.0 % 31.6(L) 31.9(L) 36.7  Platelets 150 - 400 K/uL 256 255 279    . CMP Latest Ref Rng & Units 02/06/2017 02/05/2017 02/04/2017  Glucose 65 - 99 mg/dL 93 97 97  BUN 6 - 20 mg/dL 17 17 19   Creatinine 0.44 - 1.00 mg/dL 1.61 0.96 0.45  Sodium 135 - 145 mmol/L 140 141 146(H)  Potassium 3.5 - 5.1 mmol/L 3.4(L) 3.3(L) 3.6  Chloride 101 - 111 mmol/L 112(H) 111 117(H)  CO2 22 - 32 mmol/L 22 24 23   Calcium 8.9 - 10.3 mg/dL 8.1(L) 8.1(L) 8.3(L)  Total Protein 6.5 - 8.1 g/dL - - -  Total Bilirubin 0.3 - 1.2 mg/dL - - -  Alkaline Phos 38 - 126 U/L - - -  AST 15 - 41 U/L - - -  ALT 14 - 54 U/L - - -     RADIOGRAPHIC STUDIES: I have personally reviewed the radiological images as listed and agreed with the findings in the report.  Doppler 01/03/17 IMPRESSION:  1. Subacute to chronic non-occlusive deep vein thrombosis is noted within the right common femoral vein.  2. Acute non-occlusive deep vein thrombosis is notes in the left greater saphenous vein (superficial vein).    ASSESSMENT & PLAN:   Brooke Palmer is a 57 y.o.  Caucasian female with   1. Bilateral DVT Presented  to ED on 01/02/17 after doppler found her to have multiple blood clots in her lower extremities.  US shows RLE subacute/chronic DVT  LLE superficial venous thrombosis - only.  Primary risk factor would be her bed bound status/wheelchair bound status and immobility - which has been ongoing. Symptoms suggest that she likely had DVT for a while and might be primarily chronic.  Downs syndrome related hypercoag state could be an additional consideration though on previous hx available on VTE in earlier life. Would be a less likely new factor at this time.  PLAN:  -explained findings of [place] and treatment consideration to the patients accompanying caregiver and also discussed these with the nurse at the patients facility. -she was started on Xarelto in the ED. -if there are no concerns with falls, bleeding and renal and liver function are stable - we recommend she continues Xarelto for a total of 6 months. After 6 months we will recommend switching to aspirin 81mg  daily.  -Will monitor labs and blood counts with her PCP -I recommend vascular surgeon consultation to evaluate peripheral arterial disease given her clinical findings. -Will repeat doppler in 4 months to monitor for any change in clot burden. -could consider use of anti-embolism stocking provided there isn't severe PAD. -US venous b/l lower extremities in 16 weeks RTC with Dr Candise Che in 4 months with labs and [place] ext   2. Past Medical History:  Diagnosis Date  . Arthritis    osteo  . DVT (deep venous thrombosis) (HCC)    bilateral legs  . Dysphasia   . Insomnia   . PTSD (post-traumatic stress disorder)   . Seizures (HCC)   -Down's Syndrome  -Wheelchair/Bed bound -Obesity -hypothyroidism PLAN:  -She is currently at [facility] under the care of PCP Dr. Dayna Barker    US venous b/l lower extremities in 16 weeks RTC with Dr Candise Che in 4 months with labs and [place] ext   I spent 35 minutes counseling the patient  face to face.  The total time spent in the appointment was 45 minutes and more than 50% was on counseling and direct patient cares.    Wyvonnia LoraGautam Berdine Rasmusson MD MS AAHIVMS [facility] Hematology/Oncology Physician [facility]  (Office):       [phone] (Work cell):  [phone] (Fax):           [phone]  02/14/2017 11:37 AM  This document serves as a record of services personally performed by Wyvonnia LoraGautam Zabrina Brotherton, MD. It was created on his behalf by Delphina CahillAmoya Bennett, a trained medical scribe. The creation of this record is based on the scribe's personal observations and the provider's statements to them.    .I have reviewed the above documentation for accuracy and completeness, and I agree with the above. Johney Maine.Darnetta Kesselman Kishore Evita Merida MD MS

## 2017-02-18 ENCOUNTER — Ambulatory Visit (HOSPITAL_COMMUNITY): Admission: RE | Admit: 2017-02-18 | Payer: Medicare Other | Source: Ambulatory Visit

## 2017-02-18 ENCOUNTER — Emergency Department (HOSPITAL_COMMUNITY): Payer: Medicare Other

## 2017-02-18 ENCOUNTER — Other Ambulatory Visit: Payer: Self-pay

## 2017-02-18 ENCOUNTER — Ambulatory Visit: Payer: Medicare Other | Admitting: Physician Assistant

## 2017-02-18 ENCOUNTER — Ambulatory Visit (HOSPITAL_COMMUNITY): Payer: Medicare Other

## 2017-02-18 ENCOUNTER — Encounter (HOSPITAL_COMMUNITY): Payer: Self-pay

## 2017-02-18 ENCOUNTER — Emergency Department (HOSPITAL_COMMUNITY)
Admission: EM | Admit: 2017-02-18 | Discharge: 2017-02-18 | Disposition: A | Discharge status: Home / Self Care | Payer: Medicare Other | Attending: Emergency Medicine | Admitting: Emergency Medicine

## 2017-02-18 DIAGNOSIS — E039 Hypothyroidism, unspecified: Secondary | ICD-10-CM | POA: Diagnosis not present

## 2017-02-18 DIAGNOSIS — R509 Fever, unspecified: Secondary | ICD-10-CM | POA: Diagnosis present

## 2017-02-18 DIAGNOSIS — Z7901 Long term (current) use of anticoagulants: Secondary | ICD-10-CM | POA: Insufficient documentation

## 2017-02-18 DIAGNOSIS — Z79899 Other long term (current) drug therapy: Secondary | ICD-10-CM | POA: Diagnosis not present

## 2017-02-18 LAB — COMPREHENSIVE METABOLIC PANEL
ALBUMIN: 2.4 g/dL — AB (ref 3.5–5.0)
ALT: 18 U/L (ref 14–54)
AST: 19 U/L (ref 15–41)
Alkaline Phosphatase: 138 U/L — ABNORMAL HIGH (ref 38–126)
Anion gap: 7 (ref 5–15)
BILIRUBIN TOTAL: 0.5 mg/dL (ref 0.3–1.2)
BUN: 18 mg/dL (ref 6–20)
CO2: 25 mmol/L (ref 22–32)
Calcium: 8.6 mg/dL — ABNORMAL LOW (ref 8.9–10.3)
Chloride: 117 mmol/L — ABNORMAL HIGH (ref 101–111)
Creatinine, Ser: 0.9 mg/dL (ref 0.44–1.00)
GFR calc Af Amer: >60 mL/min (ref >60)
GFR calc non Af Amer: >60 mL/min (ref >60)
GLUCOSE: 91 mg/dL (ref 65–99)
POTASSIUM: 4 mmol/L (ref 3.5–5.1)
SODIUM: 149 mmol/L — AB (ref 135–145)
TOTAL PROTEIN: 7.5 g/dL (ref 6.5–8.1)

## 2017-02-18 LAB — URINALYSIS, ROUTINE W REFLEX MICROSCOPIC
BILIRUBIN URINE: NEGATIVE
Glucose, UA: NEGATIVE mg/dL
Ketones, ur: NEGATIVE mg/dL
Nitrite: NEGATIVE
PH: 7 (ref 5.0–8.0)
Protein, ur: NEGATIVE mg/dL
SPECIFIC GRAVITY, URINE: 1.016 (ref 1.005–1.030)

## 2017-02-18 LAB — CBC WITH DIFFERENTIAL/PLATELET
BASOS ABS: 0.1 10*3/uL (ref 0.0–0.1)
Basophils Relative: 1 %
EOS PCT: 2 %
Eosinophils Absolute: 0.2 10*3/uL (ref 0.0–0.7)
HEMATOCRIT: 37.5 % (ref 36.0–46.0)
Hemoglobin: 11.9 g/dL — ABNORMAL LOW (ref 12.0–15.0)
LYMPHS ABS: 2.6 10*3/uL (ref 0.7–4.0)
LYMPHS PCT: 35 %
MCH: 33.1 pg (ref 26.0–34.0)
MCHC: 31.7 g/dL (ref 30.0–36.0)
MCV: 104.5 fL — AB (ref 78.0–100.0)
MONO ABS: 0.8 10*3/uL (ref 0.1–1.0)
MONOS PCT: 11 %
NEUTROS ABS: 3.8 10*3/uL (ref 1.7–7.7)
Neutrophils Relative %: 51 %
PLATELETS: 341 10*3/uL (ref 150–400)
RBC: 3.59 MIL/uL — ABNORMAL LOW (ref 3.87–5.11)
RDW: 14.5 % (ref 11.5–15.5)
WBC: 7.3 10*3/uL (ref 4.0–10.5)

## 2017-02-18 LAB — I-STAT CG4 LACTIC ACID, ED: Lactic Acid, Venous: 1.68 mmol/L (ref 0.5–1.9)

## 2017-02-18 MED ORDER — SODIUM CHLORIDE 0.9 % IV BOLUS (SEPSIS)
1000.0000 mL | Freq: Once | INTRAVENOUS | Status: AC
  Administered 2017-02-18: 1000 mL via INTRAVENOUS

## 2017-02-18 MED ORDER — DEXTROSE 5 % IV SOLN
2.0000 g | Freq: Once | INTRAVENOUS | Status: AC
  Administered 2017-02-18: 2 g via INTRAVENOUS
  Filled 2017-02-18: qty 2

## 2017-02-18 NOTE — Progress Notes (Signed)
Consult from CM has been received. CSW notes cm stating,"pt from group home and has a legal guardian - fyi"  Please reconsult if future social work needs arise.  CSW signing off, as social work intervention is no longer needed.  Brooke PeaJonathan F. Colbey Wirtanen, LCSW, LCAS, CSI Clinical Social Worker Ph: [phone]

## 2017-02-18 NOTE — ED Notes (Signed)
Bed: WA17 Expected date:  Expected time:  Means of arrival:  Comments: triage 

## 2017-02-18 NOTE — ED Notes (Signed)
Pt aware a urine sample is needed. Wik placed for urine collection.

## 2017-02-18 NOTE — ED Notes (Signed)
Bed: WA16 Expected date:  Expected time:  Means of arrival:  Comments: EMS 

## 2017-02-18 NOTE — ED Provider Notes (Signed)
[facility]-EMERGENCY DEPT Provider Note   CSN: [id number] Arrival date & time: 02/18/17  1350     History   Chief Complaint Chief Complaint  Patient presents with  . Fever  . Abnormal Lab    HPI Brooke Palmer is a 57 y.o. female.  The patient lives in a group home, and presents here for evaluation of fever, "100.3", and decreased eating and drinking.  Typically the patient is nonverbal, but more alert than she is today.  She was treated with Tylenol 650 mg earlier today and her temperature improved.  There is no other history of the current illness available.  Level 5 caveat-altered mental status   HPI  Past Medical History:  Diagnosis Date  . Arthritis    osteo  . DVT (deep venous thrombosis) (HCC)    bilateral legs  . Dysphasia   . Insomnia   . PTSD (post-traumatic stress disorder)   . Seizures [facility])     Patient Active Problem List   Diagnosis Date Noted  . ARF (acute renal failure) (HCC) 02/02/2017  . Dehydration 02/02/2017  . Leucocytosis 02/02/2017  . Seizure disorder (HCC) 02/02/2017  . Hypothyroid 02/02/2017  . GERD (gastroesophageal reflux disease) 02/02/2017  . DVT (deep venous thrombosis) (HCC) 02/02/2017    History reviewed. No pertinent surgical history.  OB History    No data available       Home Medications    Prior to Admission medications   Medication Sig Start Date End Date Taking? Authorizing Provider  acetic acid (VOSOL) 2 % otic solution Place 4 drops into both ears 2 (two) times daily.   Yes [provider]  acetic acid-hydrocortisone (VOSOL-HC) OTIC solution Place 4 drops into both ears 3 (three) times daily as needed (ear infection prevention).    Yes [provider]  Calcium Carbonate-Vitamin D (OSCAL 500/200 D-3 PO) Take 1 tablet by mouth 2 (two) times daily.    Yes [provider]  carbamide peroxide (DEBROX) 6.5 % OTIC solution Place 5 drops into both ears 2 (two) times daily as  needed (ear infection prevention).    Yes [provider]  Cholecalciferol (VITAMIN D3) 2000 units TABS Take 2,000 Units by mouth daily after breakfast.   Yes [provider]  coal tar (NEUTROGENA T-GEL) 0.5 % shampoo Apply 1 application topically at bedtime as needed (scalp, dandruff).    Yes [provider]  diazepam (DIASTAT ACUDIAL) 10 MG GEL Place 10 mg rectally daily as needed for seizure.   Yes [provider]  docusate sodium (COLACE) 100 MG capsule Take 100 mg by mouth daily.   Yes [provider]  fludrocortisone (FLORINEF) 0.1 MG tablet Take 0.1 mg by mouth daily. 12/12/16  Yes [provider]  ibuprofen (ADVIL,MOTRIN) 800 MG tablet Take 800 mg by mouth every 6 (six) hours as needed for moderate pain.   Yes [provider]  lamoTRIgine (LAMICTAL) 100 MG tablet Take 100 mg by mouth daily. 12/12/16  Yes [provider]  levETIRAcetam (KEPPRA) 750 MG tablet Take 750 mg by mouth 2 (two) times daily.  01/30/17  Yes [provider]  levothyroxine (SYNTHROID, LEVOTHROID) 100 MCG tablet Take 100 mcg by mouth daily. 12/12/16  Yes [provider]  Lidocaine-Glycerin (PREPARATION H) 5-14.4 % CREA Place 1 application rectally daily as needed (hemorrhoids).    Yes [provider]  LORazepam (ATIVAN) 0.5 MG tablet Take 0.5 mg by mouth 2 (two) times daily as needed for seizure.  Yes [provider]  Melatonin 3 MG TABS Take 3 mg by mouth at bedtime.    Yes [provider]  memantine (NAMENDA) 5 MG tablet Take 5 mg by mouth 2 (two) times daily. 12/12/16  Yes [provider]  Menthol-Zinc Oxide (RISAMINE) 0.44-20.625 % OINT Apply 1 application topically 2 (two) times daily.   Yes [provider]  metroNIDAZOLE (METROCREAM) 0.75 % cream Apply 1 application topically 2 (two) times daily. 12/03/16  Yes [provider]  montelukast (SINGULAIR) 10 MG tablet Take 10 mg  by mouth daily. 12/12/16  Yes [provider]  omeprazole (PRILOSEC) 20 MG capsule Take 20 mg by mouth daily. 12/12/16  Yes [provider]  phenylephrine-shark liver oil-mineral oil-petrolatum (PREPARATION H) 0.25-3-14-71.9 % rectal ointment Place 1 application rectally 2 (two) times daily as needed for hemorrhoids.   Yes [provider]  rivaroxaban (XARELTO) 20 MG TABS tablet Take 20 mg by mouth daily with supper.   Yes [provider]  sertraline (ZOLOFT) 25 MG tablet Take 25 mg by mouth at bedtime.  12/12/16  Yes [provider]  doxycycline (VIBRA-TABS) 100 MG tablet  01/29/17   [provider]  erythromycin ophthalmic ointment Place a 1/2 inch ribbon of ointment into the lower eyelid QID for one week. Patient not taking: Reported on 02/18/2017 01/27/17   Alvira Monday, MD  levETIRAcetam (KEPPRA) 1000 MG tablet Take 1 tablet (1,000 mg total) by mouth 2 (two) times daily. Patient not taking: Reported on 02/18/2017 01/27/17   Alvira Monday, MD  levofloxacin (LEVAQUIN) 750 MG tablet Take 1 tablet (750 mg total) by mouth daily. Patient not taking: Reported on 02/18/2017 02/08/17   Meredith Leeds, MD  montelukast (SINGULAIR) 10 MG tablet Take by mouth.    [provider]  nabumetone (RELAFEN) 500 MG tablet  01/05/17   [provider]  potassium chloride SA (K-DUR,KLOR-CON) 20 MEQ tablet Take 1 tablet (20 mEq total) by mouth daily. Patient not taking: Reported on 02/18/2017 02/08/17   Meredith Leeds, MD  XARELTO 15 MG TABS tablet  01/08/17   [provider]    Family History No family history on file.  Social History Social History   Tobacco Use  . Smoking status: Never Smoker  . Smokeless tobacco: Never Used  Substance Use Topics  . Alcohol use: No    Frequency: Never  . Drug use: No     Allergies   Patient has no known allergies.   Review of Systems Review of Systems  Unable to perform  ROS: Mental status change  Psychiatric/Behavioral: Confusion:       Physical Exam Updated Vital Signs BP 132/68   Pulse 73   Temp 99.5 F (37.5 C) (Rectal)   Resp 17   Ht 5\' 2"  (1.575 m)   Wt 86.2 kg (190 lb)   SpO2 99%   BMI 34.75 kg/m   Physical Exam  Constitutional: She appears well-developed. No distress.  Overweight  HENT:  Head: Normocephalic and atraumatic.  Eyes: Conjunctivae and EOM are normal. Pupils are equal, round, and reactive to light.  Neck: Normal range of motion and phonation normal. Neck supple.  Cardiovascular: Normal rate and regular rhythm.  Pulmonary/Chest: Effort normal and breath sounds normal. No respiratory distress. She exhibits no tenderness.  Abdominal: Soft. She exhibits no distension. There is no tenderness. There is no guarding.  Musculoskeletal: She exhibits edema (Lateral lower legs). She exhibits no deformity.  Neurological: She is alert. She  exhibits normal muscle tone.  Nonverbal.  Does not cooperate with request to grip hands or move.  She groans when she is lifted up to examine the back.  Skin: Skin is warm and dry.  Psychiatric:  Sleepy  Nursing note and vitals reviewed.    ED Treatments / Results  Labs (all labs ordered are listed, but only abnormal results are displayed) Labs Reviewed  COMPREHENSIVE METABOLIC PANEL - Abnormal; Notable for the following components:      Result Value   Sodium 149 (*)    Chloride 117 (*)    Calcium 8.6 (*)    Albumin 2.4 (*)    Alkaline Phosphatase 138 (*)    All other components within normal limits  CBC WITH DIFFERENTIAL/PLATELET - Abnormal; Notable for the following components:   RBC 3.59 (*)    Hemoglobin 11.9 (*)    MCV 104.5 (*)    All other components within normal limits  CULTURE, BLOOD (ROUTINE X 2)  CULTURE, BLOOD (ROUTINE X 2)  URINE CULTURE  URINALYSIS, ROUTINE W REFLEX MICROSCOPIC  I-STAT CG4 LACTIC ACID, ED    EKG  EKG Interpretation None       Radiology Dg  Chest Port 1 View  Result Date: 02/18/2017 CLINICAL DATA:  57 year old female with altered mental status and fever. Recent pneumonia. EXAM: PORTABLE CHEST 1 VIEW COMPARISON:  02/07/2017 portable chest. FINDINGS: Portable AP semi upright view at 1706 hours. Stable lung volumes. Patchy left lung base opacity is stable. Similar patchy medial right lung base opacity is improved. No pneumothorax, pulmonary edema, pleural effusion or areas of worsening ventilation. Stable cardiac size and mediastinal contours. Negative visible bowel gas pattern. IMPRESSION: Stable to mildly improved patchy lung base opacity since 02/07/2017 which is nonspecific, and might be scarring or atelectasis rather than infection. No new cardiopulmonary abnormality. Electronically Signed   By: Odessa Fleming M.D.   On: 02/18/2017 17:30    Procedures Procedures (including critical care time)  Medications Ordered in ED Medications  sodium chloride 0.9 % bolus 1,000 mL (0 mLs Intravenous Stopped 02/18/17 1839)    And  sodium chloride 0.9 % bolus 1,000 mL (0 mLs Intravenous Stopped 02/18/17 1900)    And  sodium chloride 0.9 % bolus 1,000 mL (0 mLs Intravenous Stopped 02/18/17 1900)  cefTRIAXone (ROCEPHIN) 2 g in dextrose 5 % 50 mL IVPB (0 g Intravenous Stopped 02/18/17 1701)     Initial Impression / Assessment and Plan / ED Course  I have reviewed the triage vital signs and the nursing notes.  Pertinent labs & imaging results that were available during my care of the patient were reviewed by me and considered in my medical decision making (see chart for details).      Patient Vitals for the past 24 hrs:  BP Temp Temp src Pulse Resp SpO2 Height Weight  02/18/17 2148 132/68 - - 73 17 99 % - -  02/18/17 1915 99/87 - - 72 (!) 21 97 % - -  02/18/17 1900 106/82 - - 72 15 100 % - -  02/18/17 1832 (!) 110/93 - - 70 16 100 % - -  02/18/17 1730 122/81 - - 66 15 100 % - -  02/18/17 1652 (!) 120/59 99.5 F (37.5 C) Rectal 68 (!) 9 100 % - -    02/18/17 1650 - 99.6 F (37.6 C) - - - - - -  02/18/17 1447 - - - - - - 5\' 2"  (1.575 m) 86.2 kg (190 lb)  02/18/17 1445 114/61 98.7 F (37.1 C) Oral 75 18 96 % - -    10:12 PM Reevaluation with update and discussion. After initial assessment and treatment, an updated evaluation reveals at this point she is able to tolerate oral hydration and nutrition.  Findings discussed with her caregiver and all questions answered. Mancel BaleElliott Vivi Piccirilli     Final Clinical Impressions(s) / ED Diagnoses   Final diagnoses:  Fever, unspecified fever cause   Fever, unspecified cause, with reassuring evaluation.  Doubt serious bacterial infection, metabolic instability or impending vascular collapse.  Nursing Notes Reviewed/ Care Coordinated Applicable Imaging Reviewed Interpretation of Laboratory Data incorporated into ED treatment  The patient appears reasonably screened and/or stabilized for discharge and I doubt any other medical condition or other [facility] requiring further screening, evaluation, or treatment in the ED at this time prior to discharge.  Plan: Home Medications-continue usual medications, APAP for fever; Home Treatments-rest, push fluids; return here if the recommended treatment, does not improve the symptoms; Recommended follow up-PCP, as needed   ED Discharge Orders    None       Mancel BaleWentz, Brinklee Cisse, MD 02/18/17 2213

## 2017-02-18 NOTE — Discharge Instructions (Signed)
Take Tylenol every 4 hours if needed for fever.  Encourage her to drink plenty of fluids, and try to eat.

## 2017-02-18 NOTE — ED Triage Notes (Addendum)
Patient lives in a group home. Group home staff report that the patient has been unresponsive and having a fever. (100.3) Patient opens eyes when touched, but staff say she is normally more alert than what she is in triage. Patient was recently hospitalized for pneumonia. Patient is non verbal and does not follow any commands

## 2017-02-20 ENCOUNTER — Telehealth: Payer: Self-pay | Admitting: Hematology

## 2017-02-20 LAB — URINE CULTURE

## 2017-02-20 NOTE — Telephone Encounter (Signed)
Called regarding 5/3

## 2017-02-23 LAB — CULTURE, BLOOD (ROUTINE X 2)
CULTURE: NO GROWTH
CULTURE: NO GROWTH
Special Requests: ADEQUATE
Special Requests: ADEQUATE

## 2017-02-25 ENCOUNTER — Encounter: Payer: Medicare Other | Attending: Physician Assistant | Admitting: Physician Assistant

## 2017-02-25 DIAGNOSIS — R1313 Dysphagia, pharyngeal phase: Secondary | ICD-10-CM | POA: Diagnosis not present

## 2017-02-25 DIAGNOSIS — G40909 Epilepsy, unspecified, not intractable, without status epilepticus: Secondary | ICD-10-CM | POA: Insufficient documentation

## 2017-02-25 DIAGNOSIS — L988 Other specified disorders of the skin and subcutaneous tissue: Secondary | ICD-10-CM | POA: Diagnosis present

## 2017-02-25 DIAGNOSIS — I82409 Acute embolism and thrombosis of unspecified deep veins of unspecified lower extremity: Secondary | ICD-10-CM | POA: Diagnosis not present

## 2017-02-25 DIAGNOSIS — F0390 Unspecified dementia without behavioral disturbance: Secondary | ICD-10-CM | POA: Diagnosis not present

## 2017-02-25 DIAGNOSIS — R41841 Cognitive communication deficit: Secondary | ICD-10-CM | POA: Diagnosis not present

## 2017-02-25 DIAGNOSIS — N186 End stage renal disease: Secondary | ICD-10-CM | POA: Diagnosis not present

## 2017-02-25 DIAGNOSIS — E039 Hypothyroidism, unspecified: Secondary | ICD-10-CM | POA: Diagnosis not present

## 2017-02-26 NOTE — Progress Notes (Addendum)
Brooke, Palmer ([id number]) Visit Report for 02/25/2017 Chief Complaint Document Details Patient Name: Brooke Palmer, Brooke Palmer Date of Service: 02/25/2017 9:15 AM Medical Record Number: [id number] Patient Account Number: [id number] Date of Birth/Sex: 1961/02/05 (57 y.o. Female) Treating RN: Renne Crigler Primary Care Provider: Katina Dung Other Clinician: Referring Provider: Katina Dung Treating Provider/Extender: Linwood Dibbles, HOYT Weeks in Treatment: 2 Information Obtained from: Patient Chief Complaint Left second toe ulcer and right heel DTI Electronic Signature(s) Signed: 02/26/2017 10:30:07 AM By: Lenda Kelp PA-C Entered By: Lenda Kelp on 02/25/2017 09:40:26 Brooke Palmer ([id number]) -------------------------------------------------------------------------------- HPI Details Patient Name: Brooke Palmer Date of Service: 02/25/2017 9:15 AM Medical Record Number: [id number] Patient Account Number: [id number] Date of Birth/Sex: March 07, 1960 (57 y.o. 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 The good news is the ulcerations on the left lower extremity appear to be doing well with one of the areas over the great toe region actually healed. In regard to the left second toe it appears this to may be healed although I'm not 100% confident in calling this so just based on this  initial evaluation there does not appear to be any drainage however. Subsequently there is also a left heel deep tissue injury which appears to be minimal and does not appear to hurt her at this point which is good news. Unfortunately secondary to her mental status she is unable to rate or describe any discomfort she may be having. She is a resident at a group home. However with the severity of her impairment including the need for a Hoyer lift in order to move her around I am wondering if she should not actually be a skilled patient. No fevers, chills, nausea, or vomiting noted at this time. Patient did previously have pneumonia as well as acute kidney failure when she was most recently in the hospital prior to discharge and then subsequent follow-up with [place]. 02/25/17 the good news is the wounds that I was previously evaluating patient forcing to be doing better and have healed. Unfortunately she has a blister on the left heel and what appears to be a blood blister on the right heel both immediately that are new neither appear to be open in both appear to be dry and intact for the time being. There is no evidence of infection at this point. Patient does not seem to have any significant pain which is good news. Electronic Signature(s) Signed: 02/26/2017 10:30:07 AM By: Lenda Kelp PA-C Entered By: Lenda Kelp on 02/25/2017 10:15:33 Brooke Palmer ([id number]) -------------------------------------------------------------------------------- Physical Exam Details Patient Name: Brooke Palmer Date of Service: 02/25/2017 9:15 AM Medical Record Number: [id number] Patient Account Number: [id number] Date of Birth/Sex: [birth year] (57 y.o. Female) Treating RN: Renne Crigler Primary Care Provider: Katina Dung Other Clinician: Referring Provider: Katina Dung Treating Provider/Extender: STONE III, HOYT Weeks in Treatment: 2 Constitutional Obese and well-hydrated in no acute  distress. Respiratory normal breathing without difficulty. clear to auscultation bilaterally. Cardiovascular regular rate and rhythm with normal S1, S2. Psychiatric this patient is able  to make decisions and demonstrates good insight into disease process. Alert and Oriented x 3. pleasant and cooperative. Notes Patient's wounds for both close but blisters at this point in time in various stages of resolving. She does have her offloading Prevalon Boots which is good news or at least an equivalent. Electronic Signature(s) Signed: 02/26/2017 10:30:07 AM By: Lenda Kelp PA-C Entered By: Lenda Kelp on 02/25/2017 10:17:38 Brooke Palmer ([id number]) -------------------------------------------------------------------------------- Physician Orders Details Patient Name: Brooke Palmer Date of Service: 02/25/2017 9:15 AM Medical Record Number: [id number] Patient Account Number: [id number] Date of Birth/Sex: Jul 05, 1960 (57 y.o. Female) Treating RN: Renne Crigler Primary Care Provider: Katina Dung Other Clinician: Referring Provider: Katina Dung Treating Provider/Extender: Linwood Dibbles, HOYT Weeks in Treatment: 2 Verbal / Phone Orders: No Diagnosis Coding ICD-10 Coding Code Description L98.8 Other specified disorders of the skin and subcutaneous tissue R41.841 Cognitive communication deficit R13.13 Dysphagia, pharyngeal phase I82.409 Acute embolism and thrombosis of unspecified deep veins of unspecified lower extremity G40.909 Epilepsy, unspecified, not intractable, without status epilepticus Anesthetic (add to Medication List) o Topical Lidocaine 4% cream applied to wound bed prior to debridement (In Clinic Only). Skin Barriers/Peri-Wound Care o Other: - left heel- skin prep right heel - betadine Off-Loading o Other: - Prevalon cushioned boots bilateral with heel cups and gauze wrap Electronic Signature(s) Signed: 02/25/2017 4:39:42 PM By: Renne Crigler Signed:  02/26/2017 10:30:07 AM By: Lenda Kelp PA-C Entered By: Renne Crigler on 02/25/2017 10:00:15 Brooke Palmer ([id number]) -------------------------------------------------------------------------------- Problem List Details Patient Name: Brooke Palmer Date of Service: 02/25/2017 9:15 AM Medical Record Number: [id number] Patient Account Number: [id number] Date of Birth/Sex: [birth year] (57 y.o. Female) Treating RN: Renne Crigler Primary Care Provider: Katina Dung Other Clinician: Referring Provider: Katina Dung Treating Provider/Extender: Linwood Dibbles, HOYT Weeks in Treatment: 2 Active Problems ICD-10 Encounter Code Description Active Date Diagnosis L98.8 Other specified disorders of the skin and subcutaneous tissue 02/11/2017 Yes R41.841 Cognitive communication deficit 02/11/2017 Yes R13.13 Dysphagia, pharyngeal phase 02/11/2017 Yes I82.409 Acute embolism and thrombosis of unspecified deep veins of 02/11/2017 Yes unspecified lower extremity G40.909 Epilepsy, unspecified, not intractable, without status epilepticus 02/11/2017 Yes Inactive Problems Resolved Problems Electronic Signature(s) Signed: 02/26/2017 10:30:07 AM By: Lenda Kelp PA-C Entered By: Lenda Kelp on 02/25/2017 09:40:17 Brooke Palmer ([id number]) -------------------------------------------------------------------------------- Progress Note Details Patient Name: Brooke Palmer Date of Service: 02/25/2017 9:15 AM Medical Record Number: [id number] Patient Account Number: [id number] Date of Birth/Sex: 1961-01-19 (57 y.o. 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 The good news is the ulcerations on the left lower extremity appear to be doing well with one of the areas over the great toe region actually healed. In regard to the left second toe it appears this to may be healed although I'm not 100% confident in calling this so just based on this initial evaluation there does not appear to be any drainage  however. Subsequently there is also a left heel deep tissue injury which appears to be minimal and does not appear to hurt her at this point which is good news. Unfortunately secondary to her mental status she is unable to rate or describe any discomfort she may be having. She is a resident at a group home. However with the severity of her impairment including the need for a Hoyer lift in order to move her around I am wondering if she should not actually be a skilled patient. No fevers, chills, nausea, or vomiting noted at this time. Patient did previously have pneumonia as well as acute kidney failure when she was most recently in the hospital prior to discharge and then subsequent follow-up with [place]. 02/25/17 the good news is the wounds that I was previously evaluating patient forcing to be doing better and have healed. Unfortunately she has a blister on the left heel and what appears to be a blood blister on the right heel both immediately that are new neither appear to be open in both appear to be dry and intact for the time being. There is no evidence of infection at this point. Patient does not seem to have any significant pain  which is good news. Patient History Information obtained from Patient. Social History Never smoker, Marital Status - Single, Alcohol Use - Never, Drug Use - No History, Caffeine Use - Never. Medical And Surgical History Notes Respiratory recent hospitalization for aspiration pneumonia and kidney failure Cardiovascular cardiomegaly Neurologic Down Syndrome PTSD Review of Systems (ROS) Constitutional Symptoms (General Health) Denies complaints or symptoms of Fever, Chills. Respiratory The patient has no complaints or symptoms. DANICKA, HOURIHAN ([id number]) Cardiovascular The patient has no complaints or symptoms. Psychiatric The patient has no complaints or symptoms. Objective Constitutional Obese and well-hydrated in no acute distress. Vitals Time Taken: 9:22 AM, Temperature: 98.3 F, Pulse: 81 bpm, Respiratory Rate: 18 breaths/min, Blood Pressure: 143/95 mmHg. Respiratory normal breathing without difficulty. clear to auscultation bilaterally. Cardiovascular regular rate and rhythm with normal S1, S2. Psychiatric this patient is able to make decisions and demonstrates good insight into disease process. Alert and Oriented x 3. pleasant and cooperative. General Notes: Patient's wounds for both close but blisters at this point in time in various stages of resolving. She does have her offloading Prevalon Boots which is good news or at least an equivalent. Integumentary (Hair, Skin) Wound #2 status is Healed - Epithelialized. Original cause of wound was Pressure Injury. The wound is located on the Left Toe Second. The wound measures 0cm length x 0cm width x 0cm depth; 0cm^2 area and 0cm^3 volume. Other Condition(s) Patient presents with Suspected Deep Tissue Injury located on the Left heel. The skin appearance exhibited: Callus. The skin appearance did not exhibit: Atrophie Blanche, Crepitus, Cyanosis, Dry/Scaly, Ecchymosis, Erythema, Excoriation, Friable, Hemosiderin Staining,  Induration, Maceration, Mottled, Pallor, Rash, Rubor, Scarring. Skin temperature was noted as No Abnormality. General Notes: Blister area with intact skin Patient presents with Suspected Deep Tissue Injury located on the Right Foot. The skin appearance did not exhibit: Atrophie Blanche, Callus, Crepitus, Cyanosis, Dry/Scaly, Ecchymosis, Erythema, Excoriation, Friable, Hemosiderin Staining, Induration, Maceration, Mottled, Pallor, Rash, Rubor, Scarring. General Notes: blister intact on right heel with black area under skin Assessment Active Problems THOMAS, RHUDE ([id number]) ICD-10 L98.8 - Other specified disorders of the skin and subcutaneous tissue R41.841 - Cognitive communication deficit R13.13 - Dysphagia, pharyngeal phase I82.409 - Acute embolism and thrombosis of unspecified deep veins of unspecified lower extremity G40.909 -  Epilepsy, unspecified, not intractable, without status epilepticus Plan Anesthetic (add to Medication List): Topical Lidocaine 4% cream applied to wound bed prior to debridement (In Clinic Only). Skin Barriers/Peri-Wound Care: Other: - left heel- skin prep right heel - betadine Off-Loading: Other: - Prevalon cushioned boots bilateral with heel cups and gauze wrap I'm going to recommend that we continue with the Current wound care measures in regard to offloading and it was recommended as well due to the fact that her shields do seem to push on the wheelchair foot rest even with her boots on that she not be in the wheelchair often and that we need to make sure to completely offload her feet. This was discussed with the individual from the facility that came with her today. Subsequently we will continue with the current wound care measures in regard to the Betadine for the right heel and skin prep for the left heel for the next week. It was noted that patient may have something on her bottom that is trying to open. We did not have her the room where we could  evaluate this today. We are going to schedule her for one of the larger rooms next week to be able to put her on the stretcher and subsequently evaluate her sacral/gluteal area. Otherwise hopefully the heel wounds will be doing much better by that time. Please see above for specific wound care orders. We will see patient for re-evaluation in 1 week(s) here in the clinic. If anything worsens or changes patient will contact our office for additional recommendations. Electronic Signature(s) Signed: 03/01/2017 9:41:38 AM By: Elliot Gurney, BSN, RN, CWS, Kim RN, BSN Signed: 03/06/2017 7:57:29 AM By: Lenda Kelp PA-C Previous Signature: 02/26/2017 10:30:07 AM Version By: Lenda Kelp PA-C Entered By: Elliot Gurney BSN, RN, CWS, Kim on 03/01/2017 09:41:37 DIONDRA, PINES ([id number]) -------------------------------------------------------------------------------- ROS/PFSH Details Patient Name: Brooke Palmer Date of Service: 02/25/2017 9:15 AM Medical Record Number: [id number] Patient Account Number: [id number] Date of Birth/Sex: 23-Jul-1960 (57 y.o. Female) Treating RN: Renne Crigler Primary Care Provider: Katina Dung Other Clinician: Referring Provider: Katina Dung Treating Provider/Extender: STONE III, HOYT Weeks in Treatment: 2 Information Obtained From Patient Wound History Do you currently have one or more open woundso Yes How many open wounds do you currently haveo 1 Approximately how long have you had your woundso 1 .5 weeks Has your wound(s) ever healed and then re-openedo Yes Have you had any lab work done in the past montho Yes Who ordered the lab work doneo in hospital Have you tested positive for an antibiotic resistant organism (MRSA, VRE)o No Have you tested positive for osteomyelitis (bone infection)o No Have you had any tests for circulation on your legso No Constitutional Symptoms (General Health) Complaints and Symptoms: Negative for: Fever; Chills Respiratory Complaints  and Symptoms: No Complaints or Symptoms Medical History: Positive for: Aspiration - pneumonia resent hospitalization Negative for: Asthma; Chronic Obstructive Pulmonary Disease (COPD); Pneumothorax; Sleep Apnea; Tuberculosis Past Medical History Notes: recent hospitalization for aspiration pneumonia and kidney failure Cardiovascular Complaints and Symptoms: No Complaints or Symptoms Medical History: Positive for: Deep Vein Thrombosis; Hypotension Negative for: Angina; Arrhythmia; Congestive Heart Failure; Coronary Artery Disease; Hypertension; Myocardial Infarction; Peripheral Arterial Disease; Peripheral Venous Disease; Phlebitis; Vasculitis Past Medical History Notes: cardiomegaly Gastrointestinal Medical History: Negative for: Cirrhosis ; Colitis; Crohnos; Hepatitis A; Hepatitis B; Hepatitis C Endocrine SERINITY, WARE ([id number]) Medical History: Negative for: Type I Diabetes; Type II Diabetes Genitourinary Medical History: Positive for: End Stage Renal Disease - hospitalization for kidney  failure Immunological Medical History: Negative for: Lupus Erythematosus; Raynaudos; Scleroderma Integumentary (Skin) Medical History: Positive for: History of pressure wounds Negative for: History of Burn Musculoskeletal Medical History: Negative for: Gout; Rheumatoid Arthritis; Osteoarthritis Neurologic Medical History: Positive for: Dementia; Seizure Disorder Negative for: Neuropathy; Quadriplegia; Paraplegia Past Medical History Notes: Down Syndrome PTSD Psychiatric Complaints and Symptoms: No Complaints or Symptoms Immunizations Pneumococcal Vaccine: Received Pneumococcal Vaccination: Yes Implantable Devices Family and Social History Never smoker; Marital Status - Single; Alcohol Use: Never; Drug Use: No History; Caffeine Use: Never; Financial Concerns: No; Food, Clothing or Shelter Needs: No; Support System Lacking: No; Transportation Concerns: No; Advanced  Directives: No; Patient does not want information on Advanced Directives; Do not resuscitate: No; Living Will: No; Medical Power of Attorney: No Physician Affirmation I have reviewed and agree with the above information. Electronic Signature(s) Signed: 02/25/2017 4:39:42 PM By: Renne CriglerFlinchum, Cheryl Signed: 02/26/2017 10:30:07 AM By: Lenda KelpStone III, Hoyt PA-C Entered By: Lenda KelpStone III, Hoyt on 02/25/2017 10:15:57 Brooke AngelMARTIN, Haileigh ([id number]) Brooke AngelMARTIN, Katiria ([id number]) -------------------------------------------------------------------------------- SuperBill Details Patient Name: Brooke AngelMARTIN, Miku Date of Service: 02/25/2017 Medical Record Number: [id number] Patient Account Number: [id number] Date of Birth/Sex: 02/03/1961 (57 y.o. Female) Treating RN: Renne CriglerFlinchum, Cheryl Primary Care Provider: Katina DungOYALS, HOOVER Other Clinician: Referring Provider: Katina DungOYALS, HOOVER Treating Provider/Extender: Linwood DibblesSTONE III, HOYT Weeks in Treatment: 2 Diagnosis Coding ICD-10 Codes Code Description L98.8 Other specified disorders of the skin and subcutaneous tissue R41.841 Cognitive communication deficit R13.13 Dysphagia, pharyngeal phase I82.409 Acute embolism and thrombosis of unspecified deep veins of unspecified lower extremity G40.909 Epilepsy, unspecified, not intractable, without status epilepticus Facility Procedures CPT4 Code: [id number] Description: 99213 - WOUND CARE VISIT-LEV 3 EST PT Modifier: Quantity: 1 Physician Procedures CPT4 Code: [id number] Description: 99213 - WC PHYS LEVEL 3 - EST PT ICD-10 Diagnosis Description L98.8 Other specified disorders of the skin and subcutaneous t R41.841 Cognitive communication deficit R13.13 Dysphagia, pharyngeal phase Modifier: issue Quantity: 1 Electronic Signature(s) Signed: 02/26/2017 10:30:07 AM By: Lenda KelpStone III, Hoyt PA-C Entered By: Lenda KelpStone III, Hoyt on 02/25/2017 10:18:36

## 2017-02-26 NOTE — Progress Notes (Signed)
TIRZAH, FROSS ([id number]) Visit Report for 02/25/2017 Arrival Information Details Patient Name: Brooke Palmer, Brooke Palmer Date of Service: 02/25/2017 9:15 AM Medical Record Number: [id number] Patient Account Number: [id number] Date of Birth/Sex: 06/27/1960 (57 y.o. Female) Treating RN: Renne Crigler Primary Care Margree Gimbel: Katina Dung Other Clinician: Referring Akari Defelice: Katina Dung Treating Umaiza Matusik/Extender: Linwood Dibbles, HOYT Weeks in Treatment: 2 Visit Information History Since Last Visit All ordered tests and consults were completed: No Patient Arrived: Wheel Chair Added or deleted any medications: No Arrival Time: 09:18 Any new allergies or adverse reactions: No Accompanied By: caregiver Had a fall or experienced change in No Transfer Assistance: None activities of daily living that may affect Patient Identification Verified: Yes risk of falls: Secondary Verification Process Completed: Yes Signs or symptoms of abuse/neglect since last visito No Hospitalized since last visit: No Pain Present Now: No Electronic Signature(s) Signed: 02/25/2017 4:39:42 PM By: Renne Crigler Entered By: Renne Crigler on 02/25/2017 09:21:50 Brooke Palmer ([id number]) -------------------------------------------------------------------------------- Clinic Level of Care Assessment Details Patient Name: Brooke Palmer Date of Service: 02/25/2017 9:15 AM Medical Record Number: [id number] Patient Account Number: [id number] Date of Birth/Sex: 06-29-1960 (57 y.o. Female) Treating RN: Renne Crigler Primary Care Caulder Wehner: Katina Dung Other Clinician: Referring Gustavus Haskin: Katina Dung Treating Rosmery Duggin/Extender: Linwood Dibbles, HOYT Weeks in Treatment: 2 Clinic Level of Care Assessment Items TOOL 4 Quantity Score []  - Use when only an EandM is performed on FOLLOW-UP visit 0 ASSESSMENTS - Nursing Assessment / Reassessment X - Reassessment of Co-morbidities (includes updates in patient status) 1 10 X-  1 5 Reassessment of Adherence to Treatment Plan ASSESSMENTS - Wound and Skin Assessment / Reassessment []  - Simple Wound Assessment / Reassessment - one wound 0 X- 2 5 Complex Wound Assessment / Reassessment - multiple wounds []  - 0 Dermatologic / Skin Assessment (not related to wound area) ASSESSMENTS - Focused Assessment []  - Circumferential Edema Measurements - multi extremities 0 []  - 0 Nutritional Assessment / Counseling / Intervention []  - 0 Lower Extremity Assessment (monofilament, tuning fork, pulses) []  - 0 Peripheral Arterial Disease Assessment (using hand held doppler) ASSESSMENTS - Ostomy and/or Continence Assessment and Care []  - Incontinence Assessment and Management 0 []  - 0 Ostomy Care Assessment and Management (repouching, etc.) PROCESS - Coordination of Care X - Simple Patient / Family Education for ongoing care 1 15 []  - 0 Complex (extensive) Patient / Family Education for ongoing care []  - 0 Staff obtains Chiropractor, Records, Test Results / Process Orders []  - 0 Staff telephones HHA, Nursing Homes / Clarify orders / etc []  - 0 Routine Transfer to another Facility (non-emergent condition) []  - 0 Routine Hospital Admission (non-emergent condition) []  - 0 New Admissions / Manufacturing engineer / Ordering NPWT, Apligraf, etc. []  - 0 Emergency Hospital Admission (emergent condition) X- 1 10 Simple Discharge Coordination MAHLET, JERGENS ([id number]) []  - 0 Complex (extensive) Discharge Coordination PROCESS - Special Needs []  - Pediatric / Minor Patient Management 0 []  - 0 Isolation Patient Management []  - 0 Hearing / Language / Visual special needs []  - 0 Assessment of Community assistance (transportation, D/C planning, etc.) []  - 0 Additional assistance / Altered mentation []  - 0 Support Surface(s) Assessment (bed, cushion, seat, etc.) INTERVENTIONS - Wound Cleansing / Measurement []  - Simple Wound Cleansing - one wound 0 X- 2 5 Complex Wound  Cleansing - multiple wounds X- 1 5 Wound Imaging (photographs - any number of wounds) []  - 0 Wound Tracing (instead of photographs) []  - 0 Simple Wound Measurement - one wound X-  2 5 Complex Wound Measurement - multiple wounds INTERVENTIONS - Wound Dressings X - Small Wound Dressing one or multiple wounds 2 10 []  - 0 Medium Wound Dressing one or multiple wounds []  - 0 Large Wound Dressing one or multiple wounds []  - 0 Application of Medications - topical []  - 0 Application of Medications - injection INTERVENTIONS - Miscellaneous []  - External ear exam 0 []  - 0 Specimen Collection (cultures, biopsies, blood, body fluids, etc.) []  - 0 Specimen(s) / Culture(s) sent or taken to Lab for analysis []  - 0 Patient Transfer (multiple staff / Nurse, adultHoyer Lift / Similar devices) []  - 0 Simple Staple / Suture removal (25 or less) []  - 0 Complex Staple / Suture removal (26 or more) []  - 0 Hypo / Hyperglycemic Management (close monitor of Blood Glucose) []  - 0 Ankle / Brachial Index (ABI) - do not check if billed separately X- 1 5 Vital Signs Brooke Palmer, Brooke Palmer ([id number]) Has the patient been seen at the hospital within the last three years: Yes Total Score: 100 Level Of Care: New/Established - Level 3 Electronic Signature(s) Signed: 02/25/2017 4:39:42 PM By: Renne CriglerFlinchum, Cheryl Entered By: Renne CriglerFlinchum, Cheryl on 02/25/2017 09:57:57 Brooke AngelMARTIN, Brooke Palmer ([id number]) -------------------------------------------------------------------------------- Encounter Discharge Information Details Patient Name: Brooke AngelMARTIN, Brooke Palmer Date of Service: 02/25/2017 9:15 AM Medical Record Number: [id number] Patient Account Number: [id number] Date of Birth/Sex: 04/09/1960 (57 y.o. Female) Treating RN: Renne CriglerFlinchum, Cheryl Primary Care Tej Murdaugh: Katina DungOYALS, HOOVER Other Clinician: Referring Deasha Clendenin: Katina DungOYALS, HOOVER Treating Psalms Olarte/Extender: Linwood DibblesSTONE III, HOYT Weeks in Treatment: 2 Encounter Discharge Information Items Discharge Pain  Level: 0 Discharge Condition: Stable Ambulatory Status: Wheelchair Discharge Destination: Home Transportation: Private Auto Accompanied By: caregiver Schedule Follow-up Appointment: Yes Medication Reconciliation completed and No provided to Patient/Care Yazlynn Birkeland: Provided on Clinical Summary of Care: 02/25/2017 Form Type Recipient Paper Patient CM Electronic Signature(s) Signed: 02/25/2017 4:39:42 PM By: Renne CriglerFlinchum, Cheryl Entered By: Renne CriglerFlinchum, Cheryl on 02/25/2017 10:02:03 Brooke AngelMARTIN, Brooke Palmer ([id number]) -------------------------------------------------------------------------------- Lower Extremity Assessment Details Patient Name: Brooke AngelMARTIN, Brooke Palmer Date of Service: 02/25/2017 9:15 AM Medical Record Number: [id number] Patient Account Number: [id number] Date of Birth/Sex: 08/30/1960 (57 y.o. Female) Treating RN: Renne CriglerFlinchum, Cheryl Primary Care Adith Tejada: Katina DungOYALS, HOOVER Other Clinician: Referring Stevee Valenta: Katina DungOYALS, HOOVER Treating Armine Rizzolo/Extender: STONE III, HOYT Weeks in Treatment: 2 Edema Assessment Assessed: [Left: No] [Right: No] Edema: [Left: No] [Right: No] Vascular Assessment Claudication: Claudication Assessment [Left:None] Pulses: Dorsalis Pedis Palpable: [Left:Yes] [Right:Yes] Posterior Tibial Extremity colors, hair growth, and conditions: Extremity Color: [Left:Normal] [Right:Normal] Hair Growth on Extremity: [Left:No] [Right:No] Temperature of Extremity: [Left:Warm] [Right:Warm] Capillary Refill: [Left:< 3 seconds] [Right:< 3 seconds] Toe Nail Assessment Left: Right: Thick: No No Discolored: No No Deformed: No No Improper Length and Hygiene: No No Electronic Signature(s) Signed: 02/25/2017 4:39:42 PM By: Renne CriglerFlinchum, Cheryl Entered By: Renne CriglerFlinchum, Cheryl on 02/25/2017 09:56:25 Brooke AngelMARTIN, Brooke Palmer ([id number]) -------------------------------------------------------------------------------- Multi Wound Chart Details Patient Name: Brooke AngelMARTIN, Brooke Palmer Date of Service: 02/25/2017 9:15  AM Medical Record Number: [id number] Patient Account Number: [id number] Date of Birth/Sex: 01/01/1961 (57 y.o. Female) Treating RN: Renne CriglerFlinchum, Cheryl Primary Care Kareena Arrambide: Katina DungOYALS, HOOVER Other Clinician: Referring Kenji Mapel: Katina DungOYALS, HOOVER Treating Bernardino Dowell/Extender: STONE III, HOYT Weeks in Treatment: 2 Vital Signs Height(in): Pulse(bpm): 81 Weight(lbs): Blood Pressure(mmHg): 143/95 Body Mass Index(BMI): Temperature(F): 98.3 Respiratory Rate 18 (breaths/min): Photos: [N/A:N/A] Wound Location: Left Toe Second N/A N/A Wounding Event: Pressure Injury N/A N/A Primary Etiology: Pressure Ulcer N/A N/A Date Acquired: 02/04/2017 N/A N/A Weeks of Treatment: 2 N/A N/A Wound Status: Healed - Epithelialized N/A N/A Measurements L x W x D 0x0x0 N/A N/A (cm) Area (cm) :  0 N/A N/A Volume (cm) : 0 N/A N/A % Reduction in Area: 100.00% N/A N/A % Reduction in Volume: 100.00% N/A N/A Classification: Unstageable/Unclassified N/A N/A Periwound Skin Texture: No Abnormalities Noted N/A N/A Periwound Skin Moisture: No Abnormalities Noted N/A N/A Periwound Skin Color: No Abnormalities Noted N/A N/A Tenderness on Palpation: No N/A N/A Treatment Notes Electronic Signature(s) Signed: 02/25/2017 4:38:07 PM By: Renne Crigler Entered By: Renne Crigler on 02/25/2017 16:38:07 Brooke Palmer ([id number]) -------------------------------------------------------------------------------- Multi-Disciplinary Care Plan Details Patient Name: Brooke Palmer Date of Service: 02/25/2017 9:15 AM Medical Record Number: [id number] Patient Account Number: [id number] Date of Birth/Sex: January 26, 1961 (57 y.o. Female) Treating RN: Renne Crigler Primary Care Shawnetta Lein: Katina Dung Other Clinician: Referring Jillana Selph: Katina Dung Treating Kazimir Hartnett/Extender: Linwood Dibbles, HOYT Weeks in Treatment: 2 Active Inactive ` Orientation to the Wound Care Program Nursing Diagnoses: Knowledge deficit related to the  wound healing center program Goals: Patient/caregiver will verbalize understanding of the Wound Healing Center Program Date Initiated: 02/11/2017 Target Resolution Date: 03/14/2017 Goal Status: Active Interventions: Provide education on orientation to the wound center Notes: ` Wound/Skin Impairment Nursing Diagnoses: Impaired tissue integrity Knowledge deficit related to ulceration/compromised skin integrity Goals: Patient/caregiver will verbalize understanding of skin care regimen Date Initiated: 02/11/2017 Target Resolution Date: 03/14/2017 Goal Status: Active Ulcer/skin breakdown will have a volume reduction of 30% by week 4 Date Initiated: 02/11/2017 Target Resolution Date: 03/14/2017 Goal Status: Active Interventions: Assess patient/caregiver ability to perform ulcer/skin care regimen upon admission and as needed Assess ulceration(s) every visit Treatment Activities: Skin care regimen initiated : 02/11/2017 Notes: Electronic Signature(s) Signed: 02/25/2017 4:38:00 PM By: Ammie Ferrier, Eber Jones ([id number]) Entered By: Renne Crigler on 02/25/2017 16:38:00 Brooke Palmer ([id number]) -------------------------------------------------------------------------------- Non-Wound Condition Assessment Details Patient Name: Brooke Palmer Date of Service: 02/25/2017 9:15 AM Medical Record Number: [id number] Patient Account Number: [id number] Date of Birth/Sex: 1960/12/19 (57 y.o. Female) Treating RN: Renne Crigler Primary Care Arieona Swaggerty: Katina Dung Other Clinician: Referring Coley Kulikowski: Katina Dung Treating Kamalei Roeder/Extender: STONE III, HOYT Weeks in Treatment: 2 Non-Wound Condition: Condition: Suspected Deep Tissue Injury Location: Other: heel Side: Left Photos Periwound Skin Texture Texture Color No Abnormalities Noted: No No Abnormalities Noted: No Callus: Yes Atrophie Blanche: No Crepitus: No Cyanosis: No Excoriation: No Ecchymosis: No Friable:  No Erythema: No Induration: No Hemosiderin Staining: No Rash: No Mottled: No Scarring: No Pallor: No Rubor: No Moisture No Abnormalities Noted: No Temperature / Pain Dry / Scaly: No Temperature: No Abnormality Maceration: No Notes Blister area with intact skin Electronic Signature(s) Signed: 02/25/2017 10:48:53 AM By: Renne Crigler Entered By: Renne Crigler on 02/25/2017 10:48:53 Brooke Palmer ([id number]) -------------------------------------------------------------------------------- Non-Wound Condition Assessment Details Patient Name: Brooke Palmer Date of Service: 02/25/2017 9:15 AM Medical Record Number: [id number] Patient Account Number: [id number] Date of Birth/Sex: 29-Nov-1960 (57 y.o. Female) Treating RN: Renne Crigler Primary Care Trentan Trippe: Katina Dung Other Clinician: Referring Dirk Vanaman: Katina Dung Treating Dontarius Sheley/Extender: STONE III, HOYT Weeks in Treatment: 2 Non-Wound Condition: Condition: Suspected Deep Tissue Injury Location: Foot Side: Right Photos Periwound Skin Texture Texture Color No Abnormalities Noted: No No Abnormalities Noted: No Callus: No Atrophie Blanche: No Crepitus: No Cyanosis: No Excoriation: No Ecchymosis: No Friable: No Erythema: No Induration: No Hemosiderin Staining: No Rash: No Mottled: No Scarring: No Pallor: No Rubor: No Moisture No Abnormalities Noted: No Dry / Scaly: No Maceration: No Notes blister intact on right heel with black area under skin Electronic Signature(s) Signed: 02/25/2017 10:49:16 AM By: Renne Crigler Entered By: Renne Crigler on 02/25/2017 10:49:15 Brooke Palmer ([id number]) -------------------------------------------------------------------------------- Pain Assessment Details  Patient Name: Brooke Palmer, Brooke Palmer Date of Service: 02/25/2017 9:15 AM Medical Record Number: [id number] Patient Account Number: [id number] Date of Birth/Sex: [birth year] (57 y.o. Female) Treating RN:  Renne Crigler Primary Care Jackob Crookston: Katina Dung Other Clinician: Referring Waris Rodger: Katina Dung Treating Nahomy Limburg/Extender: Linwood Dibbles, HOYT Weeks in Treatment: 2 Active Problems Location of Pain Severity and Description of Pain Patient Has Paino No Site Locations Pain Management and Medication Current Pain Management: Electronic Signature(s) Signed: 02/25/2017 4:39:42 PM By: Renne Crigler Entered By: Renne Crigler on 02/25/2017 09:22:12 Brooke Palmer ([id number]) -------------------------------------------------------------------------------- Patient/Caregiver Education Details Patient Name: Brooke Palmer Date of Service: 02/25/2017 9:15 AM Medical Record Number: [id number] Patient Account Number: [id number] Date of Birth/Gender: 1960-06-01 (57 y.o. Female) Treating RN: Renne Crigler Primary Care Physician: Katina Dung Other Clinician: Referring Physician: Katina Dung Treating Physician/Extender: Skeet Simmer in Treatment: 2 Education Assessment Education Provided To: Patient Education Topics Provided Wound/Skin Impairment: Handouts: Caring for Your Ulcer Methods: Explain/Verbal Responses: State content correctly Electronic Signature(s) Signed: 02/25/2017 4:39:42 PM By: Renne Crigler Entered By: Renne Crigler on 02/25/2017 10:10:10 Brooke Palmer ([id number]) -------------------------------------------------------------------------------- Wound Assessment Details Patient Name: Brooke Palmer Date of Service: 02/25/2017 9:15 AM Medical Record Number: [id number] Patient Account Number: [id number] Date of Birth/Sex: Aug 30, 1960 (57 y.o. Female) Treating RN: Renne Crigler Primary Care Jorita Bohanon: Katina Dung Other Clinician: Referring Osiris Odriscoll: Katina Dung Treating Hiya Point/Extender: STONE III, HOYT Weeks in Treatment: 2 Wound Status Wound Number: 2 Primary Etiology: Pressure Ulcer Wound Location: Left Toe Second Wound Status:  Healed - Epithelialized Wounding Event: Pressure Injury Date Acquired: 02/04/2017 Weeks Of Treatment: 2 Clustered Wound: No Photos Photo Uploaded By: Renne Crigler on 02/25/2017 10:48:04 Wound Measurements Length: (cm) 0 Width: (cm) 0 Depth: (cm) 0 Area: (cm) 0 Volume: (cm) 0 % Reduction in Area: 100% % Reduction in Volume: 100% Wound Description Classification: Unstageable/Unclassified Periwound Skin Texture Texture Color No Abnormalities Noted: No No Abnormalities Noted: No Moisture No Abnormalities Noted: No Electronic Signature(s) Signed: 02/25/2017 4:39:42 PM By: Renne Crigler Entered By: Renne Crigler on 02/25/2017 09:28:08 Brooke Palmer ([id number]) -------------------------------------------------------------------------------- Vitals Details Patient Name: Brooke Palmer Date of Service: 02/25/2017 9:15 AM Medical Record Number: [id number] Patient Account Number: [id number] Date of Birth/Sex: 1960/12/01 (57 y.o. Female) Treating RN: Renne Crigler Primary Care Benton Tooker: Katina Dung Other Clinician: Referring Cheyrl Buley: Katina Dung Treating Elvenia Godden/Extender: STONE III, HOYT Weeks in Treatment: 2 Vital Signs Time Taken: 09:22 Temperature (F): 98.3 Pulse (bpm): 81 Respiratory Rate (breaths/min): 18 Blood Pressure (mmHg): 143/95 Reference Range: 80 - 120 mg / dl Electronic Signature(s) Signed: 02/25/2017 4:39:42 PM By: Renne Crigler Entered By: Renne Crigler on 02/25/2017 09:22:33

## 2017-03-04 ENCOUNTER — Ambulatory Visit: Payer: Medicare Other | Admitting: Physician Assistant

## 2017-03-07 ENCOUNTER — Emergency Department (HOSPITAL_COMMUNITY): Payer: Medicare Other

## 2017-03-07 ENCOUNTER — Encounter (HOSPITAL_COMMUNITY): Payer: Self-pay | Admitting: Internal Medicine

## 2017-03-07 ENCOUNTER — Other Ambulatory Visit: Payer: Self-pay

## 2017-03-07 ENCOUNTER — Inpatient Hospital Stay (HOSPITAL_COMMUNITY)
Admission: EM | Admit: 2017-03-07 | Discharge: 2017-03-11 | DRG: 871 | Disposition: A | Discharge status: Skilled Nursing Facility | Payer: Medicare Other | Attending: Internal Medicine | Admitting: Internal Medicine

## 2017-03-07 DIAGNOSIS — L8990 Pressure ulcer of unspecified site, unspecified stage: Secondary | ICD-10-CM

## 2017-03-07 DIAGNOSIS — J189 Pneumonia, unspecified organism: Secondary | ICD-10-CM | POA: Diagnosis present

## 2017-03-07 DIAGNOSIS — F0390 Unspecified dementia without behavioral disturbance: Secondary | ICD-10-CM | POA: Diagnosis present

## 2017-03-07 DIAGNOSIS — D649 Anemia, unspecified: Secondary | ICD-10-CM | POA: Diagnosis not present

## 2017-03-07 DIAGNOSIS — E870 Hyperosmolality and hypernatremia: Secondary | ICD-10-CM | POA: Diagnosis present

## 2017-03-07 DIAGNOSIS — D638 Anemia in other chronic diseases classified elsewhere: Secondary | ICD-10-CM | POA: Diagnosis present

## 2017-03-07 DIAGNOSIS — Z86718 Personal history of other venous thrombosis and embolism: Secondary | ICD-10-CM

## 2017-03-07 DIAGNOSIS — L89619 Pressure ulcer of right heel, unspecified stage: Secondary | ICD-10-CM | POA: Diagnosis present

## 2017-03-07 DIAGNOSIS — L89152 Pressure ulcer of sacral region, stage 2: Secondary | ICD-10-CM | POA: Diagnosis present

## 2017-03-07 DIAGNOSIS — G4700 Insomnia, unspecified: Secondary | ICD-10-CM | POA: Diagnosis present

## 2017-03-07 DIAGNOSIS — A419 Sepsis, unspecified organism: Principal | ICD-10-CM | POA: Diagnosis present

## 2017-03-07 DIAGNOSIS — Z79899 Other long term (current) drug therapy: Secondary | ICD-10-CM

## 2017-03-07 DIAGNOSIS — Z7901 Long term (current) use of anticoagulants: Secondary | ICD-10-CM

## 2017-03-07 DIAGNOSIS — L89629 Pressure ulcer of left heel, unspecified stage: Secondary | ICD-10-CM | POA: Diagnosis present

## 2017-03-07 DIAGNOSIS — L89153 Pressure ulcer of sacral region, stage 3: Secondary | ICD-10-CM | POA: Diagnosis not present

## 2017-03-07 DIAGNOSIS — E876 Hypokalemia: Secondary | ICD-10-CM | POA: Diagnosis present

## 2017-03-07 DIAGNOSIS — G40909 Epilepsy, unspecified, not intractable, without status epilepticus: Secondary | ICD-10-CM | POA: Diagnosis present

## 2017-03-07 DIAGNOSIS — R000 Tachycardia, unspecified: Secondary | ICD-10-CM

## 2017-03-07 DIAGNOSIS — F4310 Post-traumatic stress disorder, unspecified: Secondary | ICD-10-CM | POA: Diagnosis present

## 2017-03-07 DIAGNOSIS — R509 Fever, unspecified: Secondary | ICD-10-CM | POA: Diagnosis not present

## 2017-03-07 DIAGNOSIS — Q909 Down syndrome, unspecified: Secondary | ICD-10-CM | POA: Diagnosis not present

## 2017-03-07 DIAGNOSIS — E872 Acidosis: Secondary | ICD-10-CM | POA: Diagnosis present

## 2017-03-07 DIAGNOSIS — L89151 Pressure ulcer of sacral region, stage 1: Secondary | ICD-10-CM | POA: Diagnosis not present

## 2017-03-07 LAB — I-STAT CG4 LACTIC ACID, ED
LACTIC ACID, VENOUS: 2.15 mmol/L — AB (ref 0.5–1.9)
Lactic Acid, Venous: 2.6 mmol/L (ref 0.5–1.9)

## 2017-03-07 LAB — CBC WITH DIFFERENTIAL/PLATELET
BASOS PCT: 0 %
Basophils Absolute: 0 10*3/uL (ref 0.0–0.1)
EOS ABS: 0 10*3/uL (ref 0.0–0.7)
Eosinophils Relative: 0 %
HCT: 35 % — ABNORMAL LOW (ref 36.0–46.0)
HEMOGLOBIN: 11.5 g/dL — AB (ref 12.0–15.0)
Lymphocytes Relative: 9 %
Lymphs Abs: 1.3 10*3/uL (ref 0.7–4.0)
MCH: 33.7 pg (ref 26.0–34.0)
MCHC: 32.9 g/dL (ref 30.0–36.0)
MCV: 102.6 fL — ABNORMAL HIGH (ref 78.0–100.0)
MONO ABS: 0.4 10*3/uL (ref 0.1–1.0)
MONOS PCT: 2 %
NEUTROS PCT: 89 %
Neutro Abs: 13.2 10*3/uL — ABNORMAL HIGH (ref 1.7–7.7)
PLATELETS: 387 10*3/uL (ref 150–400)
RBC: 3.41 MIL/uL — ABNORMAL LOW (ref 3.87–5.11)
RDW: 15.2 % (ref 11.5–15.5)
WBC: 14.9 10*3/uL — ABNORMAL HIGH (ref 4.0–10.5)

## 2017-03-07 LAB — COMPREHENSIVE METABOLIC PANEL
ALK PHOS: 150 U/L — AB (ref 38–126)
ALT: 17 U/L (ref 14–54)
AST: 27 U/L (ref 15–41)
Albumin: 2.3 g/dL — ABNORMAL LOW (ref 3.5–5.0)
Anion gap: 11 (ref 5–15)
BUN: 15 mg/dL (ref 6–20)
CO2: 22 mmol/L (ref 22–32)
Calcium: 8.3 mg/dL — ABNORMAL LOW (ref 8.9–10.3)
Chloride: 114 mmol/L — ABNORMAL HIGH (ref 101–111)
Creatinine, Ser: 0.72 mg/dL (ref 0.44–1.00)
Glucose, Bld: 103 mg/dL — ABNORMAL HIGH (ref 65–99)
Potassium: 3.9 mmol/L (ref 3.5–5.1)
SODIUM: 147 mmol/L — AB (ref 135–145)
Total Bilirubin: 0.7 mg/dL (ref 0.3–1.2)
Total Protein: 7.2 g/dL (ref 6.5–8.1)

## 2017-03-07 LAB — URINALYSIS, ROUTINE W REFLEX MICROSCOPIC
BILIRUBIN URINE: NEGATIVE
Glucose, UA: NEGATIVE mg/dL
Hgb urine dipstick: NEGATIVE
KETONES UR: NEGATIVE mg/dL
Leukocytes, UA: NEGATIVE
NITRITE: NEGATIVE
PROTEIN: NEGATIVE mg/dL
Specific Gravity, Urine: 1.021 (ref 1.005–1.030)
pH: 6 (ref 5.0–8.0)

## 2017-03-07 LAB — INFLUENZA PANEL BY PCR (TYPE A & B)
Influenza A By PCR: NEGATIVE
Influenza B By PCR: NEGATIVE

## 2017-03-07 MED ORDER — PIPERACILLIN-TAZOBACTAM 3.375 G IVPB
3.3750 g | Freq: Three times a day (TID) | INTRAVENOUS | Status: DC
  Administered 2017-03-08 – 2017-03-11 (×11): 3.375 g via INTRAVENOUS
  Filled 2017-03-07 (×12): qty 50

## 2017-03-07 MED ORDER — PRO-STAT SUGAR FREE PO LIQD
30.0000 mL | Freq: Two times a day (BID) | ORAL | Status: DC
  Administered 2017-03-08 – 2017-03-11 (×5): 30 mL via ORAL
  Filled 2017-03-07 (×6): qty 30

## 2017-03-07 MED ORDER — PIPERACILLIN-TAZOBACTAM 3.375 G IVPB 30 MIN
3.3750 g | Freq: Once | INTRAVENOUS | Status: AC
  Administered 2017-03-07: 3.375 g via INTRAVENOUS
  Filled 2017-03-07: qty 50

## 2017-03-07 MED ORDER — ACETAMINOPHEN 650 MG RE SUPP: 650.0000 mg | Freq: Four times a day (QID) | RECTAL | Status: DC | PRN

## 2017-03-07 MED ORDER — LINEZOLID 600 MG PO TABS
600.0000 mg | ORAL_TABLET | Freq: Two times a day (BID) | ORAL | Status: DC
  Administered 2017-03-07 – 2017-03-09 (×4): 600 mg via ORAL
  Filled 2017-03-07 (×4): qty 1

## 2017-03-07 MED ORDER — SODIUM CHLORIDE 0.45 % IV SOLN
INTRAVENOUS | Status: AC
  Administered 2017-03-07: 21:00:00 via INTRAVENOUS

## 2017-03-07 MED ORDER — DEXAMETHASONE SODIUM PHOSPHATE 10 MG/ML IJ SOLN
10.0000 mg | Freq: Once | INTRAMUSCULAR | Status: AC
  Administered 2017-03-07: 10 mg via INTRAVENOUS
  Filled 2017-03-07: qty 1

## 2017-03-07 MED ORDER — DIPHENHYDRAMINE HCL 50 MG/ML IJ SOLN
12.5000 mg | Freq: Once | INTRAMUSCULAR | Status: AC
  Administered 2017-03-07: 12.5 mg via INTRAVENOUS
  Filled 2017-03-07: qty 1

## 2017-03-07 MED ORDER — LIDOCAINE HCL 1 % IJ SOLN
INTRAMUSCULAR | Status: AC
  Administered 2017-03-07: 20 mL
  Filled 2017-03-07: qty 20

## 2017-03-07 MED ORDER — LIDOCAINE HCL (PF) 1 % IJ SOLN: 2.0000 mL | Freq: Once | INTRAMUSCULAR | Status: DC

## 2017-03-07 MED ORDER — VANCOMYCIN HCL IN DEXTROSE 1-5 GM/200ML-% IV SOLN
1000.0000 mg | Freq: Once | INTRAVENOUS | Status: AC
  Administered 2017-03-07: 1000 mg via INTRAVENOUS
  Filled 2017-03-07: qty 200

## 2017-03-07 MED ORDER — ENOXAPARIN SODIUM 40 MG/0.4ML ~~LOC~~ SOLN
40.0000 mg | SUBCUTANEOUS | Status: DC
  Administered 2017-03-07: 40 mg via SUBCUTANEOUS
  Filled 2017-03-07: qty 0.4

## 2017-03-07 MED ORDER — ACETAMINOPHEN 500 MG PO TABS
1000.0000 mg | ORAL_TABLET | Freq: Once | ORAL | Status: AC
Start: 2017-03-07 — End: 2017-03-07
  Administered 2017-03-07: 1000 mg via ORAL
  Filled 2017-03-07: qty 2

## 2017-03-07 MED ORDER — ACETAMINOPHEN 325 MG PO TABS
650.0000 mg | ORAL_TABLET | Freq: Four times a day (QID) | ORAL | Status: DC | PRN
  Administered 2017-03-09 – 2017-03-11 (×6): 650 mg via ORAL
  Filled 2017-03-07 (×6): qty 2

## 2017-03-07 NOTE — H&P (Addendum)
TRH H&P   Patient Demographics:    Brooke Palmer, is a 57 y.o. female  MRN: [id number]   DOB - Jan 12, 1961  Admit Date - 03/07/2017  Outpatient Primary MD for the patient is [place], [place]  Referring MD/NP/PA:   Marca Ancona  Outpatient Specialists:   Patient coming from: [facility]  Chief Complaint  Patient presents with  . Fever  . Chills      HPI:    Brooke Palmer  is a 57 y.o. female,  w dementia, ptsd, seizure,  DVT,  Sacral decub apparently sent from group home to ER for fever of 105 . Pt is unable to provide any history due to dementia.     In ED,  Pt noted to be febrile and tachycardic and suspected to be septic.  Source of sepsis unclear.   Wbc 14.9, Hgb 11.5, Plt 387   Na 147 , K 3.9, Glucose 103,  Bun 15, Creatinne 0.72  Ast 27, Alt 17, Alk phos 150, T. Bili 0.7 Alb 2.3,  Influenza negative  CXR IMPRESSION: No acute cardiopulmonary abnormality compared to 02/02/2017; there is increased interstitial opacity at both lung bases, but this appears stable since December favoring chronic lung disease over acute infection.  Pt will be admitted for sepsis of unclear source ? Sacral decub.      Review of systems:    In addition to the HPI above,  Unable to obtain clearly from patient No Headache, No changes with Vision or hearing, No problems swallowing food or Liquids, No Chest pain, Cough or Shortness of Breath, No Abdominal pain, No Nausea or Vommitting, Bowel movements are regular, No Blood in stool or Urine, No dysuria, No new skin rashes or bruises, No new joints pains-aches,  No new weakness, tingling, numbness in any extremity, No recent weight gain or loss, No polyuria, polydypsia or polyphagia, No significant Mental Stressors.  A full 10 point Review of Systems was done, except as stated above, all other Review of Systems were  negative.   With Past History of the following :    Past Medical History:  Diagnosis Date  . Arthritis    osteo  . DVT (deep venous thrombosis) (HCC)    bilateral legs  . Dysphasia   . Insomnia   . PTSD (post-traumatic stress disorder)   . Seizures ([place])       History reviewed. No pertinent surgical history.    Social History:     Social History   Tobacco Use  . Smoking status: Never Smoker  . Smokeless tobacco: Never Used  Substance Use Topics  . Alcohol use: No    Frequency: Never     Lives - at group home  Mobility - unclear     Family History :     Family History  Family history unknown: Yes   Pt unable to give family history  Home Medications:   Prior to Admission medications   Medication Sig Start Date End Date Taking? Authorizing Provider  acetic acid (VOSOL) 2 % otic solution Place 4 drops into both ears 2 (two) times daily.   Yes [provider]  acetic acid-hydrocortisone (VOSOL-HC) OTIC solution Place 4 drops into both ears 3 (three) times daily as needed (ear infection prevention).    Yes [provider]  Calcium Carbonate-Vitamin D (OSCAL 500/200 D-3 PO) Take 1 tablet by mouth 2 (two) times daily.    Yes [provider]  carbamide peroxide (DEBROX) 6.5 % OTIC solution Place 5 drops into both ears 2 (two) times daily as needed (ear infection prevention).    Yes [provider]  Cholecalciferol (VITAMIN D3) 2000 units TABS Take 2,000 Units by mouth daily after breakfast.   Yes [provider]  coal tar (NEUTROGENA T-GEL) 0.5 % shampoo Apply 1 application topically at bedtime as needed (scalp, dandruff).    Yes [provider]  diazepam (DIASTAT ACUDIAL) 10 MG GEL Place 10 mg rectally daily as needed for seizure.   Yes [provider]  docusate sodium (COLACE) 100 MG capsule Take 100 mg by mouth daily.   Yes [provider]  fludrocortisone (FLORINEF) 0.1 MG tablet Take 0.1  mg by mouth daily. 12/12/16  Yes [provider]  ibuprofen (ADVIL,MOTRIN) 800 MG tablet Take 800 mg by mouth every 6 (six) hours as needed for moderate pain.   Yes [provider]  lamoTRIgine (LAMICTAL) 100 MG tablet Take 100 mg by mouth daily. 12/12/16  Yes [provider]  levETIRAcetam (KEPPRA) 500 MG tablet Take 1,250 mg by mouth 2 (two) times daily.   Yes [provider]  levothyroxine (SYNTHROID, LEVOTHROID) 100 MCG tablet Take 100 mcg by mouth daily. 12/12/16  Yes [provider]  Lidocaine-Glycerin (PREPARATION H) 5-14.4 % CREA Place 1 application rectally daily as needed (hemorrhoids).    Yes [provider]  LORazepam (ATIVAN) 0.5 MG tablet Take 0.5 mg by mouth 2 (two) times daily as needed for seizure.   Yes [provider]  Melatonin 3 MG TABS Take 3 mg by mouth at bedtime.    Yes [provider]  memantine (NAMENDA) 5 MG tablet Take 5 mg by mouth daily.  12/12/16  Yes [provider]  Menthol-Zinc Oxide (RISAMINE) 0.44-20.625 % OINT Apply 1 application topically 2 (two) times daily.   Yes [provider]  montelukast (SINGULAIR) 10 MG tablet Take 10 mg by mouth daily. 12/12/16  Yes [provider]  omeprazole (PRILOSEC) 20 MG capsule Take 20 mg by mouth daily. 12/12/16  Yes [provider]  phenylephrine-shark liver oil-mineral oil-petrolatum (PREPARATION H) 0.25-3-14-71.9 % rectal ointment Place 1 application rectally 2 (two) times daily as needed for hemorrhoids.   Yes [provider]  rivaroxaban (XARELTO) 20 MG TABS tablet Take 20 mg by mouth daily with supper.   Yes [provider]  erythromycin ophthalmic ointment Place a 1/2 inch ribbon of ointment into the lower eyelid QID for one week. Patient not taking: Reported on 02/18/2017 01/27/17   Brooke Morgan, MD  levETIRAcetam (KEPPRA) 1000 MG tablet Take 1 tablet (1,000 mg total) by mouth 2 (two) times  daily. Patient not taking: Reported on 02/18/2017 01/27/17   Brooke Morgan, MD  levETIRAcetam (KEPPRA) 250 MG tablet  02/26/17   [provider]  levofloxacin (LEVAQUIN) 750 MG tablet Take 1 tablet (750 mg total) by mouth daily. Patient not taking:  Reported on 02/18/2017 02/08/17   Marene Lenz, MD  potassium chloride SA (K-DUR,KLOR-CON) 20 MEQ tablet Take 1 tablet (20 mEq total) by mouth daily. Patient not taking: Reported on 02/18/2017 02/08/17   Marene Lenz, MD     Allergies:     Allergies  Allergen Reactions  . Vancomycin Hives     Physical Exam:   Vitals  Blood pressure (!) 112/54, pulse 96, temperature 99.8 F (37.7 C), temperature source Axillary, resp. rate 20, height 5' 3"  (1.6 m), weight 61.9 kg (136 lb 7.4 oz), SpO2 93 %.   1. General  lying in bed in NAD,    2.  Awake Alert, Oriented X 0  3. No F.N deficits, ALL C.Nerves Intact, Strength 5/5 all 4 extremities, Sensation intact all 4 extremities, Plantars down going.  4. Ears and Eyes appear Normal, Conjunctivae clear, PERRLA. Moist Oral Mucosa.  5. Supple Neck, No JVD, No cervical lymphadenopathy appriciated, No Carotid Bruits.  6. Symmetrical Chest wall movement, Good air movement bilaterally, CTAB.  7. RRR, No Gallops, Rubs or Murmurs, No Parasternal Heave.  8. Positive Bowel Sounds, Abdomen Soft, No tenderness, No organomegaly appriciated,No rebound -guarding or rigidity.  9.  No Cyanosis, skin ulcer on bilateral heels and also large sacral decub   10. Good muscle tone,  joints appear normal , no effusions, Normal ROM.  11. No Palpable Lymph Nodes in Neck or Axillae   Data Review:    CBC Recent Labs  Lab 03/07/17 1708  WBC 14.9*  HGB 11.5*  HCT 35.0*  PLT 387  MCV 102.6*  MCH 33.7  MCHC 32.9  RDW 15.2  LYMPHSABS 1.3  MONOABS 0.4  EOSABS 0.0  BASOSABS 0.0    ------------------------------------------------------------------------------------------------------------------  Chemistries  Recent Labs  Lab 03/07/17 1708  NA 147*  K 3.9  CL 114*  CO2 22  GLUCOSE 103*  BUN 15  CREATININE 0.72  CALCIUM 8.3*  AST 27  ALT 17  ALKPHOS 150*  BILITOT 0.7   ------------------------------------------------------------------------------------------------------------------ estimated creatinine clearance is 64.2 mL/min (by C-G formula based on SCr of 0.72 mg/dL). ------------------------------------------------------------------------------------------------------------------ No results for input(s): TSH, T4TOTAL, T3FREE, THYROIDAB in the last 72 hours.  Invalid input(s): FREET3  Coagulation profile No results for input(s): INR, PROTIME in the last 168 hours. ------------------------------------------------------------------------------------------------------------------- No results for input(s): DDIMER in the last 72 hours. -------------------------------------------------------------------------------------------------------------------  Cardiac Enzymes No results for input(s): CKMB, TROPONINI, MYOGLOBIN in the last 168 hours.  Invalid input(s): CK ------------------------------------------------------------------------------------------------------------------    Component Value Date/Time   BNP 89.7 02/02/2017 2131     ---------------------------------------------------------------------------------------------------------------  Urinalysis    Component Value Date/Time   COLORURINE YELLOW 03/07/2017 1708   APPEARANCEUR CLEAR 03/07/2017 1708   LABSPEC 1.021 03/07/2017 1708   PHURINE 6.0 03/07/2017 1708   GLUCOSEU NEGATIVE 03/07/2017 1708   HGBUR NEGATIVE 03/07/2017 1708   BILIRUBINUR NEGATIVE 03/07/2017 1708   KETONESUR NEGATIVE 03/07/2017 1708   PROTEINUR NEGATIVE 03/07/2017 1708   NITRITE NEGATIVE 03/07/2017 1708    LEUKOCYTESUR NEGATIVE 03/07/2017 1708    ----------------------------------------------------------------------------------------------------------------   Imaging Results:    Dg Chest 2 View  Result Date: 03/07/2017 CLINICAL DATA:  57 year old female with altered mental status and fever. EXAM: CHEST  2 VIEW COMPARISON:  02/18/2017 chest radiographs and earlier. FINDINGS: Similar to a study on 02/02/2017 the patient's arms are not raised on the lateral view, limiting its utility. Stable lung volumes since that time. Mediastinal contours are stable and within normal limits. No pneumothorax, pulmonary edema, pleural effusion or  consolidation. Mild nonspecific coarse and streaky opacity at both lung bases is noted on the frontal view, but appears stable since 02/02/2017. negative visible bowel gas pattern. No acute osseous abnormality identified. IMPRESSION: No acute cardiopulmonary abnormality compared to 02/02/2017; there is increased interstitial opacity at both lung bases, but this appears stable since December favoring chronic lung disease over acute infection. Electronically Signed   By: Genevie Ann M.D.   On: 03/07/2017 16:39      Assessment & Plan:    Principal Problem:   Sepsis ([place]) Active Problems:   Fever, unspecified   Tachycardia   Hypernatremia   Anemia   Fever/ sepsis (tachycardia, elevated LA, leukocytosis) Blood culture x2 resp viral panel Influenza  Check ESR vanco iv , zosyn iv pharmacy to dose  Tachycardia Tele Trop I q6h x3 Check echo  Hypernatremia Hydrate with 1/2 ns iv Check cmp in am  Anemia Check cbc in am  Sacral decub Wound care consult  Heel ulcers Heel protectors  H/o DVT Cont xarelto pharmacy to dose  Seizure do Cont keppra    DVT Prophylaxis Xarelto  AM Labs Ordered, also please review Full Orders  Family Communication: Admission, patients condition and plan of care including tests being ordered have been discussed with the  patient  who indicate understanding and agree with the plan and Code Status.  Code Status FULL CODE  Likely DC to  home  Condition GUARDED   Consults called: none  Admission status: inpatient   Time spent in minutes : 45   Jani Gravel M.D on 03/07/2017 at 11:35 PM  Between 7am to 7pm - Pager - [phone]. After 7pm go to [email or web address] - password [facility]  [facility] - Office  [phone]

## 2017-03-07 NOTE — Progress Notes (Signed)
A consult was received from an ED physician for vancomycin and zosyn per pharmacy dosing.  The patient's profile has been reviewed for ht/wt/allergies/indication/available labs.    A one time order has been placed for vancomycin 1000 mg and zosyn 3.375 gm IV x1 over 30 min.  Further antibiotics/pharmacy consults should be ordered by admitting physician if indicated.                       Thank you, Lucia Gaskinsham, Faryal Marxen P 03/07/2017  4:22 PM

## 2017-03-07 NOTE — ED Notes (Signed)
Upon reassessment of pt, hives were noted running up the right arm with increased swelling. Pt unable to vocalize whether there was itching.  Administration of vancomycin stopped at this time and MD made aware.

## 2017-03-07 NOTE — Progress Notes (Signed)
Pharmacy Antibiotic Note  Brooke Palmer is a 57 y.o. female presented to the ED from group home on 03/07/2017 with c/o fever.  Vancomycin and zosyn ordered in the ED for suspected sepsis. She developed hives with vancomycin dose given in the ED.  ID changed vancomycin to linezolid.  To continue with zosyn inpatient.  - scr 0.72 (crcl~78)   Plan: - zosyn 3.375 gm IV q8h (infuse over 4 hrs). With good renal function, pharmacy will sign off for zosyn.  - continue linezolid 600 mg PO bid per MD  ______________________________________  Height: 5\' 2"  (157.5 cm) Weight: 186 lb (84.4 kg) IBW/kg (Calculated) : 50.1  Temp (24hrs), Avg:101.2 F (38.4 C), Min:99 F (37.2 C), Max:103.3 F (39.6 C)  Recent Labs  Lab 03/07/17 1648 03/07/17 1708 03/07/17 1824  WBC  --  14.9*  --   CREATININE  --  0.72  --   LATICACIDVEN 2.60*  --  2.15*    Estimated Creatinine Clearance: 78.1 mL/min (by C-G formula based on SCr of 0.72 mg/dL).    No Known Allergies   Thank you for allowing pharmacy to be a part of this patient's care.  Lucia Gaskinsham, Ocia Simek P 03/07/2017 6:50 PM

## 2017-03-07 NOTE — ED Notes (Signed)
RN AND MD NOTIFIED OF PATIENT'S LACTIC ACID  LEVEL OF 2.15 

## 2017-03-07 NOTE — ED Notes (Signed)
ED TO INPATIENT HANDOFF REPORT  Name/Age/Gender Brooke Palmer 57 y.o. female  Code Status    Code Status Orders  (From admission, onward)        Start     Ordered   03/07/17 1843  Full code  Continuous     03/07/17 1846    Code Status History    Date Active Date Inactive Code Status Order ID Comments User Context   02/03/2017 02:31 02/07/2017 19:12 Full Code [id number]  Elwyn Reach, MD Inpatient      Home/SNF/Other [place]  Chief Complaint fever / chills  Level of Care/Admitting Diagnosis ED Disposition    ED Disposition Condition Comment   Admit  Hospital Area: [facility] [[id number]]  Level of Care: Telemetry [5]  Admit to tele based on following criteria: Monitor for Ischemic changes  Diagnosis: Sepsis [facility]) [[id number]]  Admitting Physician: Jani Gravel [[id number]]  Attending Physician: Jani Gravel [phone]  Estimated length of stay: past midnight tomorrow  Certification:: I certify this patient will need inpatient services for at least 2 midnights  PT Class (Do Not Modify): Inpatient [101]  PT Acc Code (Do Not Modify): Private [1]       Medical History Past Medical History:  Diagnosis Date  . Arthritis    osteo  . DVT (deep venous thrombosis) (HCC)    bilateral legs  . Dysphasia   . Insomnia   . PTSD (post-traumatic stress disorder)   . Seizures (HCC)     Allergies Allergies  Allergen Reactions  . Vancomycin Hives    IV Location/Drains/Wounds Patient Lines/Drains/Airways Status   Active Line/Drains/Airways    Name:   Placement date:   Placement time:   Site:   Days:   Peripheral IV 03/07/17 Left External jugular   03/07/17    1646    External jugular   less than 1   Peripheral IV 03/07/17 Right Wrist   03/07/17    1720    Wrist   less than 1   Pressure Injury 02/04/17 Stage II -  Partial thickness loss of dermis presenting as a shallow open ulcer with a red, pink wound bed without slough.   02/04/17    1000     31           Labs/Imaging Results for orders placed or performed during the hospital encounter of 03/07/17 (from the past 48 hour(s))  I-Stat CG4 Lactic Acid, ED     Status: Abnormal   Collection Time: 03/07/17  4:48 PM  Result Value Ref Range   Lactic Acid, Venous 2.60 (HH) 0.5 - 1.9 mmol/L   Comment NOTIFIED PHYSICIAN   Comprehensive metabolic panel     Status: Abnormal   Collection Time: 03/07/17  5:08 PM  Result Value Ref Range   Sodium 147 (H) 135 - 145 mmol/L   Potassium 3.9 3.5 - 5.1 mmol/L   Chloride 114 (H) 101 - 111 mmol/L   CO2 22 22 - 32 mmol/L   Glucose, Bld 103 (H) 65 - 99 mg/dL   BUN 15 6 - 20 mg/dL   Creatinine, Ser 0.72 0.44 - 1.00 mg/dL   Calcium 8.3 (L) 8.9 - 10.3 mg/dL   Total Protein 7.2 6.5 - 8.1 g/dL   Albumin 2.3 (L) 3.5 - 5.0 g/dL   AST 27 15 - 41 U/L   ALT 17 14 - 54 U/L   Alkaline Phosphatase 150 (H) 38 - 126 U/L   Total Bilirubin 0.7 0.3 - 1.2  mg/dL   GFR calc non Af Amer >60 >60 mL/min   GFR calc Af Amer >60 >60 mL/min    Comment: (NOTE) The eGFR has been calculated using the CKD EPI equation. This calculation has not been validated in all clinical situations. eGFR's persistently <60 mL/min signify possible Chronic Kidney Disease.    Anion gap 11 5 - 15  CBC with Differential     Status: Abnormal   Collection Time: 03/07/17  5:08 PM  Result Value Ref Range   WBC 14.9 (H) 4.0 - 10.5 K/uL   RBC 3.41 (L) 3.87 - 5.11 MIL/uL   Hemoglobin 11.5 (L) 12.0 - 15.0 g/dL   HCT 35.0 (L) 36.0 - 46.0 %   MCV 102.6 (H) 78.0 - 100.0 fL   MCH 33.7 26.0 - 34.0 pg   MCHC 32.9 30.0 - 36.0 g/dL   RDW 15.2 11.5 - 15.5 %   Platelets 387 150 - 400 K/uL   Neutrophils Relative % 89 %   Neutro Abs 13.2 (H) 1.7 - 7.7 K/uL   Lymphocytes Relative 9 %   Lymphs Abs 1.3 0.7 - 4.0 K/uL   Monocytes Relative 2 %   Monocytes Absolute 0.4 0.1 - 1.0 K/uL   Eosinophils Relative 0 %   Eosinophils Absolute 0.0 0.0 - 0.7 K/uL   Basophils Relative 0 %   Basophils Absolute 0.0 0.0 - 0.1  K/uL  Urinalysis, Routine w reflex microscopic     Status: None   Collection Time: 03/07/17  5:08 PM  Result Value Ref Range   Color, Urine YELLOW YELLOW   APPearance CLEAR CLEAR   Specific Gravity, Urine 1.021 1.005 - 1.030   pH 6.0 5.0 - 8.0   Glucose, UA NEGATIVE NEGATIVE mg/dL   Hgb urine dipstick NEGATIVE NEGATIVE   Bilirubin Urine NEGATIVE NEGATIVE   Ketones, ur NEGATIVE NEGATIVE mg/dL   Protein, ur NEGATIVE NEGATIVE mg/dL   Nitrite NEGATIVE NEGATIVE   Leukocytes, UA NEGATIVE NEGATIVE  Influenza panel by PCR (type A & B)     Status: None   Collection Time: 03/07/17  5:08 PM  Result Value Ref Range   Influenza A By PCR NEGATIVE NEGATIVE   Influenza B By PCR NEGATIVE NEGATIVE    Comment: (NOTE) The Xpert Xpress Flu assay is intended as an aid in the diagnosis of  influenza and should not be used as a sole basis for treatment.  This  assay is FDA approved for nasopharyngeal swab specimens only. Nasal  washings and aspirates are unacceptable for Xpert Xpress Flu testing.   I-Stat CG4 Lactic Acid, ED  (not at  [facility])     Status: Abnormal   Collection Time: 03/07/17  6:24 PM  Result Value Ref Range   Lactic Acid, Venous 2.15 (HH) 0.5 - 1.9 mmol/L   Comment NOTIFIED PHYSICIAN    Dg Chest 2 View  Result Date: 03/07/2017 CLINICAL DATA:  57 year old female with altered mental status and fever. EXAM: CHEST  2 VIEW COMPARISON:  02/18/2017 chest radiographs and earlier. FINDINGS: Similar to a study on 02/02/2017 the patient's arms are not raised on the lateral view, limiting its utility. Stable lung volumes since that time. Mediastinal contours are stable and within normal limits. No pneumothorax, pulmonary edema, pleural effusion or consolidation. Mild nonspecific coarse and streaky opacity at both lung bases is noted on the frontal view, but appears stable since 02/02/2017. negative visible bowel gas pattern. No acute osseous abnormality identified. IMPRESSION: No acute cardiopulmonary  abnormality compared to  02/02/2017; there is increased interstitial opacity at both lung bases, but this appears stable since December favoring chronic lung disease over acute infection. Electronically Signed   By: Genevie Ann M.D.   On: 03/07/2017 16:39    Pending Labs Unresulted Labs (From admission, onward)   Start     Ordered   03/14/17 0500  Creatinine, serum  (enoxaparin (LOVENOX)    CrCl >/= 30 ml/min)  Weekly,   R    Comments:  while on enoxaparin therapy    03/07/17 1846   03/08/17 0500  Comprehensive metabolic panel  Tomorrow morning,   R     03/07/17 1846   03/08/17 0500  CBC  Tomorrow morning,   R     03/07/17 1846   03/07/17 1612  Blood Culture (routine x 2)  BLOOD CULTURE X 2,   STAT     03/07/17 1612      Vitals/Pain Today's Vitals   03/07/17 1543 03/07/17 1610 03/07/17 1618 03/07/17 1745  BP:   138/62 (!) 142/69  Pulse:    (!) 106  Resp:    16  Temp:  (!) 103.3 F (39.6 C)    TempSrc:  Rectal    SpO2:    100%  Weight: 186 lb (84.4 kg)     Height: 5' 2"  (1.575 m)       Isolation Precautions No active isolations  Medications Medications  lidocaine (PF) (XYLOCAINE) 1 % injection 2 mL ( Intradermal Canceled Entry 03/07/17 1720)  linezolid (ZYVOX) tablet 600 mg (600 mg Oral Given 03/07/17 1911)  enoxaparin (LOVENOX) injection 40 mg (not administered)  acetaminophen (TYLENOL) tablet 650 mg (not administered)    Or  acetaminophen (TYLENOL) suppository 650 mg (not administered)  feeding supplement (PRO-STAT SUGAR FREE 64) liquid 30 mL (not administered)  0.45 % sodium chloride infusion (not administered)  piperacillin-tazobactam (ZOSYN) IVPB 3.375 g (not administered)  piperacillin-tazobactam (ZOSYN) IVPB 3.375 g (0 g Intravenous Stopped 03/07/17 1805)  vancomycin (VANCOCIN) IVPB 1000 mg/200 mL premix (0 mg Intravenous Stopped 03/07/17 1742)  acetaminophen (TYLENOL) tablet 1,000 mg (1,000 mg Oral Given 03/07/17 1728)  lidocaine (XYLOCAINE) 1 % (with pres) injection  (20 mLs  Given by Other 03/07/17 1719)  diphenhydrAMINE (BENADRYL) injection 12.5 mg (12.5 mg Intravenous Given 03/07/17 1745)  dexamethasone (DECADRON) injection 10 mg (10 mg Intravenous Given 03/07/17 1815)    Mobility non-ambulatory

## 2017-03-07 NOTE — ED Provider Notes (Signed)
[facility]-EMERGENCY DEPT Provider Note   CSN: [id number] Arrival date & time: 03/07/17  1525     History   Chief Complaint Chief Complaint  Patient presents with  . Faver  . Chills    HPI Brooke Palmer is a 57 y.o. female.  HPI 5 caveat dementia history is obtained by telephone from Ms.English,RN, nurse where patient resides and from Ms. AnitaBelusz worker at group home who accompanies patient and from records accompany patient..  Patient had fever of 10 5 noted approximately 3 PM today.  She was less active than normal.  No other associated symptoms.  She was treated with cool compresses prior to coming here. Past Medical History:  Diagnosis Date  . Arthritis    osteo  . DVT (deep venous thrombosis) (HCC)    bilateral legs  . Dysphasia   . Insomnia   . PTSD (post-traumatic stress disorder)   . Seizures [facility])     Patient Active Problem List   Diagnosis Date Noted  . ARF (acute renal failure) (HCC) 02/02/2017  . Dehydration 02/02/2017  . Leucocytosis 02/02/2017  . Seizure disorder (HCC) 02/02/2017  . Hypothyroid 02/02/2017  . GERD (gastroesophageal reflux disease) 02/02/2017  . DVT (deep venous thrombosis) (HCC) 02/02/2017    No past surgical history on file.  OB History    No data available       Home Medications    Prior to Admission medications   Medication Sig Start Date End Date Taking? Authorizing Provider  acetic acid (VOSOL) 2 % otic solution Place 4 drops into both ears 2 (two) times daily.    [provider]  acetic acid-hydrocortisone (VOSOL-HC) OTIC solution Place 4 drops into both ears 3 (three) times daily as needed (ear infection prevention).     [provider]  Calcium Carbonate-Vitamin D (OSCAL 500/200 D-3 PO) Take 1 tablet by mouth 2 (two) times daily.     [provider]  carbamide peroxide (DEBROX) 6.5 % OTIC solution Place 5 drops into both ears 2 (two) times daily as needed (ear  infection prevention).     [provider]  Cholecalciferol (VITAMIN D3) 2000 units TABS Take 2,000 Units by mouth daily after breakfast.    [provider]  coal tar (NEUTROGENA T-GEL) 0.5 % shampoo Apply 1 application topically at bedtime as needed (scalp, dandruff).     [provider]  diazepam (DIASTAT ACUDIAL) 10 MG GEL Place 10 mg rectally daily as needed for seizure.    [provider]  docusate sodium (COLACE) 100 MG capsule Take 100 mg by mouth daily.    [provider]  doxycycline (VIBRA-TABS) 100 MG tablet  01/29/17   [provider]  erythromycin ophthalmic ointment Place a 1/2 inch ribbon of ointment into the lower eyelid QID for one week. Patient not taking: Reported on 02/18/2017 01/27/17   Alvira Monday, MD  fludrocortisone (FLORINEF) 0.1 MG tablet Take 0.1 mg by mouth daily. 12/12/16   [provider]  ibuprofen (ADVIL,MOTRIN) 800 MG tablet Take 800 mg by mouth every 6 (six) hours as needed for moderate pain.    [provider]  lamoTRIgine (LAMICTAL) 100 MG tablet Take 100 mg by mouth daily. 12/12/16   [provider]  levETIRAcetam (KEPPRA) 1000 MG tablet Take 1 tablet (1,000 mg total) by mouth 2 (two) times daily. Patient not taking: Reported on 02/18/2017 01/27/17   Alvira Monday, MD  levETIRAcetam (KEPPRA) 750 MG tablet Take 750 mg by mouth  2 (two) times daily.  01/30/17   [provider]  levofloxacin (LEVAQUIN) 750 MG tablet Take 1 tablet (750 mg total) by mouth daily. Patient not taking: Reported on 02/18/2017 02/08/17   Meredith Leeds, MD  levothyroxine (SYNTHROID, LEVOTHROID) 100 MCG tablet Take 100 mcg by mouth daily. 12/12/16   [provider]  Lidocaine-Glycerin (PREPARATION H) 5-14.4 % CREA Place 1 application rectally daily as needed (hemorrhoids).     [provider]  LORazepam (ATIVAN) 0.5 MG tablet Take 0.5 mg by mouth 2 (two) times daily as needed for  seizure.    [provider]  Melatonin 3 MG TABS Take 3 mg by mouth at bedtime.     [provider]  memantine (NAMENDA) 5 MG tablet Take 5 mg by mouth 2 (two) times daily. 12/12/16   [provider]  Menthol-Zinc Oxide (RISAMINE) 0.44-20.625 % OINT Apply 1 application topically 2 (two) times daily.    [provider]  metroNIDAZOLE (METROCREAM) 0.75 % cream Apply 1 application topically 2 (two) times daily. 12/03/16   [provider]  montelukast (SINGULAIR) 10 MG tablet Take 10 mg by mouth daily. 12/12/16   [provider]  montelukast (SINGULAIR) 10 MG tablet Take by mouth.    [provider]  nabumetone (RELAFEN) 500 MG tablet  01/05/17   [provider]  omeprazole (PRILOSEC) 20 MG capsule Take 20 mg by mouth daily. 12/12/16   [provider]  phenylephrine-shark liver oil-mineral oil-petrolatum (PREPARATION H) 0.25-3-14-71.9 % rectal ointment Place 1 application rectally 2 (two) times daily as needed for hemorrhoids.    [provider]  potassium chloride SA (K-DUR,KLOR-CON) 20 MEQ tablet Take 1 tablet (20 mEq total) by mouth daily. Patient not taking: Reported on 02/18/2017 02/08/17   Meredith Leeds, MD  rivaroxaban (XARELTO) 20 MG TABS tablet Take 20 mg by mouth daily with supper.    [provider]  sertraline (ZOLOFT) 25 MG tablet Take 25 mg by mouth at bedtime.  12/12/16   [provider]  XARELTO 15 MG TABS tablet  01/08/17   [provider]  Past medical history hypothyroidism obesity posttraumatic stress disorder seborrheic dermatitis acne rosacea Down syndrome dementia insomnia seizure disorder cardiomegaly dysphagia idiopathic orthostatic hypotension osteoarthritis and dyspnea  Family History No family history on file.  Social History Social History   Tobacco Use  . Smoking status: Never Smoker  . Smokeless tobacco: Never Used  Substance Use Topics  .  Alcohol use: No    Frequency: Never  . Drug use: No     Allergies   Patient has no known allergies.   Review of Systems Review of Systems  Unable to perform ROS: Dementia  Constitutional: Positive for fever.  Musculoskeletal:       Bedbound     Physical Exam Updated Vital Signs BP (!) 130/119 (BP Location: Right Arm)   Pulse (!) 107   Temp (!) 103.3 F (39.6 C) (Rectal)   Resp 18   Ht 5\' 2"  (1.575 m)   Wt 84.4 kg (186 lb)   SpO2 100%   BMI 34.02 kg/m   Physical Exam  Constitutional:  Chronically ill-appearing, alert, does not follow simple commands, moaning.  HENT:  Head: Normocephalic and atraumatic.  Eyes: Conjunctivae are normal. Pupils are equal, round, and reactive to light.  Neck: Neck supple. No tracheal deviation present. No thyromegaly present.  Cardiovascular: Normal rate and regular rhythm.  Murmur heard. 2/6 systolic murmur  Pulmonary/Chest: Effort  normal and breath sounds normal.  Abdominal: Soft. Bowel sounds are normal. She exhibits no distension. There is no tenderness.  Obese  Musculoskeletal: Normal range of motion. She exhibits no edema or tenderness.  Neurological: She is alert. Coordination normal.  Skin: Skin is warm and dry. No rash noted.  Approximately baseball sized presacral decubitus ulcer, stage II.  Bilateral feet with ecchymotic areas and heels.  Bilateral feet mildly reddened.  Ecchymotic areas may be beginnings of decubiti at heels bilaterally  Psychiatric: She has a normal mood and affect.  Nursing note and vitals reviewed.    ED Treatments / Results  Labs (all labs ordered are listed, but only abnormal results are displayed) Labs Reviewed  CULTURE, BLOOD (ROUTINE X 2)  CULTURE, BLOOD (ROUTINE X 2)  COMPREHENSIVE METABOLIC PANEL  CBC WITH DIFFERENTIAL/PLATELET  URINALYSIS, ROUTINE W REFLEX MICROSCOPIC  INFLUENZA PANEL BY PCR (TYPE A & B)  I-STAT CG4 LACTIC ACID, ED  I-STAT CG4 LACTIC ACID, ED    EKG  EKG  Interpretation None       Radiology No results found.  Procedures Procedures (including critical care time)  Medications Ordered in ED Medications  piperacillin-tazobactam (ZOSYN) IVPB 3.375 g (not administered)  vancomycin (VANCOCIN) IVPB 1000 mg/200 mL premix (not administered)  acetaminophen (TYLENOL) tablet 1,000 mg (not administered)     Initial Impression / Assessment and Plan / ED Course  I have reviewed the triage vital signs and the nursing notes.  Pertinent labs & imaging results that were available during my care of the patient were reviewed by me and considered in my medical decision making (see chart for details).     Code sepsis called based on sirs criteria fever and  heart rate, source of an infection unknown  Nursing unable to establish peripheral IV access Angiocath insertion Performed by: Doug Sou   Time out: Immediately prior to procedure a "time out" was called to verify the correct patient, procedure, equipment, support staff and site/side marked as required.  Preparation: Patient was prepped and draped in the usual sterile fashion.  Vein Location: Left external jugular vein  Gauge: 20  Normal blood return and flush without difficulty Patient tolerance: Patient tolerated the procedure well with no immediate complications.   Administered stress doses of steroids Decadron 10 mg IV she is on Florinef and possibly adrenally suppressed.  She felt a rash after vancomycin started.  She was given IV Benadryl vancomycin was stopped.  I consulted infectious disease.  Spoke with Dr. Algis Liming.  He suggests Zyvox 600 mg IV every 12 hours, first dose given here in addition to Zosyn.  6:25 PM is alert and awake.  Nontoxic appearing. I have consulted Dr.Kim from hospitalist service will arrange for admission.  I have also alerted patient's guardian Lise Auer at [phone] regarding her hospital admission  Sepsis - Repeat Assessment  Performed  at:    620 pm  Vitals     Blood pressure (!) 142/69, pulse (!) 106, temperature (!) 103.3 F (39.6 C), temperature source Rectal, resp. rate 16, height 5\' 2"  (1.575 m), weight 84.4 kg (186 lb), SpO2 100 %.  Heart:     Regular rate and rhythm pulse 96 bpm  Lungs:    CTA  Capillary Refill:   <2 sec  Peripheral Pulse:   Radial pulse palpable  Skin:     Normal Color  . Results for orders placed or performed during the hospital encounter of 03/07/17  Comprehensive metabolic panel  Result Value Ref  Range   Sodium 147 (H) 135 - 145 mmol/L   Potassium 3.9 3.5 - 5.1 mmol/L   Chloride 114 (H) 101 - 111 mmol/L   CO2 22 22 - 32 mmol/L   Glucose, Bld 103 (H) 65 - 99 mg/dL   BUN 15 6 - 20 mg/dL   Creatinine, Ser 1.610.72 0.44 - 1.00 mg/dL   Calcium 8.3 (L) 8.9 - 10.3 mg/dL   Total Protein 7.2 6.5 - 8.1 g/dL   Albumin 2.3 (L) 3.5 - 5.0 g/dL   AST 27 15 - 41 U/L   ALT 17 14 - 54 U/L   Alkaline Phosphatase 150 (H) 38 - 126 U/L   Total Bilirubin 0.7 0.3 - 1.2 mg/dL   GFR calc non Af Amer >60 >60 mL/min   GFR calc Af Amer >60 >60 mL/min   Anion gap 11 5 - 15  CBC with Differential  Result Value Ref Range   WBC 14.9 (H) 4.0 - 10.5 K/uL   RBC 3.41 (L) 3.87 - 5.11 MIL/uL   Hemoglobin 11.5 (L) 12.0 - 15.0 g/dL   HCT 09.635.0 (L) 04.536.0 - 40.946.0 %   MCV 102.6 (H) 78.0 - 100.0 fL   MCH 33.7 26.0 - 34.0 pg   MCHC 32.9 30.0 - 36.0 g/dL   RDW 81.115.2 91.411.5 - 78.215.5 %   Platelets 387 150 - 400 K/uL   Neutrophils Relative % 89 %   Neutro Abs 13.2 (H) 1.7 - 7.7 K/uL   Lymphocytes Relative 9 %   Lymphs Abs 1.3 0.7 - 4.0 K/uL   Monocytes Relative 2 %   Monocytes Absolute 0.4 0.1 - 1.0 K/uL   Eosinophils Relative 0 %   Eosinophils Absolute 0.0 0.0 - 0.7 K/uL   Basophils Relative 0 %   Basophils Absolute 0.0 0.0 - 0.1 K/uL  Urinalysis, Routine w reflex microscopic  Result Value Ref Range   Color, Urine YELLOW YELLOW   APPearance CLEAR CLEAR   Specific Gravity, Urine 1.021 1.005 - 1.030   pH 6.0 5.0 - 8.0    Glucose, UA NEGATIVE NEGATIVE mg/dL   Hgb urine dipstick NEGATIVE NEGATIVE   Bilirubin Urine NEGATIVE NEGATIVE   Ketones, ur NEGATIVE NEGATIVE mg/dL   Protein, ur NEGATIVE NEGATIVE mg/dL   Nitrite NEGATIVE NEGATIVE   Leukocytes, UA NEGATIVE NEGATIVE  Influenza panel by PCR (type A & B)  Result Value Ref Range   Influenza A By PCR NEGATIVE NEGATIVE   Influenza B By PCR NEGATIVE NEGATIVE  I-Stat CG4 Lactic Acid, ED  Result Value Ref Range   Lactic Acid, Venous 2.60 (HH) 0.5 - 1.9 mmol/L   Comment NOTIFIED PHYSICIAN   I-Stat CG4 Lactic Acid, ED  (not at  [facility])  Result Value Ref Range   Lactic Acid, Venous 2.15 (HH) 0.5 - 1.9 mmol/L   Comment NOTIFIED PHYSICIAN    Dg Chest 1 View  Result Date: 02/07/2017 CLINICAL DATA:  Chest congestion EXAM: CHEST  1 VIEW COMPARISON:  02/02/2017 FINDINGS: Patchy consolidation has developed at the left base. Normal vascularity. Normal heart size. No pneumothorax. No pleural effusion. IMPRESSION: Increasing patchy consolidation at the left lung base. Followup PA and lateral chest X-ray is recommended in 3-4 weeks following trial of antibiotic therapy to ensure resolution and exclude underlying malignancy. Electronically Signed   By: Jolaine ClickArthur  Hoss M.D.   On: 02/07/2017 07:23   Dg Chest 2 View  Result Date: 03/07/2017 CLINICAL DATA:  57 year old female with altered mental status and fever. EXAM: CHEST  2 VIEW COMPARISON:  02/18/2017 chest radiographs and earlier. FINDINGS: Similar to a study on 02/02/2017 the patient's arms are not raised on the lateral view, limiting its utility. Stable lung volumes since that time. Mediastinal contours are stable and within normal limits. No pneumothorax, pulmonary edema, pleural effusion or consolidation. Mild nonspecific coarse and streaky opacity at both lung bases is noted on the frontal view, but appears stable since 02/02/2017. negative visible bowel gas pattern. No acute osseous abnormality identified. IMPRESSION: No  acute cardiopulmonary abnormality compared to 02/02/2017; there is increased interstitial opacity at both lung bases, but this appears stable since December favoring chronic lung disease over acute infection. Electronically Signed   By: Odessa Fleming M.D.   On: 03/07/2017 16:39   Dg Chest Port 1 View  Result Date: 02/18/2017 CLINICAL DATA:  57 year old female with altered mental status and fever. Recent pneumonia. EXAM: PORTABLE CHEST 1 VIEW COMPARISON:  02/07/2017 portable chest. FINDINGS: Portable AP semi upright view at 1706 hours. Stable lung volumes. Patchy left lung base opacity is stable. Similar patchy medial right lung base opacity is improved. No pneumothorax, pulmonary edema, pleural effusion or areas of worsening ventilation. Stable cardiac size and mediastinal contours. Negative visible bowel gas pattern. IMPRESSION: Stable to mildly improved patchy lung base opacity since 02/07/2017 which is nonspecific, and might be scarring or atelectasis rather than infection. No new cardiopulmonary abnormality. Electronically Signed   By: Odessa Fleming M.D.   On: 02/18/2017 17:30   Final Clinical Impressions(s) / ED Diagnoses  Diagnoses #1 sepsis #2 presacral decubitus ulcer #3 adverse reaction to vancomycin Final diagnoses:  None  CRITICAL CARE Performed by: Doug Sou Total critical care time: 30 minutes Critical care time was exclusive of separately billable procedures and treating other patients. Critical care was necessary to treat or prevent imminent or life-threatening deterioration. Critical care was time spent personally by me on the following activities: development of treatment plan with patient and/or surrogate as well as nursing, discussions with consultants, evaluation of patient's response to treatment, examination of patient, obtaining history from patient or surrogate, ordering and performing treatments and interventions, ordering and review of laboratory studies, ordering and review of  radiographic studies, pulse oximetry and re-evaluation of patient's condition.  ED Discharge Orders    None       Doug Sou, MD 03/07/17 [id number]

## 2017-03-07 NOTE — ED Triage Notes (Signed)
Per EMS: Pt is coming from a group home and has had a fever of 101.5 with EMS. Pt has a hx of developmental delay and is not verbal and does not follow commands. Staff at the group home reported that she is less alert than normal.

## 2017-03-07 NOTE — ED Notes (Signed)
RN AND MD NOTIFIED OF PATIENT'S LACTIC ACID LEVEL OF 2.60 

## 2017-03-07 NOTE — ED Notes (Signed)
Bed: WA01 Expected date:  Expected time:  Means of arrival:  Comments: hold 

## 2017-03-07 NOTE — Progress Notes (Signed)
When doing nursing admission history, [facility] states pt is on a cpap at night and states she forgot to tell the doctor. She states that patient has a court appointment legal guardian. When asked for the court appointed legal guardian form to make a copy, she presented a copy that does not have a seal on it. When asked for a copy with a seal, caregiver states that is the only copy they have in patient's notebook. ER rn and rn transferring pt to the floor notified of this. Sticky note left on pt's chart for floor rn to follow up on this.  Briscoe Burns. Carleane Waneta Fitting BSN, RN-BC Admissions RN 03/07/2017 7:47 PM

## 2017-03-07 NOTE — ED Notes (Signed)
RN attempted to stick for second set of cultures x3. Even with holding help pt still moves around constantly and pulled the catheter out

## 2017-03-08 ENCOUNTER — Inpatient Hospital Stay (HOSPITAL_COMMUNITY): Payer: Medicare Other

## 2017-03-08 DIAGNOSIS — R000 Tachycardia, unspecified: Secondary | ICD-10-CM

## 2017-03-08 DIAGNOSIS — E870 Hyperosmolality and hypernatremia: Secondary | ICD-10-CM

## 2017-03-08 DIAGNOSIS — R509 Fever, unspecified: Secondary | ICD-10-CM

## 2017-03-08 DIAGNOSIS — L89151 Pressure ulcer of sacral region, stage 1: Secondary | ICD-10-CM

## 2017-03-08 DIAGNOSIS — A419 Sepsis, unspecified organism: Principal | ICD-10-CM

## 2017-03-08 DIAGNOSIS — L8990 Pressure ulcer of unspecified site, unspecified stage: Secondary | ICD-10-CM

## 2017-03-08 LAB — ECHOCARDIOGRAM COMPLETE
AOASC: 28 cm
CHL CUP MV DEC (S): 264
CHL CUP REG VEL DIAS: 80.1 cm/s
CHL CUP TV REG PEAK VELOCITY: 274 cm/s
E decel time: 264 msec
FS: 33 % (ref 28–44)
Height: 63 in
IV/PV OW: 0.64
LA diam index: 2.16 cm/m2
LA vol A4C: 27.8 ml
LA vol: 32.5 mL
LASIZE: 36 mm
LAVOLIN: 19.5 mL/m2
LEFT ATRIUM END SYS DIAM: 36 mm
LV PW d: 11 mm — AB (ref 0.6–1.1)
LVOT area: 2.27 cm2
LVOTD: 17 mm
MVPKEVEL: 1.1 m/s
TAPSE: 25.6 mm
TR max vel: 274 cm/s
Weight: 2183.44 oz

## 2017-03-08 LAB — TSH: TSH: 2.053 u[IU]/mL (ref 0.350–4.500)

## 2017-03-08 LAB — CBC
HEMATOCRIT: 31.4 % — AB (ref 36.0–46.0)
HEMOGLOBIN: 10 g/dL — AB (ref 12.0–15.0)
MCH: 32.8 pg (ref 26.0–34.0)
MCHC: 31.8 g/dL (ref 30.0–36.0)
MCV: 103 fL — ABNORMAL HIGH (ref 78.0–100.0)
Platelets: 312 10*3/uL (ref 150–400)
RBC: 3.05 MIL/uL — ABNORMAL LOW (ref 3.87–5.11)
RDW: 15.1 % (ref 11.5–15.5)
WBC: 21.3 10*3/uL — ABNORMAL HIGH (ref 4.0–10.5)

## 2017-03-08 LAB — RESPIRATORY PANEL BY PCR
Adenovirus: NOT DETECTED
Bordetella pertussis: NOT DETECTED
CORONAVIRUS OC43-RVPPCR: NOT DETECTED
Chlamydophila pneumoniae: NOT DETECTED
Coronavirus 229E: NOT DETECTED
Coronavirus HKU1: NOT DETECTED
Coronavirus NL63: NOT DETECTED
INFLUENZA A-RVPPCR: NOT DETECTED
INFLUENZA B-RVPPCR: NOT DETECTED
METAPNEUMOVIRUS-RVPPCR: NOT DETECTED
Mycoplasma pneumoniae: NOT DETECTED
PARAINFLUENZA VIRUS 1-RVPPCR: NOT DETECTED
PARAINFLUENZA VIRUS 2-RVPPCR: NOT DETECTED
PARAINFLUENZA VIRUS 4-RVPPCR: NOT DETECTED
Parainfluenza Virus 3: NOT DETECTED
RESPIRATORY SYNCYTIAL VIRUS-RVPPCR: NOT DETECTED
Rhinovirus / Enterovirus: NOT DETECTED

## 2017-03-08 LAB — COMPREHENSIVE METABOLIC PANEL
ALBUMIN: 1.9 g/dL — AB (ref 3.5–5.0)
ALT: 15 U/L (ref 14–54)
AST: 18 U/L (ref 15–41)
Alkaline Phosphatase: 123 U/L (ref 38–126)
Anion gap: 8 (ref 5–15)
BILIRUBIN TOTAL: 0.2 mg/dL — AB (ref 0.3–1.2)
BUN: 13 mg/dL (ref 6–20)
CO2: 23 mmol/L (ref 22–32)
Calcium: 8.2 mg/dL — ABNORMAL LOW (ref 8.9–10.3)
Chloride: 114 mmol/L — ABNORMAL HIGH (ref 101–111)
Creatinine, Ser: 0.68 mg/dL (ref 0.44–1.00)
GFR calc Af Amer: >60 mL/min (ref >60)
GFR calc non Af Amer: >60 mL/min (ref >60)
GLUCOSE: 129 mg/dL — AB (ref 65–99)
Potassium: 3.6 mmol/L (ref 3.5–5.1)
SODIUM: 145 mmol/L (ref 135–145)
TOTAL PROTEIN: 6.3 g/dL — AB (ref 6.5–8.1)

## 2017-03-08 LAB — TROPONIN I: Troponin I: <0.03 ng/mL (ref <0.03)

## 2017-03-08 LAB — SEDIMENTATION RATE: SED RATE: 110 mm/h — AB (ref 0–22)

## 2017-03-08 LAB — MRSA PCR SCREENING: MRSA by PCR: NEGATIVE

## 2017-03-08 MED ORDER — LEVETIRACETAM 500 MG PO TABS
1250.0000 mg | ORAL_TABLET | Freq: Two times a day (BID) | ORAL | Status: DC
  Filled 2017-03-08: qty 1

## 2017-03-08 MED ORDER — RIVAROXABAN 20 MG PO TABS
20.0000 mg | ORAL_TABLET | Freq: Every day | ORAL | Status: DC
  Administered 2017-03-08 – 2017-03-10 (×3): 20 mg via ORAL
  Filled 2017-03-08 (×3): qty 1

## 2017-03-08 MED ORDER — FLUDROCORTISONE ACETATE 0.1 MG PO TABS
0.1000 mg | ORAL_TABLET | Freq: Every day | ORAL | Status: DC
  Administered 2017-03-08 – 2017-03-11 (×4): 0.1 mg via ORAL
  Filled 2017-03-08 (×4): qty 1

## 2017-03-08 MED ORDER — MONTELUKAST SODIUM 10 MG PO TABS
10.0000 mg | ORAL_TABLET | Freq: Every day | ORAL | Status: DC
  Administered 2017-03-08 – 2017-03-11 (×4): 10 mg via ORAL
  Filled 2017-03-08 (×4): qty 1

## 2017-03-08 MED ORDER — MEMANTINE HCL 10 MG PO TABS
5.0000 mg | ORAL_TABLET | Freq: Every day | ORAL | Status: DC
  Administered 2017-03-08 – 2017-03-11 (×4): 5 mg via ORAL
  Filled 2017-03-08 (×4): qty 1

## 2017-03-08 MED ORDER — LAMOTRIGINE 100 MG PO TABS
100.0000 mg | ORAL_TABLET | Freq: Every day | ORAL | Status: DC
  Administered 2017-03-08 – 2017-03-11 (×4): 100 mg via ORAL
  Filled 2017-03-08 (×4): qty 1

## 2017-03-08 MED ORDER — PANTOPRAZOLE SODIUM 40 MG PO TBEC
40.0000 mg | DELAYED_RELEASE_TABLET | Freq: Every day | ORAL | Status: DC
  Administered 2017-03-08: 40 mg via ORAL
  Filled 2017-03-08 (×2): qty 1

## 2017-03-08 MED ORDER — SODIUM CHLORIDE 0.9 % IV SOLN
1250.0000 mg | Freq: Two times a day (BID) | INTRAVENOUS | Status: DC
  Administered 2017-03-08 – 2017-03-09 (×3): 1250 mg via INTRAVENOUS
  Filled 2017-03-08 (×3): qty 12.5

## 2017-03-08 MED ORDER — DOCUSATE SODIUM 100 MG PO CAPS
100.0000 mg | ORAL_CAPSULE | Freq: Every day | ORAL | Status: DC
  Filled 2017-03-08: qty 1

## 2017-03-08 MED ORDER — LEVOTHYROXINE SODIUM 100 MCG PO TABS
100.0000 ug | ORAL_TABLET | Freq: Every day | ORAL | Status: DC
  Administered 2017-03-08 – 2017-03-11 (×4): 100 ug via ORAL
  Filled 2017-03-08 (×4): qty 1

## 2017-03-08 MED ORDER — COLLAGENASE 250 UNIT/GM EX OINT
TOPICAL_OINTMENT | Freq: Every day | CUTANEOUS | Status: DC
  Administered 2017-03-08 – 2017-03-11 (×4): via TOPICAL
  Filled 2017-03-08: qty 90

## 2017-03-08 MED ORDER — MUSCLE RUB 10-15 % EX CREA
1.0000 "application " | TOPICAL_CREAM | Freq: Two times a day (BID) | CUTANEOUS | Status: DC
  Administered 2017-03-08 – 2017-03-11 (×7): 1 via TOPICAL
  Filled 2017-03-08: qty 85

## 2017-03-08 NOTE — Progress Notes (Signed)
  Echocardiogram 2D Echocardiogram has been performed.  Brooke Palmer 03/08/2017, 2:14 PM

## 2017-03-08 NOTE — Progress Notes (Signed)
Pt has had no urine output thru the external catheter since arrival to the unit. Bladder scan 280 mls. MD notified. New order for in and out catheter with bladder scan > 500 MLS. Will continue to monitor

## 2017-03-08 NOTE — Progress Notes (Signed)
ANTICOAGULATION CONSULT NOTE - Initial Consult  Pharmacy Consult for xarelto Indication: DVT  Allergies  Allergen Reactions  . Vancomycin Hives    Patient Measurements: Height: 5\' 3"  (160 cm) Weight: 136 lb 7.4 oz (61.9 kg) IBW/kg (Calculated) : 52.4   Vital Signs: Temp: 99.8 F (37.7 C) (01/24 2004) Temp Source: Axillary (01/24 2004) BP: 112/54 (01/24 2004) Pulse Rate: 96 (01/24 2004)  Labs: Recent Labs    03/07/17 1708  HGB 11.5*  HCT 35.0*  PLT 387  CREATININE 0.72    Estimated Creatinine Clearance: 64.2 mL/min (by C-G formula based on SCr of 0.72 mg/dL).   Medical History: Past Medical History:  Diagnosis Date  . Arthritis    osteo  . DVT (deep venous thrombosis) (HCC)    bilateral legs  . Dysphasia   . Insomnia   . PTSD (post-traumatic stress disorder)   . Seizures (HCC)     Medications:  Scheduled:  . feeding supplement (PRO-STAT SUGAR FREE 64)  30 mL Oral BID  . linezolid  600 mg Oral Q12H  . rivaroxaban  20 mg Oral Q supper   Infusions:  . sodium chloride 75 mL/hr at 03/07/17 2123  . piperacillin-tazobactam (ZOSYN)  IV      Assessment: 56 yoF c/o fever and chills on xarelto PTA for DVT.  Patient was ordered Lovenox 40 mg for DVT prophylaxis and received x1 dose at 2123 1/24.   Goal of Therapy:  anticoagulation    Plan:  [facility] 20 mg daily  Lorenza EvangelistGreen, Irasema Chalk R 03/08/2017,12:20 AM

## 2017-03-08 NOTE — Progress Notes (Signed)
PHARMACY NOTE -  Xarelto  Pharmacy has been assisting with dosing of Xarelto for hx VTE. Dosage remains stable at 20 mg PO daily and need for further dosage adjustment appears unlikely at present.    Will sign off at this time.  Please reconsult if a change in clinical status warrants re-evaluation of dosage.  Bernadene Personrew Tangie Stay, PharmD, BCPS [phone] 03/08/2017, 12:47 PM

## 2017-03-08 NOTE — Care Management Note (Signed)
Case Management Note  Patient Details  Name: Brooke Palmer MRN: [id number] Date of Birth: 06/14/1960  Subjective/Objective:                  sepsis  Action/Plan: Date: March 08, 2017 Brooke Palmer, BSN, [place], [place] [phone] Chart and notes review for patient progress and needs. Will follow for case management and discharge needs. No cm or discharge needs present at time of this review. Next review date: [id number]  Expected Discharge Date:  (unknown)               Expected Discharge Plan:  Home/Self Care  In-House Referral:     Discharge planning Services  CM Consult  Post Acute Care Choice:    Choice offered to:     DME Arranged:    DME Agency:     HH Arranged:    HH Agency:     Status of Service:  In process, will continue to follow  If discussed at Long Length of Stay Meetings, dates discussed:    Additional Comments:  Brooke Palmer, Brooke Lynn, RN 03/08/2017, 8:10 AM

## 2017-03-08 NOTE — Progress Notes (Signed)
PROGRESS NOTE  Brooke Palmer FXJ:[id number] DOB: 23-Feb-1960 DOA: 03/07/2017 PCP: Trinidad Curet   LOS: 1 day   Brief Narrative / Interim history: Brooke Palmer  is a 57 y.o. female, w Down syndrome, ptsd, seizure,  DVT, Sacral decub apparently sent from group home to ER for fever of 105. Pt is unable to provide any history due to dementia.    Assessment & Plan: Principal Problem:   Sepsis ([place]) Active Problems:   Fever, unspecified   Tachycardia   Hypernatremia   Anemia   Pressure injury of skin   SIRS  -Patient met criteria with fever, tachycardia, lactic acidosis and leukocytosis -Not clear source, patient had sacral pressure injury with skin breakdown however that does not look deep nor infected at this point -Influenza was negative, respiratory virus panel negative -Chest x-ray with increased interstitial opacities at the bases, appearing stable however cannot fully exclude acute infection -Patient started on vancomycin and Zosyn, continue with Zosyn alone -Sepsis physiology improving, she is no longer tachycardic, afebrile this morning and blood pressure is normal.  Hypernatremia -Likely in the setting of poor p.o. intake, sodium has now normalized with fluids  Anemia of chronic disease -Hemoglobin stable  Sacral decubitus wound -Only skin breakdown, wound care to see  History of DVT -Continue Xarelto  Seizure disorder -Continue Keppra   DVT prophylaxis: Xarelto Code Status: Full code Family Communication: No family at bedside Disposition Plan: Back to group home when ready  Consultants:   None  Procedures:   2D echo: pending  Antimicrobials:   Vancomycin 1/24   Zosyn 1/24 >>  Subjective: -Nonverbal, she appears alert and able to track me with her eyes  Objective: Vitals:   03/07/17 1920 03/07/17 2004 03/08/17 0541 03/08/17 0622  BP: 107/62 (!) 112/54 (!) 116/54   Pulse: 96 96 69   Resp: (!) 26 20 20    Temp:  99.8 F (37.7 C) 98.3 F  (36.8 C)   TempSrc:  Axillary Oral   SpO2: 93%  95%   Weight:  61.9 kg (136 lb 7.4 oz)  61.9 kg (136 lb 7.4 oz)  Height:  5' 3"  (1.6 m)      Intake/Output Summary (Last 24 hours) at 03/08/2017 1127 Last data filed at 03/08/2017 0730 Gross per 24 hour  Intake 850 ml  Output 400 ml  Net 450 ml   Filed Weights   03/07/17 1543 03/07/17 2004 03/08/17 0622  Weight: 84.4 kg (186 lb) 61.9 kg (136 lb 7.4 oz) 61.9 kg (136 lb 7.4 oz)    Examination:  Constitutional: NAD Eyes:  lids and conjunctivae normal ENMT: Mucous membranes are moist.  Respiratory: clear to auscultation bilaterally, no wheezing, no crackles. Normal respiratory effort.  Cardiovascular: Regular rate and rhythm, no murmurs / rubs / gallops. No LE edema. 2+ pedal pulses.  Abdomen: no tenderness. Bowel sounds positive.  Skin: 2 patches of skin breakdown on her sacrum, 2 pressure areas on her heels Neurologic: Appears nonfocal, spontaneously moves all 4 extremities however does not follow commands consistently   Data Reviewed: I have independently reviewed following labs and imaging studies   CBC: Recent Labs  Lab 03/07/17 1708 03/08/17 0457  WBC 14.9* 21.3*  NEUTROABS 13.2*  --   HGB 11.5* 10.0*  HCT 35.0* 31.4*  MCV 102.6* 103.0*  PLT 387 641   Basic Metabolic Panel: Recent Labs  Lab 03/07/17 1708 03/08/17 0457  NA 147* 145  K 3.9 3.6  CL 114* 114*  CO2 22 23  GLUCOSE 103* 129*  BUN 15 13  CREATININE 0.72 0.68  CALCIUM 8.3* 8.2*   GFR: Estimated Creatinine Clearance: 64.2 mL/min (by C-G formula based on SCr of 0.68 mg/dL). Liver Function Tests: Recent Labs  Lab 03/07/17 1708 03/08/17 0457  AST 27 18  ALT 17 15  ALKPHOS 150* 123  BILITOT 0.7 0.2*  PROT 7.2 6.3*  ALBUMIN 2.3* 1.9*   No results for input(s): LIPASE, AMYLASE in the last 168 hours. No results for input(s): AMMONIA in the last 168 hours. Coagulation Profile: No results for input(s): INR, PROTIME in the last 168  hours. Cardiac Enzymes: Recent Labs  Lab 03/08/17 0457  TROPONINI <0.03   BNP (last 3 results) No results for input(s): PROBNP in the last 8760 hours. HbA1C: No results for input(s): HGBA1C in the last 72 hours. CBG: No results for input(s): GLUCAP in the last 168 hours. Lipid Profile: No results for input(s): CHOL, HDL, LDLCALC, TRIG, CHOLHDL, LDLDIRECT in the last 72 hours. Thyroid Function Tests: Recent Labs    03/08/17 0457  TSH 2.053   Anemia Panel: No results for input(s): VITAMINB12, FOLATE, FERRITIN, TIBC, IRON, RETICCTPCT in the last 72 hours. Urine analysis:    Component Value Date/Time   COLORURINE YELLOW 03/07/2017 1708   APPEARANCEUR CLEAR 03/07/2017 1708   LABSPEC 1.021 03/07/2017 1708   PHURINE 6.0 03/07/2017 1708   GLUCOSEU NEGATIVE 03/07/2017 1708   HGBUR NEGATIVE 03/07/2017 1708   BILIRUBINUR NEGATIVE 03/07/2017 1708   KETONESUR NEGATIVE 03/07/2017 1708   PROTEINUR NEGATIVE 03/07/2017 1708   NITRITE NEGATIVE 03/07/2017 1708   LEUKOCYTESUR NEGATIVE 03/07/2017 1708   Sepsis Labs: Invalid input(s): PROCALCITONIN, LACTICIDVEN  Recent Results (from the past 240 hour(s))  Blood Culture (routine x 2)     Status: None (Preliminary result)   Collection Time: 03/07/17  4:42 PM  Result Value Ref Range Status   Specimen Description BLOOD  Final   Special Requests   Final    BOTTLES DRAWN AEROBIC ONLY Blood Culture adequate volume LEFT EJ   Culture PENDING  Incomplete   Report Status PENDING  Incomplete  Respiratory Panel by PCR     Status: None   Collection Time: 03/08/17  2:01 AM  Result Value Ref Range Status   Adenovirus NOT DETECTED NOT DETECTED Final   Coronavirus 229E NOT DETECTED NOT DETECTED Final   Coronavirus HKU1 NOT DETECTED NOT DETECTED Final   Coronavirus NL63 NOT DETECTED NOT DETECTED Final   Coronavirus OC43 NOT DETECTED NOT DETECTED Final   Metapneumovirus NOT DETECTED NOT DETECTED Final   Rhinovirus / Enterovirus NOT DETECTED NOT  DETECTED Final   Influenza A NOT DETECTED NOT DETECTED Final   Influenza B NOT DETECTED NOT DETECTED Final   Parainfluenza Virus 1 NOT DETECTED NOT DETECTED Final   Parainfluenza Virus 2 NOT DETECTED NOT DETECTED Final   Parainfluenza Virus 3 NOT DETECTED NOT DETECTED Final   Parainfluenza Virus 4 NOT DETECTED NOT DETECTED Final   Respiratory Syncytial Virus NOT DETECTED NOT DETECTED Final   Bordetella pertussis NOT DETECTED NOT DETECTED Final   Chlamydophila pneumoniae NOT DETECTED NOT DETECTED Final   Mycoplasma pneumoniae NOT DETECTED NOT DETECTED Final  MRSA PCR Screening     Status: None   Collection Time: 03/08/17  2:01 AM  Result Value Ref Range Status   MRSA by PCR NEGATIVE NEGATIVE Final    Comment:        The GeneXpert MRSA Assay (FDA approved for NASAL specimens only), is one component of  a comprehensive MRSA colonization surveillance program. It is not intended to diagnose MRSA infection nor to guide or monitor treatment for MRSA infections.       Radiology Studies: Dg Chest 2 View  Result Date: 03/07/2017 CLINICAL DATA:  57 year old female with altered mental status and fever. EXAM: CHEST  2 VIEW COMPARISON:  02/18/2017 chest radiographs and earlier. FINDINGS: Similar to a study on 02/02/2017 the patient's arms are not raised on the lateral view, limiting its utility. Stable lung volumes since that time. Mediastinal contours are stable and within normal limits. No pneumothorax, pulmonary edema, pleural effusion or consolidation. Mild nonspecific coarse and streaky opacity at both lung bases is noted on the frontal view, but appears stable since 02/02/2017. negative visible bowel gas pattern. No acute osseous abnormality identified. IMPRESSION: No acute cardiopulmonary abnormality compared to 02/02/2017; there is increased interstitial opacity at both lung bases, but this appears stable since December favoring chronic lung disease over acute infection. Electronically  Signed   By: Genevie Ann M.D.   On: 03/07/2017 16:39     Scheduled Meds: . collagenase   Topical Daily  . docusate sodium  100 mg Oral Daily  . feeding supplement (PRO-STAT SUGAR FREE 64)  30 mL Oral BID  . fludrocortisone  0.1 mg Oral Daily  . lamoTRIgine  100 mg Oral Daily  . levothyroxine  100 mcg Oral Daily  . linezolid  600 mg Oral Q12H  . memantine  5 mg Oral Daily  . montelukast  10 mg Oral Daily  . MUSCLE RUB  1 application Topical BID  . pantoprazole  40 mg Oral Daily  . rivaroxaban  20 mg Oral Q supper   Continuous Infusions: . levETIRAcetam Stopped (03/08/17 0500)  . piperacillin-tazobactam (ZOSYN)  IV 3.375 g (03/08/17 0805)     Marzetta Board, MD, PhD [facility] Pager [phone]  If 7PM-7AM, please contact night-coverage [email or web address] Password [facility] 03/08/2017, 11:27 AM

## 2017-03-08 NOTE — Progress Notes (Signed)
LCSW following for facility placement.   Patient is from [facility].   LCSW completed assessment on 02/07/17. No significant changes since last assessment.   Patient was admitted for Sepsis.  Patient will return to facility when medically stable.   LCSW will continue to follow.   Beulah GandyBernette Jeffry Vogelsang, LSCW [facility] [phone]

## 2017-03-08 NOTE — Consult Note (Signed)
WOC Nurse wound consult note Reason for Consult:coccygeal and bilateral buttock pressure injuries, full thickness, but unstageable due ott he presence of necrotic slough obscuring base. Deep tissue pressure injuries to bilateral heels, L>R Wound type:Pressure plus moisture (patient is incontinent of urine with in house management by [employer] external female powered urinary incontinence collector) Pressure Injury POA: Yes Measurement: Left buttock: 4cm x 6cm x 0.2cm Stage 3 Right buttock to coccygeal area:  4cm x 8cm with depth unable to be determined due to the presence of necrotic slough in the wound bed-particularly at the center which is suspected to be there areas of greatest depth Right heel with 2cm round area of purple discoloration with non-blanching erythema surrounding measuring 6cm round. Left heel with 4cm x 3cm area of purple with non-blanching erythema surrounding to 8cm round Wound bed:As described above Drainage (amount, consistency, odor) None from heels.  Scant serous from buttock and coccygeal Unstageable pressure wounds Periwound:Intact Dressing procedure/placement/frequency:Patient will today be placed on a therapeutic mattress replacement with low air loss feature, additionally we will provide pressure redistribution heel boots.  We will begin daily wound care using collagenase as an enzymatic debriding agent and top with a saline moistened gauze dressing. Patient to be turned side to side, minimizing time spent in the supine position and the urinary incontinence will be managed with [employer]. WOC nursing team will not follow routinely, but will remain available to this patient, the nursing and medical teams.  Please re-consult if needed. Thanks, Ladona MowLaurie Trejon Duford, MSN, RN, GNP, Hans EdenCWOCN, CWON-AP, FAAN  Pager# [phone]

## 2017-03-08 NOTE — Progress Notes (Signed)
Pt arrived to unit room 1507  With CNA from [facility] at bedside . Marland Kitchen. Pt is alert and disoriented x3. 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 Will continue to monitor

## 2017-03-09 DIAGNOSIS — L89153 Pressure ulcer of sacral region, stage 3: Secondary | ICD-10-CM

## 2017-03-09 MED ORDER — PANTOPRAZOLE SODIUM 40 MG PO PACK
40.0000 mg | PACK | Freq: Every day | ORAL | Status: DC
  Administered 2017-03-09 – 2017-03-11 (×3): 40 mg via ORAL
  Filled 2017-03-09 (×3): qty 20

## 2017-03-09 MED ORDER — LEVETIRACETAM 100 MG/ML PO SOLN
1250.0000 mg | Freq: Two times a day (BID) | ORAL | Status: DC
  Administered 2017-03-09 – 2017-03-11 (×4): 1250 mg via ORAL
  Filled 2017-03-09 (×4): qty 15

## 2017-03-09 MED ORDER — HYDRALAZINE HCL 20 MG/ML IJ SOLN
10.0000 mg | Freq: Once | INTRAMUSCULAR | Status: AC
  Administered 2017-03-09: 10 mg via INTRAVENOUS
  Filled 2017-03-09: qty 1

## 2017-03-09 MED ORDER — DOCUSATE SODIUM 50 MG/5ML PO LIQD
100.0000 mg | Freq: Every day | ORAL | Status: DC
  Administered 2017-03-10 – 2017-03-11 (×2): 100 mg via ORAL
  Filled 2017-03-09 (×2): qty 10

## 2017-03-09 NOTE — Progress Notes (Signed)
Pt. seen @2230  for CPAP order, was being worked with by staff and is unable for CPAP to be placed at this time, in room with necessary equipment if/when needed, is normally at group home where staff are able to work with pt. more adequately, RT unable to explain/place equipment at this time.

## 2017-03-09 NOTE — Progress Notes (Signed)
PROGRESS NOTE  Brooke Palmer HRC:[id number] DOB: 25-Jun-1960 DOA: 03/07/2017 PCP: Trinidad Curet   LOS: 2 days   Brief Narrative / Interim history: Brooke Palmer  is a 57 y.o. female, w Down syndrome, ptsd, seizure,  DVT, Sacral decub apparently sent from group home to ER for fever of 105. Pt is unable to provide any history due to dementia.    Assessment & Plan: Principal Problem:   Sepsis ([place]) Active Problems:   Fever, unspecified   Tachycardia   Hypernatremia   Anemia   Pressure injury of skin   SIRS  -Patient met criteria with fever, tachycardia, lactic acidosis and leukocytosis -Not clear source, patient had sacral pressure injury with skin breakdown however that does not look deep nor infected at this point -Influenza was negative, respiratory virus panel negative -Chest x-ray with increased interstitial opacities at the bases, appearing stable however cannot fully exclude acute infection / PNA -Patient started on vancomycin and Zosyn, continue with Zosyn alone -Sepsis physiology resolved  Hypernatremia -Likely in the setting of poor p.o. intake, sodium has now normalized with fluids -Repeat sodium tomorrow morning  Anemia of chronic disease -Hemoglobin stable, repeat CBC tomorrow morning  Sacral decubitus wound -Appreciate wound care consultation  History of DVT -Continue Xarelto  Seizure disorder -Continue Keppra   DVT prophylaxis: Xarelto Code Status: Full code Family Communication: No family at bedside Disposition Plan: Back to group home when ready  Consultants:   None  Procedures:   2D echo Study Conclusions - Left ventricle: The cavity size was normal. Systolic function was normal. The estimated ejection fraction was in the range of 55% to 60%. Wall motion was normal; there were no regional wall motion abnormalities. Left ventricular diastolic function parameters were normal. - Pericardium, extracardiac: A trivial pericardial effusion was  identified.   Antimicrobials:   Vancomycin 1/24   Zosyn 1/24 >>  Subjective: -Nonverbal, smiling but minimally interactive  Objective: Vitals:   03/08/17 1400 03/08/17 2056 03/09/17 0501 03/09/17 0534  BP: (!) 91/41 (!) 149/128 (!) 123/110   Pulse: 67 75 69   Resp: 16 20 20    Temp: 98.5 F (36.9 C) 97.7 F (36.5 C) 98.1 F (36.7 C)   TempSrc: Oral Oral Oral   SpO2: 90% 97% 100%   Weight:    58.1 kg (128 lb)  Height:        Intake/Output Summary (Last 24 hours) at 03/09/2017 1046 Last data filed at 03/09/2017 0815 Gross per 24 hour  Intake 240 ml  Output 1100 ml  Net -860 ml   Filed Weights   03/07/17 2004 03/08/17 0622 03/09/17 0534  Weight: 61.9 kg (136 lb 7.4 oz) 61.9 kg (136 lb 7.4 oz) 58.1 kg (128 lb)    Examination:  Constitutional: No apparent distress Eyes: Lids and conjunctivae normal ENMT: Moist mucous membranes Respiratory: Clear on anterior fields, no wheezing, no crackles, normal respiratory effort without accessory muscle use Cardiovascular: Regular rate and rhythm, no murmurs, no peripheral edema, good pulses Abdomen: Soft, nontender, nondistended.  Positive bowel sounds Neurologic: Moves all 4 extremities, does not follow commands   Data Reviewed: I have independently reviewed following labs and imaging studies   CBC: Recent Labs  Lab 03/07/17 1708 03/08/17 0457  WBC 14.9* 21.3*  NEUTROABS 13.2*  --   HGB 11.5* 10.0*  HCT 35.0* 31.4*  MCV 102.6* 103.0*  PLT 387 680   Basic Metabolic Panel: Recent Labs  Lab 03/07/17 1708 03/08/17 0457  NA 147* 145  K  3.9 3.6  CL 114* 114*  CO2 22 23  GLUCOSE 103* 129*  BUN 15 13  CREATININE 0.72 0.68  CALCIUM 8.3* 8.2*   GFR: Estimated Creatinine Clearance: 64.2 mL/min (by C-G formula based on SCr of 0.68 mg/dL). Liver Function Tests: Recent Labs  Lab 03/07/17 1708 03/08/17 0457  AST 27 18  ALT 17 15  ALKPHOS 150* 123  BILITOT 0.7 0.2*  PROT 7.2 6.3*  ALBUMIN 2.3* 1.9*   No  results for input(s): LIPASE, AMYLASE in the last 168 hours. No results for input(s): AMMONIA in the last 168 hours. Coagulation Profile: No results for input(s): INR, PROTIME in the last 168 hours. Cardiac Enzymes: Recent Labs  Lab 03/08/17 0457  TROPONINI <0.03   BNP (last 3 results) No results for input(s): PROBNP in the last 8760 hours. HbA1C: No results for input(s): HGBA1C in the last 72 hours. CBG: No results for input(s): GLUCAP in the last 168 hours. Lipid Profile: No results for input(s): CHOL, HDL, LDLCALC, TRIG, CHOLHDL, LDLDIRECT in the last 72 hours. Thyroid Function Tests: Recent Labs    03/08/17 0457  TSH 2.053   Anemia Panel: No results for input(s): VITAMINB12, FOLATE, FERRITIN, TIBC, IRON, RETICCTPCT in the last 72 hours. Urine analysis:    Component Value Date/Time   COLORURINE YELLOW 03/07/2017 1708   APPEARANCEUR CLEAR 03/07/2017 1708   LABSPEC 1.021 03/07/2017 1708   PHURINE 6.0 03/07/2017 1708   GLUCOSEU NEGATIVE 03/07/2017 1708   HGBUR NEGATIVE 03/07/2017 1708   BILIRUBINUR NEGATIVE 03/07/2017 1708   KETONESUR NEGATIVE 03/07/2017 1708   PROTEINUR NEGATIVE 03/07/2017 1708   NITRITE NEGATIVE 03/07/2017 1708   LEUKOCYTESUR NEGATIVE 03/07/2017 1708   Sepsis Labs: Invalid input(s): PROCALCITONIN, LACTICIDVEN  Recent Results (from the past 240 hour(s))  Blood Culture (routine x 2)     Status: None (Preliminary result)   Collection Time: 03/07/17  4:42 PM  Result Value Ref Range Status   Specimen Description BLOOD  Final   Special Requests   Final    BOTTLES DRAWN AEROBIC ONLY Blood Culture adequate volume LEFT EJ   Culture   Final    NO GROWTH < 24 HOURS Performed at [facility], [address] [place], [place] [address]    Report Status PENDING  Incomplete  Blood Culture (routine x 2)     Status: None (Preliminary result)   Collection Time: 03/07/17  5:09 PM  Result Value Ref Range Status   Specimen Description BLOOD RIGHT WRIST   Final   Special Requests IN PEDIATRIC BOTTLE Blood Culture adequate volume  Final   Culture   Final    NO GROWTH < 24 HOURS Performed at [facility], [place] [address] [place], [place] [address]    Report Status PENDING  Incomplete  Respiratory Panel by PCR     Status: None   Collection Time: 03/08/17  2:01 AM  Result Value Ref Range Status   Adenovirus NOT DETECTED NOT DETECTED Final   Coronavirus 229E NOT DETECTED NOT DETECTED Final   Coronavirus HKU1 NOT DETECTED NOT DETECTED Final   Coronavirus NL63 NOT DETECTED NOT DETECTED Final   Coronavirus OC43 NOT DETECTED NOT DETECTED Final   Metapneumovirus NOT DETECTED NOT DETECTED Final   Rhinovirus / Enterovirus NOT DETECTED NOT DETECTED Final   Influenza A NOT DETECTED NOT DETECTED Final   Influenza B NOT DETECTED NOT DETECTED Final   Parainfluenza Virus 1 NOT DETECTED NOT DETECTED Final   Parainfluenza Virus 2 NOT  DETECTED NOT DETECTED Final   Parainfluenza Virus 3 NOT DETECTED NOT DETECTED Final   Parainfluenza Virus 4 NOT DETECTED NOT DETECTED Final   Respiratory Syncytial Virus NOT DETECTED NOT DETECTED Final   Bordetella pertussis NOT DETECTED NOT DETECTED Final   Chlamydophila pneumoniae NOT DETECTED NOT DETECTED Final   Mycoplasma pneumoniae NOT DETECTED NOT DETECTED Final  MRSA PCR Screening     Status: None   Collection Time: 03/08/17  2:01 AM  Result Value Ref Range Status   MRSA by PCR NEGATIVE NEGATIVE Final    Comment:        The GeneXpert MRSA Assay (FDA approved for NASAL specimens only), is one component of a comprehensive MRSA colonization surveillance program. It is not intended to diagnose MRSA infection nor to guide or monitor treatment for MRSA infections.       Radiology Studies: Dg Chest 2 View  Result Date: 03/07/2017 CLINICAL DATA:  57 year old female with altered mental status and fever. EXAM: CHEST  2 VIEW COMPARISON:  02/18/2017 chest radiographs and earlier. FINDINGS: Similar to a study  on 02/02/2017 the patient's arms are not raised on the lateral view, limiting its utility. Stable lung volumes since that time. Mediastinal contours are stable and within normal limits. No pneumothorax, pulmonary edema, pleural effusion or consolidation. Mild nonspecific coarse and streaky opacity at both lung bases is noted on the frontal view, but appears stable since 02/02/2017. negative visible bowel gas pattern. No acute osseous abnormality identified. IMPRESSION: No acute cardiopulmonary abnormality compared to 02/02/2017; there is increased interstitial opacity at both lung bases, but this appears stable since December favoring chronic lung disease over acute infection. Electronically Signed   By: Genevie Ann M.D.   On: 03/07/2017 16:39     Scheduled Meds: . collagenase   Topical Daily  . docusate sodium  100 mg Oral Daily  . feeding supplement (PRO-STAT SUGAR FREE 64)  30 mL Oral BID  . fludrocortisone  0.1 mg Oral Daily  . lamoTRIgine  100 mg Oral Daily  . levothyroxine  100 mcg Oral Daily  . linezolid  600 mg Oral Q12H  . memantine  5 mg Oral Daily  . montelukast  10 mg Oral Daily  . MUSCLE RUB  1 application Topical BID  . pantoprazole  40 mg Oral Daily  . rivaroxaban  20 mg Oral Q supper   Continuous Infusions: . levETIRAcetam 1,250 mg (03/09/17 0820)  . piperacillin-tazobactam (ZOSYN)  IV 3.375 g (03/09/17 0388)     Marzetta Board, MD, PhD [facility] Pager [phone]  If 7PM-7AM, please contact night-coverage [email or web address] Password [facility] 03/09/2017, 10:46 AM

## 2017-03-09 NOTE — Progress Notes (Signed)
Pt's diastolic BP was 110 ~0500. Contacted floor coverage via text page and was given one time dose of hydralazine 10mg . Will continue to monitor and reassess.

## 2017-03-09 NOTE — Progress Notes (Signed)
Pt showed signs of difficulty swallowing when taking her Keppra oral solution this evening. Would swish the solution in her mouth and cough and tried to swallow frequently but took about 3 minutes to finally swallow it down.

## 2017-03-10 LAB — COMPREHENSIVE METABOLIC PANEL
ALT: 15 U/L (ref 14–54)
ANION GAP: 7 (ref 5–15)
AST: 24 U/L (ref 15–41)
Albumin: 2.3 g/dL — ABNORMAL LOW (ref 3.5–5.0)
Alkaline Phosphatase: 123 U/L (ref 38–126)
BILIRUBIN TOTAL: 0.3 mg/dL (ref 0.3–1.2)
BUN: 13 mg/dL (ref 6–20)
CO2: 24 mmol/L (ref 22–32)
Calcium: 8 mg/dL — ABNORMAL LOW (ref 8.9–10.3)
Chloride: 112 mmol/L — ABNORMAL HIGH (ref 101–111)
Creatinine, Ser: 0.72 mg/dL (ref 0.44–1.00)
Glucose, Bld: 111 mg/dL — ABNORMAL HIGH (ref 65–99)
POTASSIUM: 2.8 mmol/L — AB (ref 3.5–5.1)
Sodium: 143 mmol/L (ref 135–145)
TOTAL PROTEIN: 6.7 g/dL (ref 6.5–8.1)

## 2017-03-10 LAB — CBC
HEMATOCRIT: 34.7 % — AB (ref 36.0–46.0)
Hemoglobin: 11.5 g/dL — ABNORMAL LOW (ref 12.0–15.0)
MCH: 33.5 pg (ref 26.0–34.0)
MCHC: 33.1 g/dL (ref 30.0–36.0)
MCV: 101.2 fL — AB (ref 78.0–100.0)
Platelets: 335 10*3/uL (ref 150–400)
RBC: 3.43 MIL/uL — ABNORMAL LOW (ref 3.87–5.11)
RDW: 14.7 % (ref 11.5–15.5)
WBC: 7.9 10*3/uL (ref 4.0–10.5)

## 2017-03-10 MED ORDER — POTASSIUM CHLORIDE CRYS ER 20 MEQ PO TBCR: 40.0000 meq | EXTENDED_RELEASE_TABLET | ORAL | Status: DC

## 2017-03-10 MED ORDER — POTASSIUM CHLORIDE 20 MEQ/15ML (10%) PO SOLN
40.0000 meq | ORAL | Status: AC
  Administered 2017-03-10 (×2): 40 meq via ORAL
  Filled 2017-03-10 (×2): qty 30

## 2017-03-10 NOTE — Progress Notes (Signed)
PROGRESS NOTE  Brooke Palmer SAY:[id number] DOB: February 28, 1960 DOA: 03/07/2017 PCP: Trinidad Curet   LOS: 3 days   Brief Narrative / Interim history: Brooke Palmer  is a 57 y.o. female, w Down syndrome, ptsd, seizure,  DVT, Sacral decub apparently sent from group home to ER for fever of 105. Pt is unable to provide any history due to dementia.    Assessment & Plan: Principal Problem:   Sepsis ([place]) Active Problems:   Fever, unspecified   Tachycardia   Hypernatremia   Anemia   Pressure injury of skin   Sepsis due to bilateral pneumonia -Patient met criteria with fever, tachycardia, lactic acidosis and leukocytosis -patient had sacral pressure injury with skin breakdown however that does not look deep nor infected at this point -Influenza was negative, respiratory virus panel negative -Chest x-ray with increased interstitial opacities at the bases, appearing stable however cannot fully exclude acute infection / PNA -Patient started on vancomycin and Zosyn, continue with Zosyn alone -Sepsis physiology resolved -No clear source so far, skin does not look infected, urinalysis clear, will go ahead and treat this as if it were pneumonia given abnormal chest x-ray and reported respiratory symptoms on presentation -Her sepsis physiology has resolved, she appears back to baseline, if cultures remain negative within 24 hours may be able to be discharged back to group home on an oral antibiotic  Hypernatremia -Likely in the setting of poor p.o. intake, sodium has now normalized with fluids -Repeat blood work this morning pending  Anemia of chronic disease -Hemoglobin stable, repeat blood work this morning pending  Sacral decubitus wound -Appreciate wound care consultation, continue local care  History of DVT -Continue Xarelto  Seizure disorder -Continue Keppra   DVT prophylaxis: Xarelto Code Status: Full code Family Communication: No family at bedside Disposition Plan: Back to  group home likely within 24 hours  Consultants:   None  Procedures:   2D echo Study Conclusions - Left ventricle: The cavity size was normal. Systolic function was normal. The estimated ejection fraction was in the range of 55% to 60%. Wall motion was normal; there were no regional wall motion abnormalities. Left ventricular diastolic function parameters were normal. - Pericardium, extracardiac: A trivial pericardial effusion was identified.   Antimicrobials:   Vancomycin 1/24   Zosyn 1/24 >>  Subjective: -Mumbles something unintelligibly when asked a question  Objective: Vitals:   03/09/17 0534 03/09/17 1328 03/09/17 2116 03/10/17 0647  BP:  112/65 122/70 (!) 140/96  Pulse:  67 87 86  Resp:  18 14 15   Temp:  (!) 97 F (36.1 C) (!) 97.4 F (36.3 C) (!) 97.5 F (36.4 C)  TempSrc:  Oral Axillary Oral  SpO2:  96% 95% 98%  Weight: 58.1 kg (128 lb)     Height:        Intake/Output Summary (Last 24 hours) at 03/10/2017 1115 Last data filed at 03/10/2017 0925 Gross per 24 hour  Intake 590 ml  Output -  Net 590 ml   Filed Weights   03/07/17 2004 03/08/17 0622 03/09/17 0534  Weight: 61.9 kg (136 lb 7.4 oz) 61.9 kg (136 lb 7.4 oz) 58.1 kg (128 lb)    Examination:  Constitutional: No distress, smiling Eyes: No scleral icterus, lids and conjunctivae normal ENMT: Moist mucous membranes Respiratory: Clear to auscultation, no wheezing, no crackles Cardiovascular: Regular rate and rhythm, no murmurs, no edema Abdomen: Soft, nontender, nondistended, positive bowel sounds Neurologic: Appears nonfocal as she moves all 4 extremities spontaneously.  She  does not follow commands.   Data Reviewed: I have independently reviewed following labs and imaging studies   CBC: Recent Labs  Lab 03/07/17 1708 03/08/17 0457  WBC 14.9* 21.3*  NEUTROABS 13.2*  --   HGB 11.5* 10.0*  HCT 35.0* 31.4*  MCV 102.6* 103.0*  PLT 387 629   Basic Metabolic Panel: Recent Labs  Lab  03/07/17 1708 03/08/17 0457  NA 147* 145  K 3.9 3.6  CL 114* 114*  CO2 22 23  GLUCOSE 103* 129*  BUN 15 13  CREATININE 0.72 0.68  CALCIUM 8.3* 8.2*   GFR: Estimated Creatinine Clearance: 64.2 mL/min (by C-G formula based on SCr of 0.68 mg/dL). Liver Function Tests: Recent Labs  Lab 03/07/17 1708 03/08/17 0457  AST 27 18  ALT 17 15  ALKPHOS 150* 123  BILITOT 0.7 0.2*  PROT 7.2 6.3*  ALBUMIN 2.3* 1.9*   No results for input(s): LIPASE, AMYLASE in the last 168 hours. No results for input(s): AMMONIA in the last 168 hours. Coagulation Profile: No results for input(s): INR, PROTIME in the last 168 hours. Cardiac Enzymes: Recent Labs  Lab 03/08/17 0457  TROPONINI <0.03   BNP (last 3 results) No results for input(s): PROBNP in the last 8760 hours. HbA1C: No results for input(s): HGBA1C in the last 72 hours. CBG: No results for input(s): GLUCAP in the last 168 hours. Lipid Profile: No results for input(s): CHOL, HDL, LDLCALC, TRIG, CHOLHDL, LDLDIRECT in the last 72 hours. Thyroid Function Tests: Recent Labs    03/08/17 0457  TSH 2.053   Anemia Panel: No results for input(s): VITAMINB12, FOLATE, FERRITIN, TIBC, IRON, RETICCTPCT in the last 72 hours. Urine analysis:    Component Value Date/Time   COLORURINE YELLOW 03/07/2017 1708   APPEARANCEUR CLEAR 03/07/2017 1708   LABSPEC 1.021 03/07/2017 1708   PHURINE 6.0 03/07/2017 1708   GLUCOSEU NEGATIVE 03/07/2017 1708   HGBUR NEGATIVE 03/07/2017 1708   BILIRUBINUR NEGATIVE 03/07/2017 1708   KETONESUR NEGATIVE 03/07/2017 1708   PROTEINUR NEGATIVE 03/07/2017 1708   NITRITE NEGATIVE 03/07/2017 1708   LEUKOCYTESUR NEGATIVE 03/07/2017 1708   Sepsis Labs: Invalid input(s): PROCALCITONIN, LACTICIDVEN  Recent Results (from the past 240 hour(s))  Blood Culture (routine x 2)     Status: None (Preliminary result)   Collection Time: 03/07/17  4:42 PM  Result Value Ref Range Status   Specimen Description BLOOD  Final    Special Requests   Final    BOTTLES DRAWN AEROBIC ONLY Blood Culture adequate volume LEFT EJ   Culture   Final    NO GROWTH 2 DAYS Performed at [facility], [address] [place], [place] [address]    Report Status PENDING  Incomplete  Blood Culture (routine x 2)     Status: None (Preliminary result)   Collection Time: 03/07/17  5:09 PM  Result Value Ref Range Status   Specimen Description BLOOD RIGHT WRIST  Final   Special Requests IN PEDIATRIC BOTTLE Blood Culture adequate volume  Final   Culture   Final    NO GROWTH 2 DAYS Performed at [facility], [place] [address], [place], [place] [address]    Report Status PENDING  Incomplete  Respiratory Panel by PCR     Status: None   Collection Time: 03/08/17  2:01 AM  Result Value Ref Range Status   Adenovirus NOT DETECTED NOT DETECTED Final   Coronavirus 229E NOT DETECTED NOT DETECTED Final   Coronavirus HKU1 NOT DETECTED NOT DETECTED  Final   Coronavirus NL63 NOT DETECTED NOT DETECTED Final   Coronavirus OC43 NOT DETECTED NOT DETECTED Final   Metapneumovirus NOT DETECTED NOT DETECTED Final   Rhinovirus / Enterovirus NOT DETECTED NOT DETECTED Final   Influenza A NOT DETECTED NOT DETECTED Final   Influenza B NOT DETECTED NOT DETECTED Final   Parainfluenza Virus 1 NOT DETECTED NOT DETECTED Final   Parainfluenza Virus 2 NOT DETECTED NOT DETECTED Final   Parainfluenza Virus 3 NOT DETECTED NOT DETECTED Final   Parainfluenza Virus 4 NOT DETECTED NOT DETECTED Final   Respiratory Syncytial Virus NOT DETECTED NOT DETECTED Final   Bordetella pertussis NOT DETECTED NOT DETECTED Final   Chlamydophila pneumoniae NOT DETECTED NOT DETECTED Final   Mycoplasma pneumoniae NOT DETECTED NOT DETECTED Final  MRSA PCR Screening     Status: None   Collection Time: 03/08/17  2:01 AM  Result Value Ref Range Status   MRSA by PCR NEGATIVE NEGATIVE Final    Comment:        The GeneXpert MRSA Assay (FDA approved for NASAL specimens only), is one  component of a comprehensive MRSA colonization surveillance program. It is not intended to diagnose MRSA infection nor to guide or monitor treatment for MRSA infections.       Radiology Studies: No results found.   Scheduled Meds: . collagenase   Topical Daily  . docusate  100 mg Oral Daily  . feeding supplement (PRO-STAT SUGAR FREE 64)  30 mL Oral BID  . fludrocortisone  0.1 mg Oral Daily  . lamoTRIgine  100 mg Oral Daily  . levETIRAcetam  1,250 mg Oral BID  . levothyroxine  100 mcg Oral Daily  . memantine  5 mg Oral Daily  . montelukast  10 mg Oral Daily  . MUSCLE RUB  1 application Topical BID  . pantoprazole sodium  40 mg Oral Daily  . rivaroxaban  20 mg Oral Q supper   Continuous Infusions: . piperacillin-tazobactam (ZOSYN)  IV 3.375 g (03/10/17 0816)     Marzetta Board, MD, PhD [facility] Pager [phone]  If 7PM-7AM, please contact night-coverage [email or web address] Password [facility] 03/10/2017, 11:15 AM

## 2017-03-10 NOTE — Plan of Care (Signed)
  Safety: Ability to remain free from injury will improve 03/10/2017 2254 - Progressing by Antionette CharPeng, Ailany Koren P, RN

## 2017-03-11 ENCOUNTER — Ambulatory Visit: Payer: Medicare Other | Admitting: Physician Assistant

## 2017-03-11 LAB — BASIC METABOLIC PANEL
ANION GAP: 6 (ref 5–15)
BUN: 14 mg/dL (ref 6–20)
CHLORIDE: 113 mmol/L — AB (ref 101–111)
CO2: 25 mmol/L (ref 22–32)
CREATININE: 0.63 mg/dL (ref 0.44–1.00)
Calcium: 8.1 mg/dL — ABNORMAL LOW (ref 8.9–10.3)
GFR calc non Af Amer: >60 mL/min (ref >60)
Glucose, Bld: 97 mg/dL (ref 65–99)
POTASSIUM: 3 mmol/L — AB (ref 3.5–5.1)
Sodium: 144 mmol/L (ref 135–145)

## 2017-03-11 MED ORDER — POTASSIUM CHLORIDE 20 MEQ/15ML (10%) PO SOLN
40.0000 meq | ORAL | Status: AC
  Administered 2017-03-11 (×2): 40 meq via ORAL
  Filled 2017-03-11 (×2): qty 30

## 2017-03-11 MED ORDER — DOXYCYCLINE HYCLATE 100 MG PO CAPS: 100.0000 mg | ORAL_CAPSULE | Freq: Two times a day (BID) | ORAL | 0 refills | Status: DC

## 2017-03-11 NOTE — Discharge Summary (Addendum)
Physician Discharge Summary  Brooke Palmer HGD:[id number] DOB: 05-13-1960 DOA: 03/07/2017  PCP: Trinidad Curet  Admit date: 03/07/2017 Discharge date: 03/11/2017  Admitted From: group home Disposition:  group home  Recommendations for Outpatient Follow-up:  1. Follow up with PCP in 1-2 weeks 2. Continue Doxycyline for 5 more days  Home Health: none  Equipment/Devices: none   Discharge Condition: stable CODE STATUS: Full code Diet recommendation: regular  HPI: Per Dr. Maudie Mercury,  Brooke Palmer  is a 57 y.o. female,  w dementia, ptsd, seizure,  DVT,  Sacral decub apparently sent from group home to ER for fever of 105 . Pt is unable to provide any history due to dementia. In ED,  Pt noted to be febrile and tachycardic and suspected to be septic.  Source of sepsis unclear.  Wbc 14.9, Hgb 11.5, Plt 387   Na 147 , K 3.9, Glucose 103,  Bun 15, Creatinne 0.72 Ast 27, Alt 17, Alk phos 150, T. Bili 0.7 Alb 2.3, Influenza negative CXR IMPRESSION: No acute cardiopulmonary abnormality compared to 02/02/2017; there is increased interstitial opacity at both lung bases, but this appears stable since December favoring chronic lung disease over acute infection.  Hospital Course: Sepsis due to bilateral pneumonia -Patient met criteria with fever, tachycardia, lactic acidosis and leukocytosis. Patient had sacral pressure injury with skin breakdown however that does not look deep nor infected at this point. Influenza was negative, respiratory virus panel negative. Chest x-ray with increased interstitial opacities at the bases, appearing stable however cannot fully exclude acute infection / PNA. She was started on vancomycin and Zosyn, once cultures remain negative her vancomycin was discontinued and she was continued with Zosyn alone.  Her sepsis physiology resolved, cultures have remained negative, she will be transitioned to oral doxycycline to have pulmonary and skin coverage for 5 additional days.  She will be  discharged back to group home in stable condition.  Continue wound care Hypernatremia -Likely in the setting of poor p.o. intake, sodium has now normalized with fluids Anemia of chronic disease -Hemoglobin stable Sacral decubitus wound -Appreciate wound care consultation, continue local care History of DVT -Continue Xarelto Seizure disorder -Continue Keppra Hypokalemia -suspect poor p.o. intake, repleted prior to discharge, repeat a BMP in 3-4 days   Discharge Diagnoses:  Principal Problem:   Sepsis ([place]) Active Problems:   Fever, unspecified   Tachycardia   Hypernatremia   Anemia   Pressure injury of skin   Discharge Instructions   Allergies as of 03/11/2017      Reactions   Vancomycin Hives      Medication List    STOP taking these medications   levofloxacin 750 MG tablet Commonly known as:  LEVAQUIN     TAKE these medications   acetic acid 2 % otic solution Place 4 drops into both ears 2 (two) times daily.   acetic acid-hydrocortisone OTIC solution Commonly known as:  VOSOL-HC Place 4 drops into both ears 3 (three) times daily as needed (ear infection prevention).   carbamide peroxide 6.5 % OTIC solution Commonly known as:  DEBROX Place 5 drops into both ears 2 (two) times daily as needed (ear infection prevention).   coal tar 0.5 % shampoo Commonly known as:  NEUTROGENA T-GEL Apply 1 application topically at bedtime as needed (scalp, dandruff).   diazepam 10 MG Gel Commonly known as:  DIASTAT ACUDIAL Place 10 mg rectally daily as needed for seizure.   docusate sodium 100 MG capsule Commonly known as:  COLACE  Take 100 mg by mouth daily.   doxycycline 100 MG capsule Commonly known as:  VIBRAMYCIN Take 1 capsule (100 mg total) by mouth 2 (two) times daily.   erythromycin ophthalmic ointment Place a 1/2 inch ribbon of ointment into the lower eyelid QID for one week.   fludrocortisone 0.1 MG tablet Commonly known as:  FLORINEF Take 0.1 mg by mouth  daily.   ibuprofen 800 MG tablet Commonly known as:  ADVIL,MOTRIN Take 800 mg by mouth every 6 (six) hours as needed for moderate pain.   lamoTRIgine 100 MG tablet Commonly known as:  LAMICTAL Take 100 mg by mouth daily.   levETIRAcetam 500 MG tablet Commonly known as:  KEPPRA Take 1,250 mg by mouth 2 (two) times daily.   levETIRAcetam 1000 MG tablet Commonly known as:  KEPPRA Take 1 tablet (1,000 mg total) by mouth 2 (two) times daily.   levETIRAcetam 250 MG tablet Commonly known as:  KEPPRA   levothyroxine 100 MCG tablet Commonly known as:  SYNTHROID, LEVOTHROID Take 100 mcg by mouth daily.   LORazepam 0.5 MG tablet Commonly known as:  ATIVAN Take 0.5 mg by mouth 2 (two) times daily as needed for seizure.   Melatonin 3 MG Tabs Take 3 mg by mouth at bedtime.   memantine 5 MG tablet Commonly known as:  NAMENDA Take 5 mg by mouth daily.   montelukast 10 MG tablet Commonly known as:  SINGULAIR Take 10 mg by mouth daily.   omeprazole 20 MG capsule Commonly known as:  PRILOSEC Take 20 mg by mouth daily.   OSCAL 500/200 D-3 PO Take 1 tablet by mouth 2 (two) times daily.   phenylephrine-shark liver oil-mineral oil-petrolatum 0.25-3-14-71.9 % rectal ointment Commonly known as:  PREPARATION H Place 1 application rectally 2 (two) times daily as needed for hemorrhoids.   potassium chloride SA 20 MEQ tablet Commonly known as:  K-DUR,KLOR-CON Take 1 tablet (20 mEq total) by mouth daily.   PREPARATION H 5-14.4 % Crea Generic drug:  Lidocaine-Glycerin Place 1 application rectally daily as needed (hemorrhoids).   RISAMINE 0.44-20.625 % Oint Generic drug:  Menthol-Zinc Oxide Apply 1 application topically 2 (two) times daily.   rivaroxaban 20 MG Tabs tablet Commonly known as:  XARELTO Take 20 mg by mouth daily with supper.   Vitamin D3 2000 units Tabs Take 2,000 Units by mouth daily after breakfast.       Consultations:  None   Procedures/Studies: 2D  echo  Study Conclusions - Left ventricle: The cavity size was normal. Systolic function was normal. The estimated ejection fraction was in the range of 55% to 60%. Wall motion was normal; there were no regional wall motion abnormalities. Left ventricular diastolic function parameters were normal. - Pericardium, extracardiac: A trivial pericardial effusion was identified.  Dg Chest 2 View  Result Date: 03/07/2017 CLINICAL DATA:  57 year old female with altered mental status and fever. EXAM: CHEST  2 VIEW COMPARISON:  02/18/2017 chest radiographs and earlier. FINDINGS: Similar to a study on 02/02/2017 the patient's arms are not raised on the lateral view, limiting its utility. Stable lung volumes since that time. Mediastinal contours are stable and within normal limits. No pneumothorax, pulmonary edema, pleural effusion or consolidation. Mild nonspecific coarse and streaky opacity at both lung bases is noted on the frontal view, but appears stable since 02/02/2017. negative visible bowel gas pattern. No acute osseous abnormality identified. IMPRESSION: No acute cardiopulmonary abnormality compared to 02/02/2017; there is increased interstitial opacity at both lung bases, but this  appears stable since December favoring chronic lung disease over acute infection. Electronically Signed   By: Genevie Ann M.D.   On: 03/07/2017 16:39   Dg Chest Port 1 View  Result Date: 02/18/2017 CLINICAL DATA:  57 year old female with altered mental status and fever. Recent pneumonia. EXAM: PORTABLE CHEST 1 VIEW COMPARISON:  02/07/2017 portable chest. FINDINGS: Portable AP semi upright view at 1706 hours. Stable lung volumes. Patchy left lung base opacity is stable. Similar patchy medial right lung base opacity is improved. No pneumothorax, pulmonary edema, pleural effusion or areas of worsening ventilation. Stable cardiac size and mediastinal contours. Negative visible bowel gas pattern. IMPRESSION: Stable to mildly improved patchy  lung base opacity since 02/07/2017 which is nonspecific, and might be scarring or atelectasis rather than infection. No new cardiopulmonary abnormality. Electronically Signed   By: Genevie Ann M.D.   On: 02/18/2017 17:30      Subjective: Non verbal, comfortable, no distress   Discharge Exam: Vitals:   03/10/17 2025 03/11/17 0506  BP: 138/68 109/74  Pulse: 95 73  Resp: 16 16  Temp: 98.2 F (36.8 C) 97.9 F (36.6 C)  SpO2: 93% 96%    General: Pt is alert, awake, not in acute distress Cardiovascular: RRR, S1/S2 +, no rubs, no gallops Respiratory: CTA bilaterally, no wheezing, no rhonchi Abdominal: Soft, NT, ND, bowel sounds + Extremities: no edema, no cyanosis    The results of significant diagnostics from this hospitalization (including imaging, microbiology, ancillary and laboratory) are listed below for reference.     Microbiology: Recent Results (from the past 240 hour(s))  Blood Culture (routine x 2)     Status: None (Preliminary result)   Collection Time: 03/07/17  4:42 PM  Result Value Ref Range Status   Specimen Description BLOOD  Final   Special Requests   Final    BOTTLES DRAWN AEROBIC ONLY Blood Culture adequate volume LEFT EJ   Culture   Final    NO GROWTH 3 DAYS Performed at [facility], [address] [place], [place] [address]    Report Status PENDING  Incomplete  Blood Culture (routine x 2)     Status: None (Preliminary result)   Collection Time: 03/07/17  5:09 PM  Result Value Ref Range Status   Specimen Description BLOOD RIGHT WRIST  Final   Special Requests IN PEDIATRIC BOTTLE Blood Culture adequate volume  Final   Culture   Final    NO GROWTH 3 DAYS Performed at [facility], [place] [address], [place], [place] [address]    Report Status PENDING  Incomplete  Respiratory Panel by PCR     Status: None   Collection Time: 03/08/17  2:01 AM  Result Value Ref Range Status   Adenovirus NOT DETECTED NOT DETECTED Final   Coronavirus 229E NOT  DETECTED NOT DETECTED Final   Coronavirus HKU1 NOT DETECTED NOT DETECTED Final   Coronavirus NL63 NOT DETECTED NOT DETECTED Final   Coronavirus OC43 NOT DETECTED NOT DETECTED Final   Metapneumovirus NOT DETECTED NOT DETECTED Final   Rhinovirus / Enterovirus NOT DETECTED NOT DETECTED Final   Influenza A NOT DETECTED NOT DETECTED Final   Influenza B NOT DETECTED NOT DETECTED Final   Parainfluenza Virus 1 NOT DETECTED NOT DETECTED Final   Parainfluenza Virus 2 NOT DETECTED NOT DETECTED Final   Parainfluenza Virus 3 NOT DETECTED NOT DETECTED Final   Parainfluenza Virus 4 NOT DETECTED NOT DETECTED Final   Respiratory Syncytial Virus NOT DETECTED NOT DETECTED  Final   Bordetella pertussis NOT DETECTED NOT DETECTED Final   Chlamydophila pneumoniae NOT DETECTED NOT DETECTED Final   Mycoplasma pneumoniae NOT DETECTED NOT DETECTED Final  MRSA PCR Screening     Status: None   Collection Time: 03/08/17  2:01 AM  Result Value Ref Range Status   MRSA by PCR NEGATIVE NEGATIVE Final    Comment:        The GeneXpert MRSA Assay (FDA approved for NASAL specimens only), is one component of a comprehensive MRSA colonization surveillance program. It is not intended to diagnose MRSA infection nor to guide or monitor treatment for MRSA infections.      Labs: BNP (last 3 results) Recent Labs    02/02/17 2131  BNP 67.6   Basic Metabolic Panel: Recent Labs  Lab 03/07/17 1708 03/08/17 0457 03/10/17 1048 03/11/17 0551  NA 147* 145 143 144  K 3.9 3.6 2.8* 3.0*  CL 114* 114* 112* 113*  CO2 _0 GLUCOSE 103* 129* 111* 97  BUN _1 CREATININE 0.72 0.68 0.72 0.63  CALCIUM 8.3* 8.2* 8.0* 8.1*   Liver Function Tests: Recent Labs  Lab 03/07/17 1708 03/08/17 0457 03/10/17 1048  AST _2 ALT _3 ALKPHOS 150* 123 123  BILITOT 0.7 0.2* 0.3  PROT 7.2 6.3* 6.7  ALBUMIN 2.3* 1.9* 2.3*   No results for input(s): LIPASE, AMYLASE in the last 168 hours. No results for  input(s): AMMONIA in the last 168 hours. CBC: Recent Labs  Lab 03/07/17 1708 03/08/17 0457 03/10/17 1048  WBC 14.9* 21.3* 7.9  NEUTROABS 13.2*  --   --   HGB 11.5* 10.0* 11.5*  HCT 35.0* 31.4* 34.7*  MCV 102.6* 103.0* 101.2*  PLT 387 312 335   Cardiac Enzymes: Recent Labs  Lab 03/08/17 0457  TROPONINI <0.03   BNP: Invalid input(s): POCBNP CBG: No results for input(s): GLUCAP in the last 168 hours. D-Dimer No results for input(s): DDIMER in the last 72 hours. Hgb A1c No results for input(s): HGBA1C in the last 72 hours. Lipid Profile No results for input(s): CHOL, HDL, LDLCALC, TRIG, CHOLHDL, LDLDIRECT in the last 72 hours. Thyroid function studies No results for input(s): TSH, T4TOTAL, T3FREE, THYROIDAB in the last 72 hours.  Invalid input(s): FREET3 Anemia work up No results for input(s): VITAMINB12, FOLATE, FERRITIN, TIBC, IRON, RETICCTPCT in the last 72 hours. Urinalysis    Component Value Date/Time   COLORURINE YELLOW 03/07/2017 1708   APPEARANCEUR CLEAR 03/07/2017 1708   LABSPEC 1.021 03/07/2017 1708   PHURINE 6.0 03/07/2017 1708   GLUCOSEU NEGATIVE 03/07/2017 1708   HGBUR NEGATIVE 03/07/2017 1708   BILIRUBINUR NEGATIVE 03/07/2017 1708   KETONESUR NEGATIVE 03/07/2017 1708   PROTEINUR NEGATIVE 03/07/2017 1708   NITRITE NEGATIVE 03/07/2017 1708   LEUKOCYTESUR NEGATIVE 03/07/2017 1708   Sepsis Labs Invalid input(s): PROCALCITONIN,  WBC,  LACTICIDVEN   Time coordinating discharge: 38 minutes  SIGNED:  Marzetta Board, MD  [facility] 03/11/2017, 10:23 AM Pager [phone]  If 7PM-7AM, please contact night-coverage [email or web address] Password [email or web address]

## 2017-03-11 NOTE — Care Management Important Message (Signed)
Important Message  Patient Details  Name: Brooke Palmer MRN: [id number] Date of Birth: 12/22/1960   Medicare Important Message Given:  Yes    Caren MacadamFuller, Charlsey Moragne 03/11/2017, 12:05 PMImportant Message  Patient Details  Name: Brooke Palmer MRN: [id number] Date of Birth: 05/04/1960   Medicare Important Message Given:  Yes    Caren MacadamFuller, Tanzie Rothschild 03/11/2017, 12:05 PM

## 2017-03-11 NOTE — Progress Notes (Signed)
Report called to Bjorn Loserhonda, Charity fundraiserN at [facility].  [facility] transported patient back to facility.

## 2017-03-11 NOTE — Progress Notes (Signed)
LCSW following for facility placement.  [facility] is from [facility].   LCSW confirmed return with facility.  LCSW faxed dc documents to facility.   Patient will transport by [facility].  RN report #: [id number]  Coralyn HellingBernette Taryn Shellhammer, Norberta KeensLSCW [place] CSW [phone]

## 2017-03-12 LAB — CULTURE, BLOOD (ROUTINE X 2)
CULTURE: NO GROWTH
CULTURE: NO GROWTH
SPECIAL REQUESTS: ADEQUATE

## 2017-03-18 ENCOUNTER — Encounter: Payer: Medicare Other | Attending: Physician Assistant | Admitting: Physician Assistant

## 2017-03-18 DIAGNOSIS — E11621 Type 2 diabetes mellitus with foot ulcer: Secondary | ICD-10-CM | POA: Diagnosis not present

## 2017-03-18 DIAGNOSIS — L89150 Pressure ulcer of sacral region, unstageable: Secondary | ICD-10-CM | POA: Diagnosis not present

## 2017-03-18 DIAGNOSIS — R1312 Dysphagia, oropharyngeal phase: Secondary | ICD-10-CM | POA: Insufficient documentation

## 2017-03-18 DIAGNOSIS — E1151 Type 2 diabetes mellitus with diabetic peripheral angiopathy without gangrene: Secondary | ICD-10-CM | POA: Insufficient documentation

## 2017-03-18 DIAGNOSIS — Z86718 Personal history of other venous thrombosis and embolism: Secondary | ICD-10-CM | POA: Diagnosis not present

## 2017-03-18 DIAGNOSIS — I120 Hypertensive chronic kidney disease with stage 5 chronic kidney disease or end stage renal disease: Secondary | ICD-10-CM | POA: Diagnosis not present

## 2017-03-18 DIAGNOSIS — F0390 Unspecified dementia without behavioral disturbance: Secondary | ICD-10-CM | POA: Diagnosis not present

## 2017-03-18 DIAGNOSIS — N179 Acute kidney failure, unspecified: Secondary | ICD-10-CM | POA: Diagnosis not present

## 2017-03-18 DIAGNOSIS — L97529 Non-pressure chronic ulcer of other part of left foot with unspecified severity: Secondary | ICD-10-CM | POA: Insufficient documentation

## 2017-03-18 DIAGNOSIS — Z794 Long term (current) use of insulin: Secondary | ICD-10-CM | POA: Insufficient documentation

## 2017-03-18 DIAGNOSIS — E1122 Type 2 diabetes mellitus with diabetic chronic kidney disease: Secondary | ICD-10-CM | POA: Diagnosis not present

## 2017-03-18 DIAGNOSIS — F4310 Post-traumatic stress disorder, unspecified: Secondary | ICD-10-CM | POA: Diagnosis not present

## 2017-03-18 DIAGNOSIS — R41841 Cognitive communication deficit: Secondary | ICD-10-CM | POA: Diagnosis not present

## 2017-03-18 DIAGNOSIS — N186 End stage renal disease: Secondary | ICD-10-CM | POA: Insufficient documentation

## 2017-03-18 DIAGNOSIS — E039 Hypothyroidism, unspecified: Secondary | ICD-10-CM | POA: Diagnosis not present

## 2017-03-19 NOTE — Progress Notes (Addendum)
Brooke Palmer ([id number]) Visit Report for 03/18/2017 Chief Complaint Document Details Patient Name: Brooke Palmer, Brooke Palmer Date of Service: 03/18/2017 10:15 AM Medical Record Number: [id number] Patient Account Number: [id number] Date of Birth/Sex: [birth year] (57 y.o. Female) Treating RN: Renne Crigler Primary Care Provider: Katina Dung Other Clinician: Referring Provider: Katina Dung Treating Provider/Extender: Linwood Dibbles, Shaili Donalson Weeks in Treatment: 5 Information Obtained from: Patient Chief Complaint Left second toe ulcer and right heel DTI Electronic Signature(s) Signed: 03/18/2017 4:49:26 PM By: Lenda Kelp PA-C Entered By: Lenda Kelp on 03/18/2017 10:43:42 Brooke Palmer ([id number]) -------------------------------------------------------------------------------- Debridement Details Patient Name: Brooke Palmer Date of Service: 03/18/2017 10:15 AM Medical Record Number: [id number] Patient Account Number: [id number] Date of Birth/Sex: [birth year] (56 y.o. Female) Treating RN: Renne Crigler Primary Care Provider: Katina Dung Other Clinician: Referring Provider: Katina Dung Treating Provider/Extender: Linwood Dibbles, Aser Nylund Weeks in Treatment: 5 Debridement Performed for Wound #3 Midline Sacrum Assessment: Performed By: Physician STONE III, Lehua Flores E., PA-C Debridement: Chemical/enzymatic Debridement Description: Non-Selective Pre-procedure Verification/Time Yes - 11:15 Out Taken: Start Time: 11:15 End Time: 11:16 Procedural Pain: 0 Post Procedural Pain: 0 Response to Treatment: Procedure was tolerated well Post Debridement Measurements of Total Wound Length: (cm) 6.2 Stage: Category/Stage IV Width: (cm) 12.2 Depth: (cm) 0.9 Volume: (cm) 53.467 Character of Wound/Ulcer Post Stable Debridement: Post Procedure Diagnosis Same as Pre-procedure Electronic Signature(s) Signed: 03/19/2017 8:01:51 AM By: Renne Crigler Signed: 03/19/2017 1:41:25 PM By: Lenda Kelp  PA-C Entered By: Renne Crigler on 03/19/2017 08:01:51 Brooke Palmer ([id number]) -------------------------------------------------------------------------------- HPI Details Patient Name: Brooke Palmer Date of Service: 03/18/2017 10:15 AM Medical Record Number: [id number] Patient Account Number: [id number] Date of Birth/Sex: Aug 23, 1960 (57 y.o. 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 The good news is the ulcerations on the left lower extremity appear to be doing well with one of the areas over the great toe region actually healed. In regard to the left second toe it appears this to may be healed although I'm not 100% confident in calling this so just based on this initial evaluation there does not appear to be any drainage however. Subsequently there is also a left heel deep tissue injury which appears to be minimal and does not appear to hurt her at this point which is good news. Unfortunately secondary to her mental status she is unable to rate or describe any discomfort she may be having. She is a resident at a group home. However with the severity of her impairment including the need for a Hoyer lift in order to move her around I am wondering if she should not actually be a skilled patient. No fevers, chills, nausea, or vomiting noted at this time. Patient did previously have  pneumonia as well as acute kidney failure when she was most recently in the hospital prior to discharge and then subsequent follow-up with [place]. 02/25/17 the good news is the wounds that I was previously evaluating patient forcing to be doing better and have healed. Unfortunately she has a blister on the left heel and  what appears to be a blood blister on the right heel both immediately that are new neither appear to be open in both appear to be dry and intact for the time being. There is no evidence of infection at this point. Patient does not seem to have any significant pain which is good news. 03/18/17 on evaluation today patient appears to be doing decently well in regard to her feet where her blisters seem to be drying out and nothing seems to be worsening. Nonetheless unfortunately she has a quite significant ulceration on the sacral region which is newly evaluated today. According to patient's caregiver the patient does spend quite a significant amount of time sitting up in her chair unfortunately. I think this is going to have to change. She does seem to have discomfort in regard to the sacral wound where she doesn't seem to respond as much in regard to her feet and deep tissue injuries noted there. Electronic Signature(s) Signed: 03/18/2017 4:49:26 PM By: Lenda Kelp PA-C Entered By: Lenda Kelp on 03/18/2017 13:53:08 Brooke Palmer, Brooke Palmer ([id number]) -------------------------------------------------------------------------------- Physical Exam Details Patient Name: Brooke Palmer Date of Service: 03/18/2017 10:15 AM Medical Record Number: [id number] Patient Account Number: [id number] Date of Birth/Sex: 12-01-1960 (57 y.o. Female) Treating RN: Renne Crigler Primary Care Provider: Katina Dung Other Clinician: Referring Provider: Katina Dung Treating Provider/Extender: STONE III, Karla Pavone Weeks in Treatment: 5 Constitutional Chronically ill appearing but in no apparent acute  distress. Respiratory normal breathing without difficulty. clear to auscultation bilaterally. Cardiovascular regular rate and rhythm with normal S1, S2. Psychiatric Patient is not able to cooperate in decision making regarding care. Patient has dementia. patient is confused. Notes Patient's deep tissue injury is on the feet seem to be loosening up and improving which is excellent news. With that being said the ulceration on the sacral region shows a significant amount of slough in the central portion of the wound and this is very close to bone although there is no bone exposure at this point that can be determined. This is an insatiable ulcer. With that being said there is some area of granulation surrounding patient does have a lot of discomfort at the site. There is definite pressure injury and this needs to be addressed by nursing/care staff. Electronic Signature(s) Signed: 03/18/2017 4:49:26 PM By: Lenda Kelp PA-C Entered By: Lenda Kelp on 03/18/2017 13:54:21 Brooke Palmer, Brooke Palmer ([id number]) -------------------------------------------------------------------------------- Physician Orders Details Patient Name: Brooke Palmer Date of Service: 03/18/2017 10:15 AM Medical Record Number: [id number] Patient Account Number: [id number] Date of Birth/Sex: April 20, 1960 (57 y.o. Female) Treating RN: Renne Crigler Primary Care Provider: Katina Dung Other Clinician: Referring Provider: Katina Dung Treating Provider/Extender: Linwood Dibbles, Alyiah Ulloa Weeks in Treatment: 5 Verbal / Phone Orders: No Diagnosis Coding ICD-10 Coding Code Description L98.8 Other specified disorders of the skin and subcutaneous tissue R41.841 Cognitive communication deficit R13.13 Dysphagia, pharyngeal phase I82.409 Acute embolism and thrombosis of unspecified deep veins of unspecified lower extremity G40.909 Epilepsy, unspecified, not intractable, without status epilepticus Anesthetic (add to Medication List) o  Topical Lidocaine 4% cream applied to wound bed prior to debridement (In Clinic Only). Skin Barriers/Peri-Wound Care o Other: - left heel-betadine right heel - betadine Primary Wound Dressing Wound #3 Midline Sacrum o Santyl Ointment - apply nickel thick to white areas on mid sacral wound, then apply a saline soaked gauze over the santyl, then silvercell on top the wound to cover the rest of the wound area. Secondary Dressing Wound #3 Midline Sacrum o ABD pad - to  cover the dressing applied and then secure with Tegaderm dressings to seal and waterproof the wound from urine and feces. The tegaderm should cover the entire dressing to waterproof. Dressing Change Frequency Wound #3 Midline Sacrum o Change dressing every day. Follow-up Appointments Wound #3 Midline Sacrum o Return Appointment in 1 week. Off-Loading Wound #3 Midline Sacrum o Mattress - alternating air mattress to patients weight o Turn and reposition every 2 hours - patient needs to be offloading of the sacral area and does not need to sit up in chair/recliner more than 2 hours at a time. Reposition in the bed every 2 hours as well to offload patients sacral area. She should not be in any 1 position for longer than 2 hours in general. o Other: - Prevalon cushioned boots bilateral with heel cups and gauze wrap Electronic Signature(s) Brooke Palmer, Brooke Palmer ([id number]) Signed: 03/18/2017 4:43:00 PM By: Renne Crigler Signed: 03/18/2017 4:49:26 PM By: Lenda Kelp PA-C Entered By: Renne Crigler on 03/18/2017 11:25:25 Brooke Palmer ([id number]) -------------------------------------------------------------------------------- Problem List Details Patient Name: Brooke Palmer Date of Service: 03/18/2017 10:15 AM Medical Record Number: [id number] Patient Account Number: [id number] Date of Birth/Sex: 1960-07-10 (57 y.o. Female) Treating RN: Renne Crigler Primary Care Provider: Katina Dung Other  Clinician: Referring Provider: Katina Dung Treating Provider/Extender: Linwood Dibbles, Selwyn Reason Weeks in Treatment: 5 Active Problems ICD-10 Encounter Code Description Active Date Diagnosis L98.8 Other specified disorders of the skin and subcutaneous tissue 02/11/2017 Yes R41.841 Cognitive communication deficit 02/11/2017 Yes R13.13 Dysphagia, pharyngeal phase 02/11/2017 Yes I82.409 Acute embolism and thrombosis of unspecified deep veins of 02/11/2017 Yes unspecified lower extremity G40.909 Epilepsy, unspecified, not intractable, without status epilepticus 02/11/2017 Yes Inactive Problems Resolved Problems Electronic Signature(s) Signed: 03/18/2017 4:49:26 PM By: Lenda Kelp PA-C Entered By: Lenda Kelp on 03/18/2017 10:43:33 Brooke Palmer ([id number]) -------------------------------------------------------------------------------- Progress Note Details Patient Name: Brooke Palmer Date of Service: 03/18/2017 10:15 AM Medical Record Number: [id number] Patient Account Number: [id number] Date of Birth/Sex: 05/31/1960 (57 y.o. 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 The good news is the ulcerations on the left lower  extremity appear to be doing well with one of the areas over the great toe region actually healed. In regard to the left second toe it appears this to may be healed although I'm not 100% confident in calling this so just based on this initial evaluation there does not appear to be any drainage however. Subsequently there is also a left heel deep tissue injury which appears to be minimal and does not appear to hurt her at this point which is good news. Unfortunately secondary to her mental status she is unable to rate or describe any discomfort she may be having. She is a resident at a group home. However with the severity of her impairment including the need for a Hoyer lift in order to move her around I am wondering  if she should not actually be a skilled patient. No fevers, chills, nausea, or vomiting noted at this time. Patient did previously have pneumonia as well as acute kidney failure when she was most recently in the hospital prior to discharge and then subsequent follow-up with [place]. 02/25/17 the good news is the wounds that I was previously evaluating patient forcing to be doing better and have healed. Unfortunately she has a blister on the left heel and what appears to be a blood blister on the right heel both immediately that are new neither appear to be open in both appear to be dry and intact for the time being. There is no evidence of infection at this point. Patient does not seem to have any significant pain which is good news. 03/18/17 on evaluation today patient appears to be doing decently well in regard to her feet where her blisters seem to be drying out and nothing seems to be worsening. Nonetheless unfortunately she has a quite significant ulceration on the sacral region which is newly evaluated today. According to patient's caregiver the patient does spend quite a significant amount of time sitting up in her chair unfortunately. I think this is going to have to change. She does  seem to have discomfort in regard to the sacral wound where she doesn't seem to respond as much in regard to her feet and deep tissue injuries noted there. Patient History Information obtained from Patient. Social History Never smoker, Marital Status - Single, Alcohol Use - Never, Drug Use - No History, Caffeine Use - Never. Medical And Surgical History Notes Respiratory recent hospitalization for aspiration pneumonia and kidney failure Cardiovascular cardiomegaly Neurologic Down Syndrome PTSD CAYLAH, PLOUFF ([id number]) Review of Systems (ROS) Constitutional Symptoms (General Health) Denies complaints or symptoms of Fever, Chills. Respiratory The patient has no complaints or symptoms. Cardiovascular The patient has no complaints or symptoms. Psychiatric The patient has no complaints or symptoms. Objective Constitutional Chronically ill appearing but in no apparent acute distress. Vitals Time Taken: 10:05 AM, Temperature: 98.0 F, Pulse: 84 bpm, Respiratory Rate: 18 breaths/min, Blood Pressure: 138/84 mmHg. Respiratory normal breathing without difficulty. clear to auscultation bilaterally. Cardiovascular regular rate and rhythm with normal S1, S2. Psychiatric Patient is not able to cooperate in decision making regarding care. Patient has dementia. patient is confused. General Notes: Patient's deep tissue injury is on the feet seem to be loosening up and improving which is excellent news. With that being said the ulceration on the sacral region shows a significant amount of slough in the central portion of the wound and this is very close to bone although there is no bone exposure at this point that can be determined. This is an insatiable ulcer. With that being said there is some area of granulation surrounding patient does have a lot of discomfort at the site. There is definite pressure injury and this needs to be addressed by nursing/care staff. Integumentary (Hair,  Skin) Wound #3 status is Open. Original cause of wound was Pressure Injury. The wound is located on the Midline Sacrum. The wound measures 6.2cm length x 12.2cm width x 0.9cm depth; 59.408cm^2 area and 53.467cm^3 volume. There is Fat Layer (Subcutaneous Tissue) Exposed exposed. There is no tunneling or undermining noted. There is a large amount of serosanguineous drainage noted. The wound margin is indistinct and nonvisible. There is small (1-33%) red granulation within the wound bed. There is a large (67-100%) amount of necrotic tissue within the wound bed including Eschar and Adherent Slough. The  periwound skin appearance exhibited: Excoriation, Scarring, Maceration, Erythema. The periwound skin appearance did not exhibit: Dry/Scaly. The surrounding wound skin color is noted with erythema. Other Condition(s) Patient presents with Suspected Deep Tissue Injury located on the Left heel. The skin appearance exhibited: Callus. The skin appearance did not exhibit: Atrophie Blanche, Crepitus, Cyanosis, Dry/Scaly, Ecchymosis, Erythema, Excoriation, Friable, Hemosiderin Staining, Induration, Maceration, Mottled, Pallor, Rash, Rubor, Scarring. Skin temperature was noted as No Abnormality. General Notes: three dark colored areas noted on the left foot, non open areas. 2.3x 2.2 x 0.1, 1.2 x 0.8x0.1, one dark area noted on the lateral aspect of the fifth metatarsal head measuring 1.1 x 1.2 x0.1 Brooke Palmer, Brooke Palmer ([id number]) Patient presents with Suspected Deep Tissue Injury located on the Right Foot. The skin appearance exhibited: Callus. The skin appearance did not exhibit: Atrophie Blanche, Crepitus, Cyanosis, Dry/Scaly, Ecchymosis, Erythema, Excoriation, Friable, Hemosiderin Staining, Induration, Maceration, Mottled, Pallor, Rash, Rubor, Scarring. Skin temperature was noted as No Abnormality. General Notes: dark area noted non open wound area , area appears healed continue to use heel cups and booties to  protect her heels Assessment Active Problems ICD-10 L98.8 - Other specified disorders of the skin and subcutaneous tissue R41.841 - Cognitive communication deficit R13.13 - Dysphagia, pharyngeal phase I82.409 - Acute embolism and thrombosis of unspecified deep veins of unspecified lower extremity G40.909 - Epilepsy, unspecified, not intractable, without status epilepticus Procedures Wound #3 Pre-procedure diagnosis of Wound #3 is a Pressure Ulcer located on the Midline Sacrum . There was a Non-Selective Chemical/enzymatic debridement (non-viable tissue was removed) performed by STONE III, Taneeka Curtner E., PA-C.. A time out was conducted at 11:15, prior to the start of the procedure. The procedure was tolerated well with a pain level of 0 throughout and a pain level of 0 following the procedure. Post Debridement Measurements: 6.2cm length x 12.2cm width x 0.9cm depth; 53.467cm^3 volume. Post debridement Stage noted as Category/Stage IV. Character of Wound/Ulcer Post Debridement is stable. Post procedure Diagnosis Wound #3: Same as Pre-Procedure Plan Anesthetic (add to Medication List): Topical Lidocaine 4% cream applied to wound bed prior to debridement (In Clinic Only). Skin Barriers/Peri-Wound Care: Other: - left heel-betadine right heel - betadine Primary Wound Dressing: Wound #3 Midline Sacrum: Santyl Ointment - apply nickel thick to white areas on mid sacral wound, then apply a saline soaked gauze over the santyl, then silvercell on top the wound to cover the rest of the wound area. Secondary Dressing: Wound #3 Midline Sacrum: ABD pad - to cover the dressing applied and then secure with Tegaderm dressings to seal and waterproof the wound from urine and feces. The tegaderm should cover the entire dressing to waterproof. Dressing Change Frequency: Brooke Palmer, Brooke Palmer ([id number]) Wound #3 Midline Sacrum: Change dressing every day. Follow-up Appointments: Wound #3 Midline Sacrum: Return  Appointment in 1 week. Off-Loading: Wound #3 Midline Sacrum: Mattress - alternating air mattress to patients weight Turn and reposition every 2 hours - patient needs to be offloading of the sacral area and does not need to sit up in chair/recliner more than 2 hours at a time. Reposition in the bed every 2 hours as well to offload patients sacral area. She should not be in any 1 position for longer than 2 hours in general. Other: - Prevalon cushioned boots bilateral with heel cups and gauze wrap At this point I did provide very specific orders for offloading, dressing, and mattress for this patient. Please see above. Subsequently if we are not able to maintain her dressing lease throughout  one day with the tegaderm covering in order to keep things dry and clean then I do believe we may need to consider a catheter in order to keep her at least dry. We will see how things are in one weeks time. Please see above for specific wound care orders. We will see patient for re-evaluation in 1 week(s) here in the clinic. If anything worsens or changes patient will contact our office for additional recommendations. Electronic Signature(s) Signed: 04/02/2017 10:42:09 AM By: Lenda Kelp PA-C Previous Signature: 03/18/2017 4:49:26 PM Version By: Lenda Kelp PA-C Entered By: Lenda Kelp on 04/02/2017 08:20:53 Brooke Palmer, Brooke Palmer ([id number]) -------------------------------------------------------------------------------- ROS/PFSH Details Patient Name: Brooke Palmer Date of Service: 03/18/2017 10:15 AM Medical Record Number: [id number] Patient Account Number: [id number] Date of Birth/Sex: [birth year] (57 y.o. Female) Treating RN: Renne Crigler Primary Care Provider: Katina Dung Other Clinician: Referring Provider: Katina Dung Treating Provider/Extender: STONE III, Izeyah Deike Weeks in Treatment: 5 Information Obtained From Patient Wound History Do you currently have one or more open woundso Yes How  many open wounds do you currently haveo 1 Approximately how long have you had your woundso 1 .5 weeks Has your wound(s) ever healed and then re-openedo Yes Have you had any lab work done in the past montho Yes Who ordered the lab work doneo in hospital Have you tested positive for an antibiotic resistant organism (MRSA, VRE)o No Have you tested positive for osteomyelitis (bone infection)o No Have you had any tests for circulation on your legso No Constitutional Symptoms (General Health) Complaints and Symptoms: Negative for: Fever; Chills Respiratory Complaints and Symptoms: No Complaints or Symptoms Medical History: Positive for: Aspiration - pneumonia resent hospitalization Negative for: Asthma; Chronic Obstructive Pulmonary Disease (COPD); Pneumothorax; Sleep Apnea; Tuberculosis Past Medical History Notes: recent hospitalization for aspiration pneumonia and kidney failure Cardiovascular Complaints and Symptoms: No Complaints or Symptoms Medical History: Positive for: Deep Vein Thrombosis; Hypotension Negative for: Angina; Arrhythmia; Congestive Heart Failure; Coronary Artery Disease; Hypertension; Myocardial Infarction; Peripheral Arterial Disease; Peripheral Venous Disease; Phlebitis; Vasculitis Past Medical History Notes: cardiomegaly Gastrointestinal Medical History: Negative for: Cirrhosis ; Colitis; Crohnos; Hepatitis A; Hepatitis B; Hepatitis C Endocrine Brooke Palmer, Brooke Palmer ([id number]) Medical History: Negative for: Type I Diabetes; Type II Diabetes Genitourinary Medical History: Positive for: End Stage Renal Disease - hospitalization for kidney failure Immunological Medical History: Negative for: Lupus Erythematosus; Raynaudos; Scleroderma Integumentary (Skin) Medical History: Positive for: History of pressure wounds Negative for: History of Burn Musculoskeletal Medical History: Negative for: Gout; Rheumatoid Arthritis; Osteoarthritis Neurologic Medical  History: Positive for: Dementia; Seizure Disorder Negative for: Neuropathy; Quadriplegia; Paraplegia Past Medical History Notes: Down Syndrome PTSD Psychiatric Complaints and Symptoms: No Complaints or Symptoms Immunizations Pneumococcal Vaccine: Received Pneumococcal Vaccination: Yes Implantable Devices Family and Social History Never smoker; Marital Status - Single; Alcohol Use: Never; Drug Use: No History; Caffeine Use: Never; Financial Concerns: No; Food, Clothing or Shelter Needs: No; Support System Lacking: No; Transportation Concerns: No; Advanced Directives: No; Patient does not want information on Advanced Directives; Do not resuscitate: No; Living Will: No; Medical Power of Attorney: No Physician Affirmation I have reviewed and agree with the above information. Electronic Signature(s) Signed: 03/18/2017 4:43:00 PM By: Renne Crigler Signed: 03/18/2017 4:49:26 PM By: Lenda Kelp PA-C Entered By: Lenda Kelp on 03/18/2017 13:53:56 Brooke Palmer, Brooke Palmer ([id number]) Brooke Palmer, Brooke Palmer ([id number]) -------------------------------------------------------------------------------- SuperBill Details Patient Name: Brooke Palmer Date of Service: 03/18/2017 Medical Record Number: [id number] Patient Account Number: [id number] Date of Birth/Sex: [birth year] (57 y.o. Female) Treating RN: Renne Crigler  Primary Care Provider: Katina DungOYALS, HOOVER Other Clinician: Referring Provider: Katina DungOYALS, HOOVER Treating Provider/Extender: Linwood DibblesSTONE III, Karuna Balducci Weeks in Treatment: 5 Diagnosis Coding ICD-10 Codes Code Description L98.8 Other specified disorders of the skin and subcutaneous tissue R41.841 Cognitive communication deficit R13.13 Dysphagia, pharyngeal phase I82.409 Acute embolism and thrombosis of unspecified deep veins of unspecified lower extremity G40.909 Epilepsy, unspecified, not intractable, without status epilepticus Facility Procedures CPT4 Code: [id number] Description: [id number] - DEBRIDE W/O  ANES NON SELECT Modifier: Quantity: 1 Physician Procedures CPT4 Code Description: [id number] - WC PHYS LEVEL 3 - EST PT ICD-10 Diagnosis Description L98.8 Other specified disorders of the skin and subcutaneous tissu R41.841 Cognitive communication deficit R13.13 Dysphagia, pharyngeal phase I82.409 Acute  embolism and thrombosis of unspecified deep veins of u Modifier: e nspecified lower Quantity: 1 extremity Electronic Signature(s) Signed: 03/19/2017 8:02:56 AM By: Renne CriglerFlinchum, Cheryl Signed: 03/19/2017 1:41:25 PM By: Lenda KelpStone III, Gabrielle Mester PA-C Previous Signature: 03/18/2017 4:49:26 PM Version By: Lenda KelpStone III, Lakiesha Ralphs PA-C Entered By: Renne CriglerFlinchum, Cheryl on 03/19/2017 08:02:55

## 2017-03-21 NOTE — Progress Notes (Signed)
Brooke Palmer, Brooke Palmer ([id number]) Visit Report for 03/18/2017 Arrival Information Details Patient Name: Brooke Palmer, Brooke Palmer Date of Service: 03/18/2017 10:15 AM Medical Record Number: [id number] Patient Account Number: [id number] Date of Birth/Sex: 03/09/1960 (57 y.o. Female) Treating RN: Renne CriglerFlinchum, Cheryl Primary Care Elim Economou: Katina DungOYALS, HOOVER Other Clinician: Referring Milan Perkins: Katina DungOYALS, HOOVER Treating Akina Maish/Extender: Linwood DibblesSTONE III, HOYT Weeks in Treatment: 5 Visit Information History Since Last Visit All ordered tests and consults were completed: No Patient Arrived: Wheel Chair Added or deleted any medications: No Arrival Time: 10:02 Any new allergies or adverse reactions: No Accompanied By: caregiver Had a fall or experienced change in No Transfer Assistance: Nurse, adultHoyer Lift activities of daily living that may affect Patient Identification Verified: Yes risk of falls: Secondary Verification Process Completed: Yes Signs or symptoms of abuse/neglect since last visito No Hospitalized since last visit: No Pain Present Now: No Electronic Signature(s) Signed: 03/18/2017 4:43:00 PM By: Renne CriglerFlinchum, Cheryl Entered By: Renne CriglerFlinchum, Cheryl on 03/18/2017 10:04:18 Brooke Palmer, Brooke Palmer ([id number]) -------------------------------------------------------------------------------- Encounter Discharge Information Details Patient Name: Brooke Palmer, Huxley Date of Service: 03/18/2017 10:15 AM Medical Record Number: [id number] Patient Account Number: [id number] Date of Birth/Sex: 11/01/1960 (57 y.o. Female) Treating RN: Renne CriglerFlinchum, Cheryl Primary Care Rashied Corallo: Katina DungOYALS, HOOVER Other Clinician: Referring Evangelynn Lochridge: Katina DungOYALS, HOOVER Treating Sandro Burgo/Extender: Linwood DibblesSTONE III, HOYT Weeks in Treatment: 5 Encounter Discharge Information Items Schedule Follow-up Appointment: No Medication Reconciliation completed and No provided to Patient/Care Clint Strupp: Provided on Clinical Summary of Care: 03/18/2017 Form Type Recipient Paper Patient  CM Electronic Signature(s) Signed: 03/20/2017 9:47:07 AM By: Gwenlyn PerkingMoore, Shelia Entered By: Gwenlyn PerkingMoore, Shelia on 03/18/2017 11:36:04 Brooke Palmer, Brooke Palmer ([id number]) -------------------------------------------------------------------------------- Lower Extremity Assessment Details Patient Name: Brooke Palmer, Brooke Palmer Date of Service: 03/18/2017 10:15 AM Medical Record Number: [id number] Patient Account Number: [id number] Date of Birth/Sex: 10/20/1960 (57 y.o. Female) Treating RN: Renne CriglerFlinchum, Cheryl Primary Care Amyre Segundo: Katina DungOYALS, HOOVER Other Clinician: Referring Cataleia Gade: Katina DungOYALS, HOOVER Treating Helmi Hechavarria/Extender: STONE III, HOYT Weeks in Treatment: 5 Edema Assessment Assessed: [Left: No] [Right: No] Edema: [Left: Yes] [Right: Yes] Vascular Assessment Claudication: Claudication Assessment [Left:None] [Right:None] Pulses: Dorsalis Pedis Palpable: [Left:Yes] [Right:Yes] Posterior Tibial Extremity colors, hair growth, and conditions: Extremity Color: [Left:Normal] [Right:Normal] Temperature of Extremity: [Left:Cool] [Right:Cool] Capillary Refill: [Left:< 3 seconds] [Right:< 3 seconds] Toe Nail Assessment Left: Right: Thick: Yes Yes Discolored: Yes Yes Deformed: No Improper Length and Hygiene: No No Electronic Signature(s) Signed: 03/18/2017 4:43:00 PM By: Renne CriglerFlinchum, Cheryl Entered By: Renne CriglerFlinchum, Cheryl on 03/18/2017 10:31:38 Brooke Palmer, Brooke Palmer ([id number]) -------------------------------------------------------------------------------- Multi Wound Chart Details Patient Name: Brooke Palmer, Brooke Palmer Date of Service: 03/18/2017 10:15 AM Medical Record Number: [id number] Patient Account Number: [id number] Date of Birth/Sex: 02/16/1960 (57 y.o. 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] [N/A:N/A] Comorbid History: [3:Aspiration, Deep Vein Thrombosis, Hypotension, End Stage Renal Disease, History of pressure wounds, Dementia, Seizure Disorder] [N/A:N/A] Date Acquired: [3:03/04/2017] [N/A:N/A] Weeks of Treatment: [3:0] [N/A:N/A] Wound Status: [3:Open] [N/A:N/A] Measurements L x W x D [3:6.2x12.2x0.9] [N/A:N/A] (cm) Area (cm) : [3:59.408] [N/A:N/A] Volume (cm) : [3:53.467] [N/A:N/A] % Reduction in Area: [3:0.00%] [N/A:N/A] % Reduction in Volume: [3:0.00%] [N/A:N/A] Classification: [3:Category/Stage IV] [N/A:N/A] Exudate Amount: [3:Large] [N/A:N/A] Exudate Type: [3:Serosanguineous] [N/A:N/A] Exudate Color: [3:red, brown] [N/A:N/A] Wound Margin: [3:Indistinct, nonvisible] [N/A:N/A] Granulation Amount: [3:Small (1-33%)] [N/A:N/A] Granulation Quality: [3:Red] [N/A:N/A] Necrotic Amount: [3:Large (  67-100%)] [N/A:N/A] Necrotic Tissue: [3:Eschar, Adherent Slough] [N/A:N/A] Exposed Structures: [3:Fat Layer (Subcutaneous Tissue) Exposed: Yes] [N/A:N/A] Epithelialization: [3:None] [N/A:N/A] Periwound Skin Texture: [3:Excoriation: Yes Scarring: Yes] [N/A:N/A] Periwound Skin Moisture: [3:Maceration: Yes Dry/Scaly: No] [N/A:N/A] Periwound Skin Color: [3:Erythema: Yes] [N/A:N/A] Tenderness on Palpation: [3:No] [N/A:N/A] Wound Preparation: [N/A:N/A] Ulcer Cleansing: Rinsed/Irrigated with Saline Topical Anesthetic Applied: Other: lidocaine 4% Treatment Notes Electronic Signature(s) Signed: 03/18/2017 4:43:00 PM By: Renne Crigler Entered By: Renne Crigler on 03/18/2017 10:56:22 Brooke Palmer, Brooke Palmer ([id number]) -------------------------------------------------------------------------------- Multi-Disciplinary Care Plan  Details Patient Name: Brooke Palmer Date of Service: 03/18/2017 10:15 AM Medical Record Number: [id number] Patient Account Number: [id number] Date of Birth/Sex: 01/26/1961 (57 y.o. Female) Treating RN: Renne Crigler Primary Care Kaitlan Bin: Katina Dung Other Clinician: Referring Lougenia Morrissey: Katina Dung Treating Marqual Mi/Extender: Linwood Dibbles, HOYT Weeks in Treatment: 5 Active Inactive ` Orientation to the Wound Care Program Nursing Diagnoses: Knowledge deficit related to the wound healing center program Goals: Patient/caregiver will verbalize understanding of the Wound Healing Center Program Date Initiated: 02/11/2017 Target Resolution Date: 03/14/2017 Goal Status: Active Interventions: Provide education on orientation to the wound center Notes: ` Wound/Skin Impairment Nursing Diagnoses: Impaired tissue integrity Knowledge deficit related to ulceration/compromised skin integrity Goals: Patient/caregiver will verbalize understanding of skin care regimen Date Initiated: 02/11/2017 Target Resolution Date: 03/14/2017 Goal Status: Active Ulcer/skin breakdown will have a volume reduction of 30% by week 4 Date Initiated: 02/11/2017 Target Resolution Date: 03/14/2017 Goal Status: Active Interventions: Assess patient/caregiver ability to perform ulcer/skin care regimen upon admission and as needed Assess ulceration(s) every visit Treatment Activities: Skin care regimen initiated : 02/11/2017 Notes: Electronic Signature(s) Signed: 03/18/2017 4:43:00 PM By: Ammie Ferrier, Eber Jones ([id number]) Entered By: Renne Crigler on 03/18/2017 10:31:43 Brooke Palmer ([id number]) -------------------------------------------------------------------------------- Non-Wound Condition Assessment Details Patient Name: Brooke Palmer Date of Service: 03/18/2017 10:15 AM Medical Record Number: [id number] Patient Account Number: [id number] Date of Birth/Sex: 1960-11-06 (57 y.o.  Female) Treating RN: Renne Crigler Primary Care Maxyne Derocher: Katina Dung Other Clinician: Referring Paighton Godette: Katina Dung Treating Chaske Paskett/Extender: STONE III, HOYT Weeks in Treatment: 5 Non-Wound Condition: Condition: Suspected Deep Tissue Injury Location: Other: heel Side: Left Photos Periwound Skin Texture Texture Color No Abnormalities Noted: No No Abnormalities Noted: No Callus: Yes Atrophie Blanche: No Crepitus: No Cyanosis: No Excoriation: No Ecchymosis: No Friable: No Erythema: No Induration: No Hemosiderin Staining: No Rash: No Mottled: No Scarring: No Pallor: No Rubor: No Moisture No Abnormalities Noted: No Temperature / Pain Dry / Scaly: No Temperature: No Abnormality Maceration: No Notes three dark colored areas noted on the left foot, non open areas. 2.3x 2.2 x 0.1, 1.2 x 0.8x0.1, one dark area noted on the lateral aspect of the fifth metatarsal head measuring 1.1 x 1.2 x0.1 Electronic Signature(s) Signed: 03/18/2017 12:32:15 PM By: Renne Crigler Entered By: Renne Crigler on 03/18/2017 12:32:15 Brooke Palmer ([id number]) -------------------------------------------------------------------------------- Non-Wound Condition Assessment Details Patient Name: Brooke Palmer Date of Service: 03/18/2017 10:15 AM Medical Record Number: [id number] Patient Account Number: [id number] Date of Birth/Sex: 1960/12/05 (57 y.o. Female) Treating RN: Renne Crigler Primary Care Markeis Allman: Katina Dung Other Clinician: Referring Ellijah Leffel: Katina Dung Treating Nazier Neyhart/Extender: STONE III, HOYT Weeks in Treatment: 5 Non-Wound Condition: Condition: Suspected Deep Tissue Injury Location: Foot Side: Right Photos Periwound Skin Texture Texture Color No Abnormalities Noted: No No Abnormalities Noted: No Callus: Yes Atrophie Blanche: No Crepitus: No Cyanosis: No Excoriation: No Ecchymosis: No Friable: No Erythema: No Induration: No Hemosiderin  Staining: No Rash: No Mottled: No Scarring: No Pallor: No Rubor: No Moisture No Abnormalities Noted: No Temperature / Pain  Dry / Scaly: No Temperature: No Abnormality Maceration: No Notes dark area noted non open wound area , area appears healed continue to use heel cups and booties to protect her heels Electronic Signature(s) Signed: 03/18/2017 12:32:52 PM By: Renne Crigler Entered By: Renne Crigler on 03/18/2017 12:32:52 Brooke Palmer ([id number]) -------------------------------------------------------------------------------- Pain Assessment Details Patient Name: Brooke Palmer Date of Service: 03/18/2017 10:15 AM Medical Record Number: [id number] Patient Account Number: [id number] Date of Birth/Sex: Jul 04, 1960 (57 y.o. Female) Treating RN: Renne Crigler Primary Care Fulton Merry: Katina Dung Other Clinician: Referring Welma Mccombs: Katina Dung Treating Lahela Woodin/Extender: STONE III, HOYT Weeks in Treatment: 5 Active Problems Location of Pain Severity and Description of Pain Patient Has Paino Patient Unable to Respond Site Locations Rate the pain. Current Pain Level: 4 Pain Management and Medication Current Pain Management: Notes patient has down syndrome and nonverbal Electronic Signature(s) Signed: 03/18/2017 4:43:00 PM By: Renne Crigler Entered By: Renne Crigler on 03/18/2017 10:05:06 Brooke Palmer ([id number]) -------------------------------------------------------------------------------- Wound Assessment Details Patient Name: Brooke Palmer Date of Service: 03/18/2017 10:15 AM Medical Record Number: [id number] Patient Account Number: [id number] Date of Birth/Sex: 01-14-1961 (57 y.o. Female) Treating RN: Renne Crigler Primary Care Eland Lamantia: Katina Dung Other Clinician: Referring Maela Takeda: Katina Dung Treating Rocquel Askren/Extender: STONE III, HOYT Weeks in Treatment: 5 Wound Status Wound Number: 3 Primary Pressure Ulcer Etiology: Wound Location:  Sacrum - Midline Wound Open Wounding Event: Pressure Injury Status: Date Acquired: 03/04/2017 Comorbid Aspiration, Deep Vein Thrombosis, Hypotension, Weeks Of Treatment: 0 History: End Stage Renal Disease, History of pressure Clustered Wound: No wounds, Dementia, Seizure Disorder Photos Photo Uploaded By: Renne Crigler on 03/18/2017 12:30:33 Wound Measurements Length: (cm) 6.2 Width: (cm) 12.2 Depth: (cm) 0.9 Area: (cm) 59.408 Volume: (cm) 53.467 % Reduction in Area: 0% % Reduction in Volume: 0% Epithelialization: None Tunneling: No Undermining: No Wound Description Classification: Category/Stage IV Wound Margin: Indistinct, nonvisible Exudate Amount: Large Exudate Type: Serosanguineous Exudate Color: red, brown Foul Odor After Cleansing: No Slough/Fibrino Yes Wound Bed Granulation Amount: Small (1-33%) Exposed Structure Granulation Quality: Red Fat Layer (Subcutaneous Tissue) Exposed: Yes Necrotic Amount: Large (67-100%) Necrotic Quality: Eschar, Adherent Slough Periwound Skin Texture Texture Color No Abnormalities Noted: No No Abnormalities Noted: No Excoriation: Yes Erythema: Yes DOSTER, Jamicia ([id number]) Scarring: Yes Moisture No Abnormalities Noted: No Dry / Scaly: No Maceration: Yes Wound Preparation Ulcer Cleansing: Rinsed/Irrigated with Saline Topical Anesthetic Applied: Other: lidocaine 4%, Electronic Signature(s) Signed: 03/18/2017 4:43:00 PM By: Renne Crigler Entered By: Renne Crigler on 03/18/2017 10:17:37 Brooke Palmer ([id number]) -------------------------------------------------------------------------------- Vitals Details Patient Name: Brooke Palmer Date of Service: 03/18/2017 10:15 AM Medical Record Number: [id number] Patient Account Number: [id number] Date of Birth/Sex: 09-18-1960 (57 y.o. Female) Treating RN: Renne Crigler Primary Care Maveryk Renstrom: Katina Dung Other Clinician: Referring Rosabella Edgin: Katina Dung Treating Sheldon Sem/Extender: STONE III, HOYT Weeks in Treatment: 5 Vital Signs Time Taken: 10:05 Temperature (F): 98.0 Pulse (bpm): 84 Respiratory Rate (breaths/min): 18 Blood Pressure (mmHg): 138/84 Reference Range: 80 - 120 mg / dl Electronic Signature(s) Signed: 03/18/2017 4:43:00 PM By: Renne Crigler Entered By: Renne Crigler on 03/18/2017 10:08:03

## 2017-03-25 ENCOUNTER — Encounter: Payer: Medicare Other | Admitting: Physician Assistant

## 2017-03-25 DIAGNOSIS — E11621 Type 2 diabetes mellitus with foot ulcer: Secondary | ICD-10-CM | POA: Diagnosis not present

## 2017-03-27 NOTE — Progress Notes (Signed)
Brooke AngelMARTIN, Daylen ([id number]) Visit Report for 03/25/2017 Arrival Information Details Patient Name: Brooke AngelMARTIN, Leyda Date of Service: 03/25/2017 2:45 PM Medical Record Number: [id number] Patient Account Number: [id number] Date of Birth/Sex: 01/23/1961 (57 y.o. Female) Treating RN: Renne CriglerFlinchum, Cheryl Primary Care Kalyn Hofstra: Katina DungOYALS, HOOVER Other Clinician: Referring Karen Kinnard: Katina DungOYALS, HOOVER Treating Tavaria Mackins/Extender: Linwood DibblesSTONE III, HOYT Weeks in Treatment: 6 Visit Information History Since Last Visit All ordered tests and consults were No Patient Arrived: Wheel Chair completed: Arrival Time: 15:01 Added or deleted any medications: No Accompanied By: caregiver- Any new allergies or adverse reactions: No lucy Had a fall or experienced change in No Transfer Assistance: Nurse, adultHoyer Lift activities of daily living that may affect Patient Identification Verified: Yes risk of falls: Secondary Verification Process Completed: Yes Signs or symptoms of abuse/neglect since No Patient Requires Transmission-Based No last visito Precautions: Hospitalized since last visit: No Patient Has Alerts: No Pain Present Now: Unable to Respond Electronic Signature(s) Signed: 03/26/2017 4:28:38 PM By: Renne CriglerFlinchum, Cheryl Entered By: Renne CriglerFlinchum, Cheryl on 03/25/2017 15:05:02 Brooke AngelMARTIN, Kilie ([id number]) -------------------------------------------------------------------------------- Clinic Level of Care Assessment Details Patient Name: Brooke AngelMARTIN, Lynee Date of Service: 03/25/2017 2:45 PM Medical Record Number: [id number] Patient Account Number: [id number] Date of Birth/Sex: 03/12/1960 (57 y.o. Female) Treating RN: Renne CriglerFlinchum, Cheryl Primary Care Sahvannah Rieser: Katina DungOYALS, HOOVER Other Clinician: Referring Zackarie Chason: Katina DungOYALS, HOOVER Treating Simmie Camerer/Extender: Linwood DibblesSTONE III, HOYT Weeks in Treatment: 6 Clinic Level of Care Assessment Items TOOL 4 Quantity Score []  - Use when only an EandM is performed on FOLLOW-UP visit 0 ASSESSMENTS - Nursing  Assessment / Reassessment X - Reassessment of Co-morbidities (includes updates in patient status) 1 10 X- 1 5 Reassessment of Adherence to Treatment Plan ASSESSMENTS - Wound and Skin Assessment / Reassessment X - Simple Wound Assessment / Reassessment - one wound 1 5 []  - 0 Complex Wound Assessment / Reassessment - multiple wounds []  - 0 Dermatologic / Skin Assessment (not related to wound area) ASSESSMENTS - Focused Assessment []  - Circumferential Edema Measurements - multi extremities 0 []  - 0 Nutritional Assessment / Counseling / Intervention []  - 0 Lower Extremity Assessment (monofilament, tuning fork, pulses) []  - 0 Peripheral Arterial Disease Assessment (using hand held doppler) ASSESSMENTS - Ostomy and/or Continence Assessment and Care []  - Incontinence Assessment and Management 0 []  - 0 Ostomy Care Assessment and Management (repouching, etc.) PROCESS - Coordination of Care X - Simple Patient / Family Education for ongoing care 1 15 []  - 0 Complex (extensive) Patient / Family Education for ongoing care []  - 0 Staff obtains ChiropractorConsents, Records, Test Results / Process Orders []  - 0 Staff telephones HHA, Nursing Homes / Clarify orders / etc []  - 0 Routine Transfer to another Facility (non-emergent condition) []  - 0 Routine Hospital Admission (non-emergent condition) []  - 0 New Admissions / Manufacturing engineernsurance Authorizations / Ordering NPWT, Apligraf, etc. []  - 0 Emergency Hospital Admission (emergent condition) X- 1 10 Simple Discharge Coordination Brooke AngelMARTIN, Shayra ([id number]) []  - 0 Complex (extensive) Discharge Coordination PROCESS - Special Needs []  - Pediatric / Minor Patient Management 0 []  - 0 Isolation Patient Management []  - 0 Hearing / Language / Visual special needs []  - 0 Assessment of Community assistance (transportation, D/C planning, etc.) []  - 0 Additional assistance / Altered mentation []  - 0 Support Surface(s) Assessment (bed, cushion, seat,  etc.) INTERVENTIONS - Wound Cleansing / Measurement X - Simple Wound Cleansing - one wound 1 5 []  - 0 Complex Wound Cleansing - multiple wounds X- 1 5 Wound Imaging (photographs - any number of wounds) []  - 0  Wound Tracing (instead of photographs) X- 1 5 Simple Wound Measurement - one wound []  - 0 Complex Wound Measurement - multiple wounds INTERVENTIONS - Wound Dressings X - Small Wound Dressing one or multiple wounds 1 10 []  - 0 Medium Wound Dressing one or multiple wounds []  - 0 Large Wound Dressing one or multiple wounds []  - 0 Application of Medications - topical []  - 0 Application of Medications - injection INTERVENTIONS - Miscellaneous []  - External ear exam 0 []  - 0 Specimen Collection (cultures, biopsies, blood, body fluids, etc.) []  - 0 Specimen(s) / Culture(s) sent or taken to Lab for analysis X- 1 10 Patient Transfer (multiple staff / Nurse, adult / Similar devices) []  - 0 Simple Staple / Suture removal (25 or less) []  - 0 Complex Staple / Suture removal (26 or more) []  - 0 Hypo / Hyperglycemic Management (close monitor of Blood Glucose) []  - 0 Ankle / Brachial Index (ABI) - do not check if billed separately X- 1 5 Vital Signs BONNIE, Ambert ([id number]) Has the patient been seen at the hospital within the last three years: Yes Total Score: 85 Level Of Care: New/Established - Level 3 Electronic Signature(s) Signed: 03/26/2017 4:28:38 PM By: Renne Crigler Entered By: Renne Crigler on 03/25/2017 16:10:22 Brooke Palmer ([id number]) -------------------------------------------------------------------------------- Lower Extremity Assessment Details Patient Name: Brooke Palmer Date of Service: 03/25/2017 2:45 PM Medical Record Number: [id number] Patient Account Number: [id number] Date of Birth/Sex: Jul 23, 1960 (57 y.o. Female) Treating RN: Renne Crigler Primary Care Floetta Brickey: Katina Dung Other Clinician: Referring Rook Maue: Katina Dung Treating  Margaretmary Prisk/Extender: Linwood Dibbles, HOYT Weeks in Treatment: 6 Electronic Signature(s) Signed: 03/26/2017 4:28:38 PM By: Renne Crigler Entered By: Renne Crigler on 03/25/2017 15:37:32 Brooke Palmer ([id number]) -------------------------------------------------------------------------------- Multi Wound Chart Details Patient Name: Brooke Palmer Date of Service: 03/25/2017 2:45 PM Medical Record Number: [id number] Patient Account Number: [id number] Date of Birth/Sex: 1961-01-19 (57 y.o. 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] [N/A:N/A] Comorbid History: [3:Aspiration, Deep Vein Thrombosis, Hypotension, End Stage Renal Disease, History of pressure wounds, Dementia, Seizure Disorder] [N/A:N/A] Date Acquired: [3:03/04/2017] [N/A:N/A] Weeks of Treatment: [3:1] [N/A:N/A] Wound Status: [3:Open] [N/A:N/A] Measurements L x W x D [3:6.4x10.5x0.9] [N/A:N/A] (cm) Area (cm) : [3:52.779] [N/A:N/A] Volume (cm) : [3:47.501] [N/A:N/A] % Reduction in Area: [3:11.20%] [N/A:N/A] % Reduction in Volume: [3:11.20%] [N/A:N/A] Classification: [3:Category/Stage IV] [N/A:N/A] Exudate Amount: [3:Large] [N/A:N/A] Exudate Type: [3:Serosanguineous] [N/A:N/A] Exudate Color: [3:red, brown] [N/A:N/A] Wound Margin: [3:Indistinct, nonvisible] [N/A:N/A] Granulation Amount: [3:Small (1-33%)] [N/A:N/A] Granulation Quality: [3:Red] [N/A:N/A] Necrotic Amount: [3:Large (67-100%)] [N/A:N/A] Necrotic Tissue: [3:Eschar, Adherent Slough]  [N/A:N/A] Exposed Structures: [3:Fat Layer (Subcutaneous Tissue) Exposed: Yes] [N/A:N/A] Epithelialization: [3:None] [N/A:N/A] Periwound Skin Texture: [3:Excoriation: Yes Scarring: Yes] [N/A:N/A] Periwound Skin Moisture: [3:Maceration: Yes Dry/Scaly: No] [N/A:N/A] Periwound Skin Color: [3:Erythema: Yes] [N/A:N/A] Erythema Location: [3:Circumferential] [N/A:N/A] Tenderness on Palpation: [3:No] [N/A:N/A] Wound Preparation: Ulcer Cleansing: N/A N/A Rinsed/Irrigated with Saline Topical Anesthetic Applied: Other: lidocaine 4% Treatment Notes Electronic Signature(s) Signed: 03/26/2017 4:28:38 PM By: Renne Crigler Entered By: Renne Crigler on 03/25/2017 15:37:56 Brooke Palmer ([id number]) -------------------------------------------------------------------------------- Multi-Disciplinary Care Plan Details Patient Name: Brooke Palmer Date of Service: 03/25/2017 2:45 PM Medical Record Number: [id number] Patient Account Number: [id number] Date of Birth/Sex: 1960/08/18 (56 y.o.  Female) Treating RN: Renne Crigler Primary Care Khailee Mick: Katina Dung Other Clinician: Referring Gusta Marksberry: Katina Dung Treating Kynan Peasley/Extender: Linwood Dibbles, HOYT Weeks in Treatment: 6 Active Inactive ` Orientation to the Wound Care Program Nursing Diagnoses: Knowledge deficit related to the wound healing center program Goals: Patient/caregiver will verbalize understanding of the Wound Healing Center Program Date Initiated: 02/11/2017 Target Resolution Date: 03/14/2017 Goal Status: Active Interventions: Provide education on orientation to the wound center Notes: ` Wound/Skin Impairment Nursing Diagnoses: Impaired tissue integrity Knowledge deficit related to ulceration/compromised skin integrity Goals: Patient/caregiver will verbalize understanding of skin care regimen Date Initiated: 02/11/2017 Target Resolution Date: 03/14/2017 Goal Status: Active Ulcer/skin breakdown will have a volume  reduction of 30% by week 4 Date Initiated: 02/11/2017 Target Resolution Date: 03/14/2017 Goal Status: Active Interventions: Assess patient/caregiver ability to perform ulcer/skin care regimen upon admission and as needed Assess ulceration(s) every visit Treatment Activities: Skin care regimen initiated : 02/11/2017 Notes: Electronic Signature(s) Signed: 03/26/2017 4:28:38 PM By: Ammie Ferrier, Eber Jones ([id number]) Entered By: Renne Crigler on 03/25/2017 15:37:44 Brooke Palmer ([id number]) -------------------------------------------------------------------------------- Pain Assessment Details Patient Name: Brooke Palmer Date of Service: 03/25/2017 2:45 PM Medical Record Number: [id number] Patient Account Number: [id number] Date of Birth/Sex: 04/04/1960 (57 y.o. Female) Treating RN: Renne Crigler Primary Care Austine Wiedeman: Katina Dung Other Clinician: Referring Ofelia Podolski: Katina Dung Treating Alister Staver/Extender: STONE III, HOYT Weeks in Treatment: 6 Active Problems Location of Pain Severity and Description of Pain Patient Has Paino Patient Unable to Respond Site Locations Rate the pain. Current Pain Level: 2 Pain Management and Medication Current Pain Management: Electronic Signature(s) Signed: 03/26/2017 4:28:38 PM By: Renne Crigler Entered By: Renne Crigler on 03/25/2017 15:05:14 Brooke Palmer ([id number]) -------------------------------------------------------------------------------- Wound Assessment Details Patient Name: Brooke Palmer Date of Service: 03/25/2017 2:45 PM Medical Record Number: [id number] Patient Account Number: [id number] Date of Birth/Sex: 1960/08/10 (57 y.o. Female) Treating RN: Renne Crigler Primary Care Michiah Masse: Katina Dung Other Clinician: Referring Geraldy Akridge: Katina Dung Treating Lyn Deemer/Extender: STONE III, HOYT Weeks in Treatment: 6 Wound Status Wound Number: 3 Primary Pressure Ulcer Etiology: Wound Location:  Sacrum - Midline Wound Open Wounding Event: Pressure Injury Status: Date Acquired: 03/04/2017 Comorbid Aspiration, Deep Vein Thrombosis, Hypotension, Weeks Of Treatment: 1 History: End Stage Renal Disease, History of pressure Clustered Wound: No wounds, Dementia, Seizure Disorder Photos Photo Uploaded By: Elliot Gurney, BSN, RN, CWS, Kim on 03/26/2017 07:52:27 Wound Measurements Length: (cm) 6.4 Width: (cm) 10.5 Depth: (cm) 0.9 Area: (cm) 52.779 Volume: (cm) 47.501 % Reduction in Area: 11.2% % Reduction in Volume: 11.2% Epithelialization: None Tunneling: No Undermining: No Wound Description Classification: Category/Stage IV Wound Margin: Indistinct, nonvisible Exudate Amount: Large Exudate Type: Serosanguineous Exudate Color: red, brown Foul Odor After Cleansing: No Slough/Fibrino Yes Wound Bed Granulation Amount: Small (1-33%) Exposed Structure Granulation Quality: Red Fat Layer (Subcutaneous Tissue) Exposed: Yes Necrotic Amount: Large (67-100%) Necrotic Quality: Eschar, Adherent Slough Periwound Skin Texture Texture Color No Abnormalities Noted: No No Abnormalities Noted: No Excoriation: Yes Erythema: Yes NAKAJIMA, Neri ([id number]) Scarring: Yes Erythema Location: Circumferential Moisture No Abnormalities Noted: No Dry / Scaly: No Maceration: Yes Wound Preparation Ulcer Cleansing: Rinsed/Irrigated with Saline Topical Anesthetic Applied: Other: lidocaine 4%, Electronic Signature(s) Signed: 03/26/2017 4:28:38 PM By: Renne Crigler Entered By: Renne Crigler on 03/25/2017 15:35:51 Brooke Palmer ([id number]) -------------------------------------------------------------------------------- Vitals Details Patient Name: Brooke Palmer Date of Service: 03/25/2017 2:45 PM Medical Record Number: [id number] Patient Account Number: [id number] Date of Birth/Sex: 1960/08/22 (57 y.o. Female) Treating RN: Renne Crigler Primary Care Meila Berke: Katina Dung Other  Clinician: Referring Sheli Dorin: Katina Dung Treating  Adaijah Endres/Extender: Linwood Dibbles, HOYT Weeks in Treatment: 6 Vital Signs Time Taken: 03:05 Temperature (F): 98.0 Pulse (bpm): 82 Respiratory Rate (breaths/min): 18 Blood Pressure (mmHg): 115/52 Reference Range: 80 - 120 mg / dl Electronic Signature(s) Signed: 03/26/2017 4:28:38 PM By: Renne Crigler Entered By: Renne Crigler on 03/25/2017 15:08:48

## 2017-03-27 NOTE — Progress Notes (Signed)
EYDIE, WORMLEY ([id number]) Visit Report for 03/25/2017 Chief Complaint Document Details Patient Name: Brooke Palmer, Brooke Palmer Date of Service: 03/25/2017 2:45 PM Medical Record Number: [id number] Patient Account Number: [id number] Date of Birth/Sex: Oct 28, 1960 (57 y.o. Female) Treating RN: Renne Crigler Primary Care Provider: Katina Dung Other Clinician: Referring Provider: Katina Dung Treating Provider/Extender: Linwood Dibbles, HOYT Weeks in Treatment: 6 Information Obtained from: Patient Chief Complaint Left second toe ulcer and right heel DTI Electronic Signature(s) Signed: 03/25/2017 5:25:11 PM By: Lenda Kelp PA-C Entered By: Lenda Kelp on 03/25/2017 15:10:47 Brooke Palmer ([id number]) -------------------------------------------------------------------------------- HPI Details Patient Name: Brooke Palmer Date of Service: 03/25/2017 2:45 PM Medical Record Number: [id number] Patient Account Number: [id number] Date of Birth/Sex: [birth year] (57 y.o. 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 The good news is the ulcerations on the left lower extremity appear to be doing well with one of the areas over the great toe region actually healed. In regard to the left second toe it appears this to may be healed although I'm not 100% confident in calling this so just based on this initial  evaluation there does not appear to be any drainage however. Subsequently there is also a left heel deep tissue injury which appears to be minimal and does not appear to hurt her at this point which is good news. Unfortunately secondary to her mental status she is unable to rate or describe any discomfort she may be having. She is a resident at a group home. However with the severity of her impairment including the need for a Hoyer lift in order to move her around I am wondering if she should not actually be a skilled patient. No fevers, chills, nausea, or vomiting noted at this time. Patient did previously have pneumonia as well as acute kidney failure when she was most recently in the hospital prior to discharge and then subsequent follow-up with [place]. 02/25/17 the good news is the wounds that I was previously evaluating patient forcing to be doing better and have healed. Unfortunately she has a blister on the left heel and what appears to be a blood blister on the right heel both immediately that are new neither appear to be open in both appear to be dry and intact for the time being. There is no evidence of infection at this point. Patient does not seem to have any significant pain which is good news. 03/18/17 on evaluation today patient appears to be doing decently well in regard to her feet where her blisters seem to be drying out and nothing seems to be worsening. Nonetheless unfortunately she has a quite significant ulceration on the sacral region which is newly evaluated today. According to patient's caregiver the patient does spend quite a significant amount of time sitting up in her chair unfortunately. I think this is going to have to change. She does seem to have discomfort in regard to the sacral wound where she doesn't seem to respond as much in regard to her feet and deep tissue injuries noted there.  03/25/17 On evaluation today Brooke Palmer's ulcer in the sacral region actually appears to  be doing somewhat worse unfortunately. It does appear that she has new injury compared to last week's evaluation which is unfortunate. She also seems to be having a significant amount of discomfort which is also unfortunate. The individual who was with her today during her evaluation generally works night shifts and so does not know what happened during the day as far as sitting up for extended periods of time although it appears that she may be. At least that is my opinion. When she's in the bed it does not sound like she's in the bed where there would be pressure applied to the area where the ulcer seems to be worsening and there's additional injury. No fevers, chills, nausea, or vomiting noted at this time. Patient foot wounds appear to be doing about the same and in some cases a little better. Electronic Signature(s) Signed: 03/25/2017 5:25:11 PM By: Lenda Kelp PA-C Entered By: Lenda Kelp on 03/25/2017 16:55:36 Brooke Palmer ([id number]) -------------------------------------------------------------------------------- Physical Exam Details Patient Name: Brooke Palmer Date of Service: 03/25/2017 2:45 PM Medical Record Number: [id number] Patient Account Number: [id number] Date of Birth/Sex: 1960/06/18 (57 y.o. Female) Treating RN: Renne Crigler Primary Care Provider: Katina Dung Other Clinician: Referring Provider: Katina Dung Treating Provider/Extender: STONE III, HOYT Weeks in Treatment: 6 Constitutional Well-nourished and well-hydrated in no acute distress. Respiratory normal breathing without difficulty. clear to auscultation bilaterally. Cardiovascular regular rate and rhythm with normal S1, S2. Psychiatric Patient is not able to cooperate in decision making regarding care. Patient has dementia. patient is confused. Notes Patient's wound bed appears to be mostly slough covered although there is some area of granulation noted. I consider debridement although  there really was not a long consideration of this due to the fact that she has such discomfort just with attempting to lightly rub or cleanse the wound but I'm not sure she'd be able to tolerate the debridement very well. Also if we work to do this I would need to get consent from the responsible party/power of attorney. Electronic Signature(s) Signed: 03/25/2017 5:25:11 PM By: Lenda Kelp PA-C Entered By: Lenda Kelp on 03/25/2017 16:56:24 Brooke Palmer ([id number]) -------------------------------------------------------------------------------- Physician Orders Details Patient Name: Brooke Palmer Date of Service: 03/25/2017 2:45 PM Medical Record Number: [id number] Patient Account Number: [id number] Date of Birth/Sex: 11-16-1960 (57 y.o. Female) Treating RN: Renne Crigler Primary Care Provider: Katina Dung Other Clinician: Referring Provider: Katina Dung Treating Provider/Extender: Linwood Dibbles, HOYT Weeks in Treatment: 6 Verbal / Phone Orders: No Diagnosis Coding ICD-10 Coding Code Description L98.8 Other specified disorders of the skin and subcutaneous tissue R41.841 Cognitive communication deficit R13.13 Dysphagia, pharyngeal phase I82.409 Acute embolism and thrombosis of unspecified deep veins of unspecified lower extremity G40.909 Epilepsy, unspecified, not intractable, without status epilepticus Anesthetic (add to Medication List) o Topical Lidocaine 4% cream applied to wound bed prior to debridement (In Clinic Only). Skin Barriers/Peri-Wound Care o Other: - left heel-betadine right heel - betadine . place heel cup on heels bilateral. wrap with kerlix gauze and secure with tape . Primary Wound Dressing Wound #3 Midline Sacrum o Santyl Ointment - apply nickel thick to white areas on mid sacral wound, then apply a saline soaked gauze over the santyl, then silvercell on top the wound to cover the rest of the wound area. Secondary Dressing Wound #3 Midline  Sacrum o ABD pad - to cover the dressing applied and then secure with Tegaderm dressings  to seal and waterproof the wound from urine and feces. The tegaderm should cover the entire dressing to waterproof. Dressing Change Frequency Wound #3 Midline Sacrum o Change dressing every day. Follow-up Appointments Wound #3 Midline Sacrum o Return Appointment in 1 week. Off-Loading Wound #3 Midline Sacrum o Roho cushion for wheelchair o Mattress - alternating air mattress to patients weight o Turn and reposition every 2 hours - patient needs to be offloading of the sacral area and does not need to sit up in chair/recliner more than 2 hours at a time. Reposition in the bed every 2 hours as well to offload patients sacral area. She should not be in any 1 position for longer than 2 hours in general. o Other: - Prevalon cushioned boots bilateral with heel cups and gauze wrap Brooke Palmer, Brooke Palmer ([id number]) Electronic Signature(s) Signed: 03/25/2017 5:25:11 PM By: Lenda Kelp PA-C Signed: 03/26/2017 4:28:38 PM By: Renne Crigler Entered By: Renne Crigler on 03/25/2017 15:43:58 Brooke Palmer ([id number]) -------------------------------------------------------------------------------- Problem List Details Patient Name: Brooke Palmer Date of Service: 03/25/2017 2:45 PM Medical Record Number: [id number] Patient Account Number: [id number] Date of Birth/Sex: [birth year] (57 y.o. Female) Treating RN: Renne Crigler Primary Care Provider: Katina Dung Other Clinician: Referring Provider: Katina Dung Treating Provider/Extender: Linwood Dibbles, HOYT Weeks in Treatment: 6 Active Problems ICD-10 Encounter Code Description Active Date Diagnosis L98.8 Other specified disorders of the skin and subcutaneous tissue 02/11/2017 Yes R41.841 Cognitive communication deficit 02/11/2017 Yes R13.13 Dysphagia, pharyngeal phase 02/11/2017 Yes I82.409 Acute embolism and thrombosis of unspecified deep  veins of 02/11/2017 Yes unspecified lower extremity G40.909 Epilepsy, unspecified, not intractable, without status epilepticus 02/11/2017 Yes Inactive Problems Resolved Problems Electronic Signature(s) Signed: 03/25/2017 5:25:11 PM By: Lenda Kelp PA-C Entered By: Lenda Kelp on 03/25/2017 15:29:59 Brooke Palmer ([id number]) -------------------------------------------------------------------------------- Progress Note Details Patient Name: Brooke Palmer Date of Service: 03/25/2017 2:45 PM Medical Record Number: [id number] Patient Account Number: [id number] Date of Birth/Sex: [birth year] (57 y.o. 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 The good news is the ulcerations on the left lower extremity appear to be doing well with one of the areas over the great toe region actually healed. In regard to the left second toe it appears this to may be healed although I'm not 100% confident in calling this so just based on this initial evaluation there does not appear to be any drainage however. Subsequently there is also a left heel deep tissue injury which appears to be minimal and does not appear to hurt her at this point which  is good news. Unfortunately secondary to her mental status she is unable to rate or describe any discomfort she may be having. She is a resident at a group home. However with the severity of her impairment including the need for a Hoyer lift in order to move her around I am wondering if she should not actually  be a skilled patient. No fevers, chills, nausea, or vomiting noted at this time. Patient did previously have pneumonia as well as acute kidney failure when she was most recently in the hospital prior to discharge and then subsequent follow-up with [place]. 02/25/17 the good news is the wounds that I was previously evaluating patient forcing to be doing better and have healed. Unfortunately she has a blister on the left heel and what appears to be a blood blister on the right heel both immediately that are new neither appear to be open in both appear to be dry and intact for the time being. There is no evidence of infection at this point. Patient does not seem to have any significant pain which is good news. 03/18/17 on evaluation today patient appears to be doing decently well in regard to her feet where her blisters seem to be drying out and nothing seems to be worsening. Nonetheless unfortunately she has a quite significant ulceration on the sacral region which is newly evaluated today. According to patient's caregiver the patient does spend quite a significant amount of time sitting up in her chair unfortunately. I think this is going to have to change. She does seem to have discomfort in regard to the sacral wound where she doesn't seem to respond as much in regard to her feet and deep tissue injuries noted there. 03/25/17 On evaluation today Mrs. Jalloh's ulcer in the sacral region actually appears to be doing somewhat worse unfortunately. It does appear that she has new injury compared to last week's evaluation which is unfortunate. She also seems to be having a significant amount of discomfort  which is also unfortunate. The individual who was with her today during her evaluation generally works night shifts and so does not know what happened during the day as far as sitting up for extended periods of time although it appears that she may be. At least that is my opinion. When she's in the bed it does not sound like she's in the bed where there would be pressure applied to the area where the ulcer seems to be worsening and there's additional injury. No fevers, chills, nausea, or vomiting noted at this time. Patient foot wounds appear to be doing about the same and in some cases a little better. Patient History Information obtained from Patient. Social History Brooke Palmer, Brooke Palmer ([id number]) Never smoker, Marital Status - Single, Alcohol Use - Never, Drug Use - No History, Caffeine Use - Never. Medical And Surgical History Notes Respiratory recent hospitalization for aspiration pneumonia and kidney failure Cardiovascular cardiomegaly Neurologic Down Syndrome PTSD Review of Systems (ROS) Constitutional Symptoms (General Health) Denies complaints or symptoms of Fever, Chills. Respiratory The patient has no complaints or symptoms. Cardiovascular The patient has no complaints or symptoms. Psychiatric The patient has no complaints or symptoms. Objective Constitutional Well-nourished and well-hydrated in no acute distress. Vitals Time Taken: 3:05 AM, Temperature: 98.0 F, Pulse: 82 bpm, Respiratory Rate: 18 breaths/min, Blood Pressure: 115/52 mmHg. Respiratory normal breathing without difficulty. clear to auscultation bilaterally. Cardiovascular regular rate and rhythm with normal S1, S2. Psychiatric Patient is not able to cooperate in decision making regarding care. Patient has dementia. patient is confused. General Notes: Patient's wound bed appears to be mostly slough covered although there is some area of granulation noted. I consider debridement although there really was  not a long consideration of this due to the fact that she has such discomfort just with attempting to lightly rub or cleanse the wound but  I'm not sure she'd be able to tolerate the debridement very well. Also if we work to do this I would need to get consent from the responsible party/power of attorney. Integumentary (Hair, Skin) Wound #3 status is Open. Original cause of wound was Pressure Injury. The wound is located on the Midline Sacrum. The wound measures 6.4cm length x 10.5cm width x 0.9cm depth; 52.779cm^2 area and 47.501cm^3 volume. There is Fat Layer (Subcutaneous Tissue) Exposed exposed. There is no tunneling or undermining noted. There is a large amount of serosanguineous drainage noted. The wound margin is indistinct and nonvisible. There is small (1-33%) red granulation within the wound bed. There is a large (67-100%) amount of necrotic tissue within the wound bed including Eschar and Adherent Slough. The periwound skin appearance exhibited: Excoriation, Scarring, Maceration, Erythema. The periwound skin Brooke Palmer, Brooke Palmer ([id number]) appearance did not exhibit: Dry/Scaly. The surrounding wound skin color is noted with erythema which is circumferential. Assessment Active Problems ICD-10 L98.8 - Other specified disorders of the skin and subcutaneous tissue R41.841 - Cognitive communication deficit R13.13 - Dysphagia, pharyngeal phase I82.409 - Acute embolism and thrombosis of unspecified deep veins of unspecified lower extremity G40.909 - Epilepsy, unspecified, not intractable, without status epilepticus Plan Anesthetic (add to Medication List): Topical Lidocaine 4% cream applied to wound bed prior to debridement (In Clinic Only). Skin Barriers/Peri-Wound Care: Other: - left heel-betadine right heel - betadine . place heel cup on heels bilateral. wrap with kerlix gauze and secure with tape . Primary Wound Dressing: Wound #3 Midline Sacrum: Santyl Ointment - apply nickel thick to  white areas on mid sacral wound, then apply a saline soaked gauze over the santyl, then silvercell on top the wound to cover the rest of the wound area. Secondary Dressing: Wound #3 Midline Sacrum: ABD pad - to cover the dressing applied and then secure with Tegaderm dressings to seal and waterproof the wound from urine and feces. The tegaderm should cover the entire dressing to waterproof. Dressing Change Frequency: Wound #3 Midline Sacrum: Change dressing every day. Follow-up Appointments: Wound #3 Midline Sacrum: Return Appointment in 1 week. Off-Loading: Wound #3 Midline Sacrum: Roho cushion for wheelchair Mattress - alternating air mattress to patients weight Turn and reposition every 2 hours - patient needs to be offloading of the sacral area and does not need to sit up in chair/recliner more than 2 hours at a time. Reposition in the bed every 2 hours as well to offload patients sacral area. She should not be in any 1 position for longer than 2 hours in general. Other: - Prevalon cushioned boots bilateral with heel cups and gauze wrap Brooke Palmer, Brooke Palmer ([id number]) At this point I'm gonna recommend that we continue with the Santyl as well thousand and for the sacral wound. Also did write fairly extensive orders in regard to an alternating air mattress as possible a roho cushion for the wheelchair she will continue with the Prevalon Boots as well. We are going to get in touch with the facility/group home where she is currently residing as I do feel like she needs to have these items stress that should this possible. Also did reiterate on her discharge paperwork that it seems that she may be a better candidate for skilled nursing versus the group home facility she is currently at. Nonetheless that is again something that I would recommend having their physician reevaluate. Please see above for specific wound care orders. We will see patient for re-evaluation in 1 week(s) here in the  clinic.  If anything worsens or changes patient will contact our office for additional recommendations. Electronic Signature(s) Signed: 03/25/2017 5:25:11 PM By: Lenda Kelp PA-C Entered By: Lenda Kelp on 03/25/2017 16:58:03 Brooke Palmer ([id number]) -------------------------------------------------------------------------------- ROS/PFSH Details Patient Name: Brooke Palmer Date of Service: 03/25/2017 2:45 PM Medical Record Number: [id number] Patient Account Number: [id number] Date of Birth/Sex: 06-16-1960 (57 y.o. Female) Treating RN: Renne Crigler Primary Care Provider: Katina Dung Other Clinician: Referring Provider: Katina Dung Treating Provider/Extender: STONE III, HOYT Weeks in Treatment: 6 Information Obtained From Patient Wound History Do you currently have one or more open woundso Yes How many open wounds do you currently haveo 1 Approximately how long have you had your woundso 1 .5 weeks Has your wound(s) ever healed and then re-openedo Yes Have you had any lab work done in the past montho Yes Who ordered the lab work doneo in hospital Have you tested positive for an antibiotic resistant organism (MRSA, VRE)o No Have you tested positive for osteomyelitis (bone infection)o No Have you had any tests for circulation on your legso No Constitutional Symptoms (General Health) Complaints and Symptoms: Negative for: Fever; Chills Respiratory Complaints and Symptoms: No Complaints or Symptoms Medical History: Positive for: Aspiration - pneumonia resent hospitalization Negative for: Asthma; Chronic Obstructive Pulmonary Disease (COPD); Pneumothorax; Sleep Apnea; Tuberculosis Past Medical History Notes: recent hospitalization for aspiration pneumonia and kidney failure Cardiovascular Complaints and Symptoms: No Complaints or Symptoms Medical History: Positive for: Deep Vein Thrombosis; Hypotension Negative for: Angina; Arrhythmia; Congestive Heart Failure;  Coronary Artery Disease; Hypertension; Myocardial Infarction; Peripheral Arterial Disease; Peripheral Venous Disease; Phlebitis; Vasculitis Past Medical History Notes: cardiomegaly Gastrointestinal Medical History: Negative for: Cirrhosis ; Colitis; Crohnos; Hepatitis A; Hepatitis B; Hepatitis C Endocrine Brooke Palmer, Brooke Palmer ([id number]) Medical History: Negative for: Type I Diabetes; Type II Diabetes Genitourinary Medical History: Positive for: End Stage Renal Disease - hospitalization for kidney failure Immunological Medical History: Negative for: Lupus Erythematosus; Raynaudos; Scleroderma Integumentary (Skin) Medical History: Positive for: History of pressure wounds Negative for: History of Burn Musculoskeletal Medical History: Negative for: Gout; Rheumatoid Arthritis; Osteoarthritis Neurologic Medical History: Positive for: Dementia; Seizure Disorder Negative for: Neuropathy; Quadriplegia; Paraplegia Past Medical History Notes: Down Syndrome PTSD Psychiatric Complaints and Symptoms: No Complaints or Symptoms Immunizations Pneumococcal Vaccine: Received Pneumococcal Vaccination: Yes Implantable Devices Family and Social History Never smoker; Marital Status - Single; Alcohol Use: Never; Drug Use: No History; Caffeine Use: Never; Financial Concerns: No; Food, Clothing or Shelter Needs: No; Support System Lacking: No; Transportation Concerns: No; Advanced Directives: No; Patient does not want information on Advanced Directives; Do not resuscitate: No; Living Will: No; Medical Power of Attorney: No Physician Affirmation I have reviewed and agree with the above information. Electronic Signature(s) Signed: 03/25/2017 5:25:11 PM By: Lenda Kelp PA-C Signed: 03/26/2017 4:28:38 PM By: Renne Crigler Entered By: Lenda Kelp on 03/25/2017 16:55:58 Brooke Palmer, Brooke Palmer ([id number]) Brooke Palmer, Brooke Palmer  ([id number]) -------------------------------------------------------------------------------- SuperBill Details Patient Name: Brooke Palmer Date of Service: 03/25/2017 Medical Record Number: [id number] Patient Account Number: [id number] Date of Birth/Sex: Dec 19, 1960 (57 y.o. Female) Treating RN: Renne Crigler Primary Care Provider: Katina Dung Other Clinician: Referring Provider: Katina Dung Treating Provider/Extender: Linwood Dibbles, HOYT Weeks in Treatment: 6 Diagnosis Coding ICD-10 Codes Code Description L98.8 Other specified disorders of the skin and subcutaneous tissue R41.841 Cognitive communication deficit R13.13 Dysphagia, pharyngeal phase I82.409 Acute embolism and thrombosis of unspecified deep veins of unspecified lower extremity G40.909 Epilepsy, unspecified, not intractable, without status epilepticus Facility Procedures CPT4 Code: [id number] Description: [id number] - WOUND CARE  VISIT-LEV 3 EST PT Modifier: Quantity: 1 Physician Procedures CPT4 Code Description: [id number] - WC PHYS LEVEL 4 - EST PT ICD-10 Diagnosis Description L98.8 Other specified disorders of the skin and subcutaneous tissu R41.841 Cognitive communication deficit R13.13 Dysphagia, pharyngeal phase I82.409 Acute  embolism and thrombosis of unspecified deep veins of u Modifier: e nspecified lower Quantity: 1 extremity Electronic Signature(s) Signed: 03/25/2017 5:25:11 PM By: Lenda Kelp PA-C Entered By: Lenda Kelp on 03/25/2017 16:58:19

## 2017-04-01 ENCOUNTER — Inpatient Hospital Stay (HOSPITAL_COMMUNITY)
Admission: EM | Admit: 2017-04-01 | Discharge: 2017-04-10 | DRG: 871 | Disposition: A | Discharge status: Skilled Nursing Facility | Payer: Medicare Other | Attending: Internal Medicine | Admitting: Internal Medicine

## 2017-04-01 ENCOUNTER — Emergency Department (HOSPITAL_COMMUNITY): Payer: Medicare Other

## 2017-04-01 ENCOUNTER — Encounter: Payer: Medicare Other | Admitting: Physician Assistant

## 2017-04-01 ENCOUNTER — Encounter (HOSPITAL_COMMUNITY): Payer: Self-pay | Admitting: Emergency Medicine

## 2017-04-01 DIAGNOSIS — Z7189 Other specified counseling: Secondary | ICD-10-CM

## 2017-04-01 DIAGNOSIS — Z79890 Hormone replacement therapy (postmenopausal): Secondary | ICD-10-CM

## 2017-04-01 DIAGNOSIS — E860 Dehydration: Secondary | ICD-10-CM | POA: Diagnosis present

## 2017-04-01 DIAGNOSIS — Z66 Do not resuscitate: Secondary | ICD-10-CM | POA: Diagnosis not present

## 2017-04-01 DIAGNOSIS — Z881 Allergy status to other antibiotic agents status: Secondary | ICD-10-CM

## 2017-04-01 DIAGNOSIS — E870 Hyperosmolality and hypernatremia: Secondary | ICD-10-CM | POA: Diagnosis present

## 2017-04-01 DIAGNOSIS — E11621 Type 2 diabetes mellitus with foot ulcer: Secondary | ICD-10-CM | POA: Diagnosis not present

## 2017-04-01 DIAGNOSIS — L89622 Pressure ulcer of left heel, stage 2: Secondary | ICD-10-CM | POA: Diagnosis present

## 2017-04-01 DIAGNOSIS — Z7401 Bed confinement status: Secondary | ICD-10-CM

## 2017-04-01 DIAGNOSIS — F79 Unspecified intellectual disabilities: Secondary | ICD-10-CM

## 2017-04-01 DIAGNOSIS — A419 Sepsis, unspecified organism: Secondary | ICD-10-CM | POA: Diagnosis not present

## 2017-04-01 DIAGNOSIS — Z79899 Other long term (current) drug therapy: Secondary | ICD-10-CM

## 2017-04-01 DIAGNOSIS — K219 Gastro-esophageal reflux disease without esophagitis: Secondary | ICD-10-CM | POA: Diagnosis present

## 2017-04-01 DIAGNOSIS — G9341 Metabolic encephalopathy: Secondary | ICD-10-CM | POA: Diagnosis present

## 2017-04-01 DIAGNOSIS — D649 Anemia, unspecified: Secondary | ICD-10-CM | POA: Diagnosis present

## 2017-04-01 DIAGNOSIS — G4700 Insomnia, unspecified: Secondary | ICD-10-CM | POA: Diagnosis present

## 2017-04-01 DIAGNOSIS — F4310 Post-traumatic stress disorder, unspecified: Secondary | ICD-10-CM | POA: Diagnosis present

## 2017-04-01 DIAGNOSIS — G40909 Epilepsy, unspecified, not intractable, without status epilepticus: Secondary | ICD-10-CM

## 2017-04-01 DIAGNOSIS — E876 Hypokalemia: Secondary | ICD-10-CM | POA: Diagnosis present

## 2017-04-01 DIAGNOSIS — S31000A Unspecified open wound of lower back and pelvis without penetration into retroperitoneum, initial encounter: Secondary | ICD-10-CM

## 2017-04-01 DIAGNOSIS — L89159 Pressure ulcer of sacral region, unspecified stage: Secondary | ICD-10-CM | POA: Diagnosis present

## 2017-04-01 DIAGNOSIS — Q909 Down syndrome, unspecified: Secondary | ICD-10-CM

## 2017-04-01 DIAGNOSIS — R32 Unspecified urinary incontinence: Secondary | ICD-10-CM | POA: Diagnosis present

## 2017-04-01 DIAGNOSIS — R4702 Dysphasia: Secondary | ICD-10-CM | POA: Diagnosis present

## 2017-04-01 DIAGNOSIS — I82409 Acute embolism and thrombosis of unspecified deep veins of unspecified lower extremity: Secondary | ICD-10-CM | POA: Diagnosis present

## 2017-04-01 DIAGNOSIS — Z86718 Personal history of other venous thrombosis and embolism: Secondary | ICD-10-CM

## 2017-04-01 DIAGNOSIS — E039 Hypothyroidism, unspecified: Secondary | ICD-10-CM | POA: Diagnosis present

## 2017-04-01 DIAGNOSIS — Z7901 Long term (current) use of anticoagulants: Secondary | ICD-10-CM

## 2017-04-01 DIAGNOSIS — L89154 Pressure ulcer of sacral region, stage 4: Secondary | ICD-10-CM | POA: Diagnosis present

## 2017-04-01 DIAGNOSIS — E861 Hypovolemia: Secondary | ICD-10-CM | POA: Diagnosis present

## 2017-04-01 DIAGNOSIS — Z515 Encounter for palliative care: Secondary | ICD-10-CM

## 2017-04-01 LAB — COMPREHENSIVE METABOLIC PANEL
ALT: 12 U/L — ABNORMAL LOW (ref 14–54)
ANION GAP: 11 (ref 5–15)
AST: 17 U/L (ref 15–41)
Albumin: 2.1 g/dL — ABNORMAL LOW (ref 3.5–5.0)
Alkaline Phosphatase: 122 U/L (ref 38–126)
BUN: 15 mg/dL (ref 6–20)
CHLORIDE: 124 mmol/L — AB (ref 101–111)
CO2: 27 mmol/L (ref 22–32)
Calcium: 8.3 mg/dL — ABNORMAL LOW (ref 8.9–10.3)
Creatinine, Ser: 0.84 mg/dL (ref 0.44–1.00)
GFR calc Af Amer: >60 mL/min (ref >60)
Glucose, Bld: 116 mg/dL — ABNORMAL HIGH (ref 65–99)
POTASSIUM: 2.8 mmol/L — AB (ref 3.5–5.1)
Sodium: 162 mmol/L (ref 135–145)
Total Bilirubin: 0.5 mg/dL (ref 0.3–1.2)
Total Protein: 7.2 g/dL (ref 6.5–8.1)

## 2017-04-01 LAB — I-STAT CG4 LACTIC ACID, ED: LACTIC ACID, VENOUS: 1.81 mmol/L (ref 0.5–1.9)

## 2017-04-01 LAB — CBC WITH DIFFERENTIAL/PLATELET
BASOS ABS: 0 10*3/uL (ref 0.0–0.1)
Basophils Relative: 0 %
EOS PCT: 0 %
Eosinophils Absolute: 0 10*3/uL (ref 0.0–0.7)
HEMATOCRIT: 34.3 % — AB (ref 36.0–46.0)
HEMOGLOBIN: 10.6 g/dL — AB (ref 12.0–15.0)
LYMPHS ABS: 1.9 10*3/uL (ref 0.7–4.0)
LYMPHS PCT: 14 %
MCH: 32.9 pg (ref 26.0–34.0)
MCHC: 30.9 g/dL (ref 30.0–36.0)
MCV: 106.5 fL — AB (ref 78.0–100.0)
Monocytes Absolute: 0.7 10*3/uL (ref 0.1–1.0)
Monocytes Relative: 5 %
NEUTROS ABS: 11.2 10*3/uL — AB (ref 1.7–7.7)
NEUTROS PCT: 81 %
Platelets: 336 10*3/uL (ref 150–400)
RBC: 3.22 MIL/uL — AB (ref 3.87–5.11)
RDW: 17 % — ABNORMAL HIGH (ref 11.5–15.5)
WBC: 13.9 10*3/uL — AB (ref 4.0–10.5)

## 2017-04-01 LAB — MAGNESIUM: Magnesium: 2.4 mg/dL (ref 1.7–2.4)

## 2017-04-01 LAB — I-STAT BETA HCG BLOOD, ED (MC, WL, AP ONLY)

## 2017-04-01 MED ORDER — DEXTROSE 5 % IV SOLN
INTRAVENOUS | Status: DC
  Administered 2017-04-01 – 2017-04-03 (×5): via INTRAVENOUS

## 2017-04-01 MED ORDER — POTASSIUM CHLORIDE 10 MEQ/100ML IV SOLN
10.0000 meq | INTRAVENOUS | Status: AC
  Administered 2017-04-02 (×2): 10 meq via INTRAVENOUS
  Filled 2017-04-01 (×2): qty 100

## 2017-04-01 MED ORDER — ERYTHROMYCIN 5 MG/GM OP OINT
1.0000 "application " | TOPICAL_OINTMENT | Freq: Once | OPHTHALMIC | Status: AC
  Administered 2017-04-01: 1 via OPHTHALMIC
  Filled 2017-04-01: qty 3.5

## 2017-04-01 NOTE — ED Triage Notes (Signed)
[facility] being [facility] for worsening sacral wound and hypotension. BP in triage 128/100. Caregiver reports "patient seems more lethargic." Hx down syndrome.

## 2017-04-01 NOTE — ED Notes (Signed)
Brooke Palmer, main lab is going to add on Magnesium from previous blood draw.

## 2017-04-01 NOTE — ED Notes (Signed)
Patient in radiology

## 2017-04-01 NOTE — ED Provider Notes (Signed)
[facility]-EMERGENCY DEPT Provider Note   CSN: [id number] Arrival date & time: 04/01/17  1812     History   Chief Complaint Chief Complaint  Patient presents with  . Wound Check  . Weakness    HPI Brooke Palmer is a 57 y.o. female.  The history is provided by the patient and medical records.  Wound Check   Weakness     Level 5 caveat: Down syndrome, unable to provide history 57 year old female with history of arthritis, insomnia, PTSD, seizures, pressure ulcers, acid reflux, history of DVT on anticoagulation, presenting to the ED from wound care clinic for further evaluation of sacral wound.  Patient is currently at a group home facility and since December has developed a few pressure ulcers, largest of which is on her sacrum.  She has been seeing the [place] wound care center for this and today at her appointment wound seem to be worse, she was running low-grade fever, and was mildly hypotensive so sent in with concern of sepsis secondary to infected wound.  Employee from the group home has come in with patient states she has been more lethargic and a little confused seeming lately, more so than normal.  States she is continued urinating normally.  They have not noticed any fevers at their facility.  No cough, nasal congestion, or other URI symptoms.  Right eye has been a little red lately.  Of note, patient is at a group home and they are not capable of ongoing long-term care.  It is recommended that she be discharged to a skilled nursing facility once released from the hospital.  Past Medical History:  Diagnosis Date  . Arthritis    osteo  . DVT (deep venous thrombosis) (HCC)    bilateral legs  . Dysphasia   . Insomnia   . PTSD (post-traumatic stress disorder)   . Seizures [facility](HCC)     Patient Active Problem List   Diagnosis Date Noted  . Pressure injury of skin 03/08/2017  . Sepsis (HCC) 03/07/2017  . Fever, unspecified 03/07/2017  . Tachycardia  03/07/2017  . Hypernatremia 03/07/2017  . Anemia 03/07/2017  . ARF (acute renal failure) (HCC) 02/02/2017  . Dehydration 02/02/2017  . Leucocytosis 02/02/2017  . Seizure disorder (HCC) 02/02/2017  . Hypothyroid 02/02/2017  . GERD (gastroesophageal reflux disease) 02/02/2017  . DVT (deep venous thrombosis) (HCC) 02/02/2017    History reviewed. No pertinent surgical history.  OB History    No data available       Home Medications    Prior to Admission medications   Medication Sig Start Date End Date Taking? Authorizing Provider  acetic acid (VOSOL) 2 % otic solution Place 4 drops into both ears 2 (two) times daily.   Yes [provider]  acetic acid-hydrocortisone (VOSOL-HC) OTIC solution Place 4 drops into both ears 3 (three) times daily as needed (ear infection prevention).    Yes [provider]  Calcium Carbonate-Vitamin D (OSCAL 500/200 D-3 PO) Take 1 tablet by mouth 2 (two) times daily.    Yes [provider]  carbamide peroxide (DEBROX) 6.5 % OTIC solution Place 5 drops into both ears 2 (two) times daily as needed (ear infection prevention).    Yes [provider]  Cholecalciferol (VITAMIN D3) 2000 units TABS Take 2,000 Units by mouth daily after breakfast.   Yes [provider]  coal tar (NEUTROGENA T-GEL) 0.5 % shampoo Apply 1 application topically at bedtime as needed (scalp, dandruff).    Yes  [provider]  collagenase (SANTYL) ointment Apply 1 application topically daily.   Yes [provider]  diazepam (DIASTAT ACUDIAL) 10 MG GEL Place 10 mg rectally daily as needed for seizure.   Yes [provider]  docusate sodium (COLACE) 100 MG capsule Take 100 mg by mouth daily.   Yes [provider]  fludrocortisone (FLORINEF) 0.1 MG tablet Take 0.1 mg by mouth daily. 12/12/16  Yes [provider]  lamoTRIgine (LAMICTAL) 100 MG tablet Take 100 mg by mouth daily. 12/12/16  Yes [provider]  levETIRAcetam (KEPPRA) 250 MG tablet Take 1,250 mg by mouth 2 (two) times daily.  02/26/17  Yes [provider]  levothyroxine (SYNTHROID, LEVOTHROID) 100 MCG tablet Take 100 mcg by mouth daily. 12/12/16  Yes [provider]  Lidocaine-Glycerin (PREPARATION H) 5-14.4 % CREA Place 1 application rectally daily as needed (hemorrhoids).    Yes [provider]  Melatonin 3 MG TABS Take 3 mg by mouth at bedtime.    Yes [provider]  memantine (NAMENDA) 5 MG tablet Take 5 mg by mouth daily.  12/12/16  Yes [provider]  Menthol-Zinc Oxide (RISAMINE) 0.44-20.625 % OINT Apply 1 application topically 2 (two) times daily.   Yes [provider]  montelukast (SINGULAIR) 10 MG tablet Take 10 mg by mouth daily. 12/12/16  Yes [provider]  omeprazole (PRILOSEC) 20 MG capsule Take 20 mg by mouth daily. 12/12/16  Yes [provider]  phenylephrine-shark liver oil-mineral oil-petrolatum (PREPARATION H) 0.25-3-14-71.9 % rectal ointment Place 1 application rectally 2 (two) times daily as needed for hemorrhoids.   Yes [provider]  rivaroxaban (XARELTO) 20 MG TABS tablet Take 20 mg by mouth daily with supper.   Yes [provider]  doxycycline (VIBRAMYCIN) 100 MG capsule Take 1 capsule (100 mg total) by mouth 2 (two) times daily. Patient not taking: Reported on 04/01/2017 03/11/17   Leatha Gilding, MD  erythromycin ophthalmic ointment Place a 1/2 inch ribbon of ointment into the lower eyelid QID for one week. Patient not taking: Reported on 02/18/2017 01/27/17   Alvira Monday, MD  levETIRAcetam (KEPPRA) 1000 MG tablet Take 1 tablet (1,000 mg total) by mouth 2 (two) times daily. Patient not taking: Reported on 02/18/2017 01/27/17   Alvira Monday, MD  potassium chloride SA (K-DUR,KLOR-CON) 20 MEQ tablet Take 1 tablet (20 mEq total) by mouth daily. Patient not taking: Reported on 02/18/2017 02/08/17    Meredith Leeds, MD    Family History Family History  Family history unknown: Yes    Social History Social History   Tobacco Use  . Smoking status: Never Smoker  . Smokeless tobacco: Never Used  Substance Use Topics  . Alcohol use: No    Frequency: Never  . Drug use: No     Allergies   Vancomycin   Review of Systems Review of Systems  Unable to perform ROS: Other     Physical Exam Updated Vital Signs BP (!) 158/139 (BP Location: Right Arm)   Pulse (!) 102   Temp 98.1 F (36.7 C) (Oral)   Resp 16   SpO2 98%   Physical Exam  Constitutional: She appears well-developed and well-nourished.  HENT:  Head: Normocephalic and atraumatic.  Mouth/Throat: Oropharynx is clear and moist.  Eyes: Conjunctivae and EOM are normal. Pupils are equal, round, and reactive to light.  Right conjunctive is injected with purulent drainage  Neck: Normal range of motion.  Cardiovascular: Normal rate, regular rhythm  and normal heart sounds.  Pulmonary/Chest: Effort normal and breath sounds normal. No stridor. No respiratory distress.  Abdominal: Soft. Bowel sounds are normal. There is no tenderness. There is no rebound.  Musculoskeletal: Normal range of motion.  Stage 3 decubitus ulcer of sacrum; there is some surrounding erythema and small amount of drainage noted  Neurological: She is alert.  Awake, spontaneously moving arms and legs, moans to pain but does not provide any verbal responses  Skin: Skin is warm and dry.  Psychiatric: She has a normal mood and affect.  Nursing note and vitals reviewed.      ED Treatments / Results  Labs (all labs ordered are listed, but only abnormal results are displayed) Labs Reviewed  COMPREHENSIVE METABOLIC PANEL - Abnormal; Notable for the following components:      Result Value   Sodium 162 (*)    Potassium 2.8 (*)    Chloride 124 (*)    Glucose, Bld 116 (*)    Calcium 8.3 (*)    Albumin 2.1 (*)    ALT 12 (*)    All other  components within normal limits  CBC WITH DIFFERENTIAL/PLATELET - Abnormal; Notable for the following components:   WBC 13.9 (*)    RBC 3.22 (*)    Hemoglobin 10.6 (*)    HCT 34.3 (*)    MCV 106.5 (*)    RDW 17.0 (*)    Neutro Abs 11.2 (*)    All other components within normal limits  URINALYSIS, ROUTINE W REFLEX MICROSCOPIC - Abnormal; Notable for the following components:   Leukocytes, UA TRACE (*)    Bacteria, UA RARE (*)    Squamous Epithelial / LPF 0-5 (*)    All other components within normal limits  CULTURE, BLOOD (ROUTINE X 2)  CULTURE, BLOOD (ROUTINE X 2)  URINE CULTURE  MAGNESIUM  I-STAT CG4 LACTIC ACID, ED  I-STAT BETA HCG BLOOD, ED ([facility], [facility], [facility] ONLY)  I-STAT CG4 LACTIC ACID, ED    EKG  EKG Interpretation None       Radiology Dg Chest 2 View  Result Date: 04/01/2017 CLINICAL DATA:  Fever and altered mentation EXAM: CHEST  2 VIEW COMPARISON:  03/07/2017 FINDINGS: Heart and mediastinal contours are stable and within normal limits. Mild central vascular congestion without acute pneumonic consolidation, effusion or pneumothorax. Mild increase in interstitial prominence of the lung bases are stable. No acute osseous abnormality. IMPRESSION: Mild central vascular congestion without acute pneumonic consolidation. Mild increase in interstitial markings at bases appear chronic likely to reflect changes of mild chronic interstitial disease. Electronically Signed   By: Tollie Eth M.D.   On: 04/01/2017 23:12    Procedures Procedures (including critical care time)  CRITICAL CARE Performed by: Garlon Hatchet   Total critical care time: 45 minutes  Critical care time was exclusive of separately billable procedures and treating other patients.  Critical care was necessary to treat or prevent imminent or life-threatening deterioration.  Critical care was time spent personally by me on the following activities: development of treatment plan with patient and/or surrogate as  well as nursing, discussions with consultants, evaluation of patient's response to treatment, examination of patient, obtaining history from patient or surrogate, ordering and performing treatments and interventions, ordering and review of laboratory studies, ordering and review of radiographic studies, pulse oximetry and re-evaluation of patient's condition.   Medications Ordered in ED Medications  dextrose 5 % solution ( Intravenous New Bag/Given 04/01/17 2347)  potassium chloride 10 mEq in  100 mL IVPB (10 mEq Intravenous New Bag/Given 04/02/17 0111)  erythromycin ophthalmic ointment 1 application (1 application Right Eye Given 04/01/17 2326)     Initial Impression / Assessment and Plan / ED Course  I have reviewed the triage vital signs and the nursing notes.  Pertinent labs & imaging results that were available during my care of the patient were reviewed by me and considered in my medical decision making (see chart for details).  57 y.o. F sent here from wound care center for admission due to worsening sacral wound.  Was apparently hypotensive and febrile there, VSS here.  Sacral wound is quite extensive-- see photos above.  Patient also has been noted to be more lethargic than normal and continues coughing.  Did have recent admission for pneumonia.  Patient labs with findings of hypernatremia.  This is likely contributing.  Started on d5 drip here for slow correction.  K+ is also low, mag is normal.  IV K+ runs ordered x2.  Initially considered starting vanc/zosyn for sacral wounds, however patient has allergy to vancomycin.  Discussed with pharmacy, consider zyvox for coverage of wounds as well as possible respiratory infection.  CXR today is clear.  Will discuss with admitting team.  Discussed with hospitalist-- will admit for ongoing care.  They will order abx.  Final Clinical Impressions(s) / ED Diagnoses   Final diagnoses:  Hypernatremia  Wound of sacral region, initial encounter    Hypokalemia    ED Discharge Orders    None       Garlon Hatchet, PA-C 04/02/17 0203

## 2017-04-01 NOTE — ED Notes (Signed)
Attempted an IV x 2 with no success.  

## 2017-04-02 ENCOUNTER — Inpatient Hospital Stay (HOSPITAL_COMMUNITY): Payer: Medicare Other

## 2017-04-02 ENCOUNTER — Other Ambulatory Visit: Payer: Self-pay

## 2017-04-02 DIAGNOSIS — L89154 Pressure ulcer of sacral region, stage 4: Secondary | ICD-10-CM

## 2017-04-02 DIAGNOSIS — Z79899 Other long term (current) drug therapy: Secondary | ICD-10-CM | POA: Diagnosis not present

## 2017-04-02 DIAGNOSIS — L89150 Pressure ulcer of sacral region, unstageable: Secondary | ICD-10-CM | POA: Diagnosis not present

## 2017-04-02 DIAGNOSIS — L89622 Pressure ulcer of left heel, stage 2: Secondary | ICD-10-CM | POA: Diagnosis present

## 2017-04-02 DIAGNOSIS — F809 Developmental disorder of speech and language, unspecified: Secondary | ICD-10-CM | POA: Diagnosis not present

## 2017-04-02 DIAGNOSIS — E861 Hypovolemia: Secondary | ICD-10-CM | POA: Diagnosis present

## 2017-04-02 DIAGNOSIS — G40909 Epilepsy, unspecified, not intractable, without status epilepticus: Secondary | ICD-10-CM | POA: Diagnosis present

## 2017-04-02 DIAGNOSIS — K219 Gastro-esophageal reflux disease without esophagitis: Secondary | ICD-10-CM | POA: Diagnosis present

## 2017-04-02 DIAGNOSIS — E876 Hypokalemia: Secondary | ICD-10-CM | POA: Diagnosis present

## 2017-04-02 DIAGNOSIS — R32 Unspecified urinary incontinence: Secondary | ICD-10-CM | POA: Diagnosis present

## 2017-04-02 DIAGNOSIS — Z7189 Other specified counseling: Secondary | ICD-10-CM | POA: Diagnosis not present

## 2017-04-02 DIAGNOSIS — D649 Anemia, unspecified: Secondary | ICD-10-CM | POA: Diagnosis present

## 2017-04-02 DIAGNOSIS — L89159 Pressure ulcer of sacral region, unspecified stage: Secondary | ICD-10-CM

## 2017-04-02 DIAGNOSIS — Q909 Down syndrome, unspecified: Secondary | ICD-10-CM

## 2017-04-02 DIAGNOSIS — S31000S Unspecified open wound of lower back and pelvis without penetration into retroperitoneum, sequela: Secondary | ICD-10-CM | POA: Diagnosis not present

## 2017-04-02 DIAGNOSIS — F4310 Post-traumatic stress disorder, unspecified: Secondary | ICD-10-CM | POA: Diagnosis present

## 2017-04-02 DIAGNOSIS — F79 Unspecified intellectual disabilities: Secondary | ICD-10-CM | POA: Diagnosis present

## 2017-04-02 DIAGNOSIS — I824Y3 Acute embolism and thrombosis of unspecified deep veins of proximal lower extremity, bilateral: Secondary | ICD-10-CM | POA: Diagnosis not present

## 2017-04-02 DIAGNOSIS — S31000A Unspecified open wound of lower back and pelvis without penetration into retroperitoneum, initial encounter: Secondary | ICD-10-CM | POA: Diagnosis not present

## 2017-04-02 DIAGNOSIS — R4702 Dysphasia: Secondary | ICD-10-CM | POA: Diagnosis present

## 2017-04-02 DIAGNOSIS — Z7401 Bed confinement status: Secondary | ICD-10-CM | POA: Diagnosis not present

## 2017-04-02 DIAGNOSIS — G4700 Insomnia, unspecified: Secondary | ICD-10-CM | POA: Diagnosis present

## 2017-04-02 DIAGNOSIS — G9341 Metabolic encephalopathy: Secondary | ICD-10-CM | POA: Diagnosis present

## 2017-04-02 DIAGNOSIS — E038 Other specified hypothyroidism: Secondary | ICD-10-CM | POA: Diagnosis not present

## 2017-04-02 DIAGNOSIS — E870 Hyperosmolality and hypernatremia: Secondary | ICD-10-CM | POA: Diagnosis present

## 2017-04-02 DIAGNOSIS — L89153 Pressure ulcer of sacral region, stage 3: Secondary | ICD-10-CM

## 2017-04-02 DIAGNOSIS — Z86718 Personal history of other venous thrombosis and embolism: Secondary | ICD-10-CM | POA: Diagnosis not present

## 2017-04-02 DIAGNOSIS — E860 Dehydration: Secondary | ICD-10-CM | POA: Diagnosis present

## 2017-04-02 DIAGNOSIS — E039 Hypothyroidism, unspecified: Secondary | ICD-10-CM | POA: Diagnosis present

## 2017-04-02 DIAGNOSIS — Z515 Encounter for palliative care: Secondary | ICD-10-CM | POA: Diagnosis not present

## 2017-04-02 DIAGNOSIS — Z66 Do not resuscitate: Secondary | ICD-10-CM | POA: Diagnosis not present

## 2017-04-02 DIAGNOSIS — Z881 Allergy status to other antibiotic agents status: Secondary | ICD-10-CM | POA: Diagnosis not present

## 2017-04-02 DIAGNOSIS — A419 Sepsis, unspecified organism: Secondary | ICD-10-CM | POA: Diagnosis present

## 2017-04-02 LAB — BASIC METABOLIC PANEL
Anion gap: 12 (ref 5–15)
BUN: 14 mg/dL (ref 6–20)
CHLORIDE: 121 mmol/L — AB (ref 101–111)
CO2: 25 mmol/L (ref 22–32)
Calcium: 7.9 mg/dL — ABNORMAL LOW (ref 8.9–10.3)
Creatinine, Ser: 0.86 mg/dL (ref 0.44–1.00)
GFR calc Af Amer: >60 mL/min (ref >60)
GFR calc non Af Amer: >60 mL/min (ref >60)
Glucose, Bld: 125 mg/dL — ABNORMAL HIGH (ref 65–99)
POTASSIUM: 2.7 mmol/L — AB (ref 3.5–5.1)
SODIUM: 158 mmol/L — AB (ref 135–145)

## 2017-04-02 LAB — URINALYSIS, ROUTINE W REFLEX MICROSCOPIC
Bilirubin Urine: NEGATIVE
Glucose, UA: NEGATIVE mg/dL
HGB URINE DIPSTICK: NEGATIVE
Ketones, ur: NEGATIVE mg/dL
Nitrite: NEGATIVE
Protein, ur: NEGATIVE mg/dL
SPECIFIC GRAVITY, URINE: 1.024 (ref 1.005–1.030)
pH: 6 (ref 5.0–8.0)

## 2017-04-02 LAB — OSMOLALITY: OSMOLALITY: 312 mosm/kg — AB (ref 275–295)

## 2017-04-02 LAB — INFLUENZA PANEL BY PCR (TYPE A & B)
INFLAPCR: NEGATIVE
Influenza B By PCR: NEGATIVE

## 2017-04-02 LAB — OSMOLALITY, URINE: OSMOLALITY UR: 686 mosm/kg (ref 300–900)

## 2017-04-02 MED ORDER — POTASSIUM CHLORIDE 10 MEQ/100ML IV SOLN
10.0000 meq | INTRAVENOUS | Status: AC
  Filled 2017-04-02: qty 100

## 2017-04-02 MED ORDER — POTASSIUM CHLORIDE 10 MEQ/100ML IV SOLN
10.0000 meq | INTRAVENOUS | Status: AC
  Administered 2017-04-02 (×2): 10 meq via INTRAVENOUS
  Filled 2017-04-02: qty 100

## 2017-04-02 MED ORDER — LEVETIRACETAM 500 MG/5ML IV SOLN
1250.0000 mg | Freq: Two times a day (BID) | INTRAVENOUS | Status: DC
Start: 2017-04-02 — End: 2017-04-04
  Administered 2017-04-02 – 2017-04-04 (×5): 1250 mg via INTRAVENOUS
  Filled 2017-04-02 (×6): qty 12.5

## 2017-04-02 MED ORDER — ONDANSETRON HCL 4 MG/2ML IJ SOLN: 4.0000 mg | Freq: Four times a day (QID) | INTRAMUSCULAR | Status: DC | PRN

## 2017-04-02 MED ORDER — LORAZEPAM 2 MG/ML IJ SOLN
1.0000 mg | INTRAMUSCULAR | Status: DC | PRN
  Administered 2017-04-09 – 2017-04-10 (×2): 2 mg via INTRAVENOUS
  Filled 2017-04-02 (×2): qty 1

## 2017-04-02 MED ORDER — SODIUM CHLORIDE 0.9 % IV BOLUS (SEPSIS)
1000.0000 mL | Freq: Once | INTRAVENOUS | Status: AC
  Administered 2017-04-02: 1000 mL via INTRAVENOUS

## 2017-04-02 MED ORDER — PIPERACILLIN-TAZOBACTAM 3.375 G IVPB
3.3750 g | Freq: Three times a day (TID) | INTRAVENOUS | Status: DC
  Filled 2017-04-02: qty 50

## 2017-04-02 MED ORDER — HEPARIN SODIUM (PORCINE) 5000 UNIT/ML IJ SOLN
5000.0000 [IU] | Freq: Three times a day (TID) | INTRAMUSCULAR | Status: DC
Start: 2017-04-02 — End: 2017-04-03
  Administered 2017-04-02 – 2017-04-03 (×5): 5000 [IU] via SUBCUTANEOUS
  Filled 2017-04-02 (×5): qty 1

## 2017-04-02 MED ORDER — LINEZOLID 600 MG/300ML IV SOLN
600.0000 mg | Freq: Two times a day (BID) | INTRAVENOUS | Status: DC
  Administered 2017-04-02 (×2): 600 mg via INTRAVENOUS
  Filled 2017-04-02 (×3): qty 300

## 2017-04-02 MED ORDER — ONDANSETRON HCL 4 MG PO TABS: 4.0000 mg | ORAL_TABLET | Freq: Four times a day (QID) | ORAL | Status: DC | PRN

## 2017-04-02 MED ORDER — ERYTHROMYCIN 5 MG/GM OP OINT
TOPICAL_OINTMENT | Freq: Three times a day (TID) | OPHTHALMIC | Status: DC
  Administered 2017-04-02: 1 via OPHTHALMIC
  Administered 2017-04-02 – 2017-04-10 (×24): via OPHTHALMIC
  Filled 2017-04-02 (×2): qty 4

## 2017-04-02 MED ORDER — PIPERACILLIN-TAZOBACTAM 3.375 G IVPB 30 MIN
3.3750 g | INTRAVENOUS | Status: AC
  Administered 2017-04-02: 3.375 g via INTRAVENOUS
  Filled 2017-04-02: qty 50

## 2017-04-02 NOTE — ED Notes (Signed)
ED TO INPATIENT HANDOFF REPORT  Name/Age/Gender Brooke Palmer 57 y.o. female  Code Status    Code Status Orders  (From admission, onward)        Start     Ordered   04/02/17 0608  Full code  Continuous     04/02/17 0607    Code Status History    Date Active Date Inactive Code Status Order ID Comments User Context   03/07/2017 18:46 03/11/2017 17:12 Full Code [id number]  Jani Gravel, MD ED   02/03/2017 02:31 02/07/2017 19:12 Full Code [id number]  Elwyn Reach, MD Inpatient      Home/SNF/Other Nursing Home  Chief Complaint Wound center referral, fever, low blood pressure  Level of Care/Admitting Diagnosis ED Disposition    ED Disposition Condition Comment   Admit  Hospital Area: [facility] [[id number]]  Level of Care: Telemetry [5]  Admit to tele based on following criteria: Other see comments  Comments: hypokalemia  Diagnosis: Sacral decubitus ulcer [[id number]]  Admitting Physician: Bethena Roys [[id number]]  Attending Physician: Bethena Roys [phone]  Estimated length of stay: past midnight tomorrow  Certification:: I certify this patient will need inpatient services for at least 2 midnights  PT Class (Do Not Modify): Inpatient [101]  PT Acc Code (Do Not Modify): Private [1]       Medical History Past Medical History:  Diagnosis Date  . Arthritis    osteo  . DVT (deep venous thrombosis) (HCC)    bilateral legs  . Dysphasia   . Insomnia   . PTSD (post-traumatic stress disorder)   . Seizures (HCC)     Allergies Allergies  Allergen Reactions  . Vancomycin Hives    IV Location/Drains/Wounds Patient Lines/Drains/Airways Status   Active Line/Drains/Airways    Name:   Placement date:   Placement time:   Site:   Days:   Peripheral IV 04/01/17 Left;Upper Arm   04/01/17    2343    Arm   1   Peripheral IV 04/02/17 Left;Posterior Wrist   04/02/17    0420    Wrist   less than 1   External Urinary Catheter   03/07/17    2017    -    26   External Urinary Catheter   04/01/17    2234    -   1   Pressure Injury 02/04/17 Stage II -  Partial thickness loss of dermis presenting as a shallow open ulcer with a red, pink wound bed without slough.   02/04/17    1000     57   Pressure Injury 03/07/17 Deep Tissue Injury - Purple or maroon localized area of discolored intact skin or blood-filled blister due to damage of underlying soft tissue from pressure and/or shear.   03/07/17    2300     26   Pressure Injury Stage III -  Full thickness tissue loss. Subcutaneous fat may be visible but bone, tendon or muscle are NOT exposed.   -    -               Labs/Imaging Results for orders placed or performed during the hospital encounter of 04/01/17 (from the past 48 hour(s))  Comprehensive metabolic panel     Status: Abnormal   Collection Time: 04/01/17  8:03 PM  Result Value Ref Range   Sodium 162 (HH) 135 - 145 mmol/L    Comment: CRITICAL RESULT CALLED TO, READ BACK BY AND VERIFIED WITH: Samul Dada RN  2053 04/01/17 A NAVARRO    Potassium 2.8 (L) 3.5 - 5.1 mmol/L   Chloride 124 (H) 101 - 111 mmol/L   CO2 27 22 - 32 mmol/L   Glucose, Bld 116 (H) 65 - 99 mg/dL   BUN 15 6 - 20 mg/dL   Creatinine, Ser 0.84 0.44 - 1.00 mg/dL   Calcium 8.3 (L) 8.9 - 10.3 mg/dL   Total Protein 7.2 6.5 - 8.1 g/dL   Albumin 2.1 (L) 3.5 - 5.0 g/dL   AST 17 15 - 41 U/L   ALT 12 (L) 14 - 54 U/L   Alkaline Phosphatase 122 38 - 126 U/L   Total Bilirubin 0.5 0.3 - 1.2 mg/dL   GFR calc non Af Amer >60 >60 mL/min   GFR calc Af Amer >60 >60 mL/min    Comment: (NOTE) The eGFR has been calculated using the CKD EPI equation. This calculation has not been validated in all clinical situations. eGFR's persistently <60 mL/min signify possible Chronic Kidney Disease.    Anion gap 11 5 - 15    Comment: Performed at [facility], [place] [address] [place], [place] [address]  CBC with Differential     Status: Abnormal   Collection Time: 04/01/17   8:03 PM  Result Value Ref Range   WBC 13.9 (H) 4.0 - 10.5 K/uL   RBC 3.22 (L) 3.87 - 5.11 MIL/uL   Hemoglobin 10.6 (L) 12.0 - 15.0 g/dL   HCT 34.3 (L) 36.0 - 46.0 %   MCV 106.5 (H) 78.0 - 100.0 fL   MCH 32.9 26.0 - 34.0 pg   MCHC 30.9 30.0 - 36.0 g/dL   RDW 17.0 (H) 11.5 - 15.5 %   Platelets 336 150 - 400 K/uL   Neutrophils Relative % 81 %   Neutro Abs 11.2 (H) 1.7 - 7.7 K/uL   Lymphocytes Relative 14 %   Lymphs Abs 1.9 0.7 - 4.0 K/uL   Monocytes Relative 5 %   Monocytes Absolute 0.7 0.1 - 1.0 K/uL   Eosinophils Relative 0 %   Eosinophils Absolute 0.0 0.0 - 0.7 K/uL   Basophils Relative 0 %   Basophils Absolute 0.0 0.0 - 0.1 K/uL    Comment: Performed at [facility], [place] [address] [place], [place] [address]  Magnesium     Status: None   Collection Time: 04/01/17  8:09 PM  Result Value Ref Range   Magnesium 2.4 1.7 - 2.4 mg/dL    Comment: Performed at [facility], [place] [address] [place], [place] [address]  I-Stat beta hCG blood, ED     Status: None   Collection Time: 04/01/17  8:13 PM  Result Value Ref Range   I-stat hCG, quantitative <5.0 <5 mIU/mL   Comment 3            Comment:   GEST. AGE      CONC.  (mIU/mL)   <=1 WEEK        5 - 50     2 WEEKS       50 - 500     3 WEEKS       100 - 10,000     4 WEEKS     1,000 - 30,000        FEMALE AND NON-PREGNANT FEMALE:     LESS THAN 5 mIU/mL   I-Stat CG4 Lactic Acid, ED     Status: None   Collection Time: 04/01/17  8:15 PM  Result Value Ref Range   Lactic Acid, Venous 1.81 0.5 - 1.9 mmol/L  Urinalysis, Routine w reflex microscopic     Status: Abnormal   Collection Time: 04/02/17 12:58 AM  Result Value Ref Range   Color, Urine YELLOW YELLOW   APPearance CLEAR CLEAR   Specific Gravity, Urine 1.024 1.005 - 1.030   pH 6.0 5.0 - 8.0   Glucose, UA NEGATIVE NEGATIVE mg/dL   Hgb urine dipstick NEGATIVE NEGATIVE   Bilirubin Urine NEGATIVE NEGATIVE   Ketones, ur NEGATIVE NEGATIVE mg/dL    Protein, ur NEGATIVE NEGATIVE mg/dL   Nitrite NEGATIVE NEGATIVE   Leukocytes, UA TRACE (A) NEGATIVE   RBC / HPF 0-5 0 - 5 RBC/hpf   WBC, UA 6-30 0 - 5 WBC/hpf   Bacteria, UA RARE (A) NONE SEEN   Squamous Epithelial / LPF 0-5 (A) NONE SEEN    Comment: Performed at [facility], [place] [address], [place], [place] [address]  Osmolality, urine     Status: None   Collection Time: 04/02/17  1:15 AM  Result Value Ref Range   Osmolality, Ur 686 300 - 900 mOsm/kg    Comment: REPEATED TO VERIFY Performed at [facility], [place] [address], [place], [place] [address]   Basic metabolic panel     Status: Abnormal   Collection Time: 04/02/17  3:56 AM  Result Value Ref Range   Sodium 158 (H) 135 - 145 mmol/L   Potassium 2.7 (LL) 3.5 - 5.1 mmol/L    Comment: CRITICAL RESULT CALLED TO, READ BACK BY AND VERIFIED WITH: T DOSTER RN 04/02/17 0445 A NAVARRO    Chloride 121 (H) 101 - 111 mmol/L   CO2 25 22 - 32 mmol/L   Glucose, Bld 125 (H) 65 - 99 mg/dL   BUN 14 6 - 20 mg/dL   Creatinine, Ser 0.86 0.44 - 1.00 mg/dL   Calcium 7.9 (L) 8.9 - 10.3 mg/dL   GFR calc non Af Amer >60 >60 mL/min   GFR calc Af Amer >60 >60 mL/min    Comment: (NOTE) The eGFR has been calculated using the CKD EPI equation. This calculation has not been validated in all clinical situations. eGFR's persistently <60 mL/min signify possible Chronic Kidney Disease.    Anion gap 12 5 - 15    Comment: Performed at [facility], [place] [address], [place], [place] [address]  Influenza panel by PCR (type A & B)     Status: None   Collection Time: 04/02/17  3:58 AM  Result Value Ref Range   Influenza A By PCR NEGATIVE NEGATIVE   Influenza B By PCR NEGATIVE NEGATIVE    Comment: (NOTE) The Xpert Xpress Flu assay is intended as an aid in the diagnosis of  influenza and should not be used as a sole basis for treatment.  This  assay is FDA approved for nasopharyngeal swab specimens only. Nasal   washings and aspirates are unacceptable for Xpert Xpress Flu testing. Performed at [facility], [place] [address] [place], [place] [address]    Dg Chest 2 View  Result Date: 04/01/2017 CLINICAL DATA:  Fever and altered mentation EXAM: CHEST  2 VIEW COMPARISON:  03/07/2017 FINDINGS: Heart and mediastinal contours are stable and within normal limits. Mild central vascular congestion without acute pneumonic consolidation, effusion or pneumothorax. Mild increase in interstitial prominence of the lung bases are stable. No acute osseous abnormality. IMPRESSION: Mild central vascular congestion without acute pneumonic consolidation. Mild  increase in interstitial markings at bases appear chronic likely to reflect changes of mild chronic interstitial disease. Electronically Signed   By: Ashley Royalty M.D.   On: 04/01/2017 23:12   Dg Pelvis 1-2 Views  Result Date: 04/02/2017 CLINICAL DATA:  Pressure ulcer in the sacral area EXAM: PELVIS - 1-2 VIEW COMPARISON:  None. FINDINGS: Limited by positioning. Pubic symphysis and rami are intact. Both femoral heads project in joint. No acute fracture. IMPRESSION: No acute osseous abnormality allowing for positioning. Electronically Signed   By: Donavan Foil M.D.   On: 04/02/2017 03:00    Pending Labs Unresulted Labs (From admission, onward)   Start     Ordered   04/02/17 1610  Basic metabolic panel  Tomorrow morning,   R     04/02/17 0607   04/02/17 0305  Osmolality  Add-on,   R    Comments:  Please Add-on to initial BMP done on admission. Thank You.    04/02/17 0304   04/01/17 2239  Urine culture  STAT,   STAT     04/01/17 2238   04/01/17 2236  Blood culture (routine x 2)  BLOOD CULTURE X 2,   STAT     04/01/17 2238      Vitals/Pain Today's Vitals   04/02/17 1000 04/02/17 1045 04/02/17 1130 04/02/17 1141  BP: 95/72 108/63 (!) 101/58   Pulse: 82 79 78   Resp: 16 20 19    Temp:      TempSrc:      SpO2: 98% 96% 96%   Weight:    210  lb (95.3 kg)  Height:    5' 3"  (1.6 m)    Isolation Precautions Droplet precaution  Medications Medications  dextrose 5 % solution ( Intravenous New Bag/Given 04/01/17 2347)  linezolid (ZYVOX) IVPB 600 mg (0 mg Intravenous Stopped 04/02/17 0640)  levETIRAcetam (KEPPRA) 1,250 mg in sodium chloride 0.9 % 100 mL IVPB (0 mg Intravenous Stopped 04/02/17 0704)  erythromycin ophthalmic ointment ( Right Eye Not Given 04/02/17 0910)  ondansetron (ZOFRAN) tablet 4 mg (not administered)    Or  ondansetron (ZOFRAN) injection 4 mg (not administered)  potassium chloride 10 mEq in 100 mL IVPB (not administered)  heparin injection 5,000 Units (5,000 Units Subcutaneous Given 04/02/17 0906)  LORazepam (ATIVAN) injection 1-2 mg (not administered)  piperacillin-tazobactam (ZOSYN) IVPB 3.375 g (not administered)  potassium chloride 10 mEq in 100 mL IVPB (0 mEq Intravenous Stopped 04/02/17 0217)  erythromycin ophthalmic ointment 1 application (1 application Right Eye Given 04/01/17 2326)  sodium chloride 0.9 % bolus 1,000 mL (0 mLs Intravenous Stopped 04/02/17 0704)  piperacillin-tazobactam (ZOSYN) IVPB 3.375 g (0 g Intravenous Stopped 04/02/17 0503)  potassium chloride 10 mEq in 100 mL IVPB (0 mEq Intravenous Stopped 04/02/17 0850)    Mobility non-ambulatory

## 2017-04-02 NOTE — ED Notes (Signed)
Patient cleaned-repositioned in bed for comfort-fresh external urinary cath placed-bed linen changed-patient has tunneling decubitus to sacral area-dressing not in place-repositioned dressing from wound care nurse and decubitus dressing applied to sacral area-peri care given prior to fresh external urinary cath placement. 2nd PIV placed and blood work drawn.

## 2017-04-02 NOTE — Progress Notes (Signed)
Brooke Palmer, Brooke Palmer ([id number]) Visit Report for 04/01/2017 Chief Complaint Document Details Patient Name: Brooke Palmer Date of Service: 04/01/2017 2:45 PM Medical Record Number: [id number] Patient Account Number: [id number] Date of Birth/Sex: October 09, 1960 (57 y.o. Female) Treating RN: Renne Crigler Primary Care Provider: Katina Dung Other Clinician: Referring Provider: Katina Dung Treating Provider/Extender: Linwood Dibbles, HOYT Weeks in Treatment: 7 Information Obtained from: Patient Chief Complaint Left second toe ulcer and right heel DTI Electronic Signature(s) Signed: 04/01/2017 5:42:41 PM By: Lenda Kelp PA-C Entered By: Lenda Kelp on 04/01/2017 16:04:38 Brooke Palmer ([id number]) -------------------------------------------------------------------------------- HPI Details Patient Name: Brooke Palmer Date of Service: 04/01/2017 2:45 PM Medical Record Number: [id number] Patient Account Number: [id number] Date of Birth/Sex: [birth year] (57 y.o. 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 The good news is the ulcerations on the left lower extremity appear to be doing well with one of the areas over the great toe region actually healed. In regard to the left second toe it appears this to may be healed although I'm not 100% confident in calling this so just based on this initial  evaluation there does not appear to be any drainage however. Subsequently there is also a left heel deep tissue injury which appears to be minimal and does not appear to hurt her at this point which is good news. Unfortunately secondary to her mental status she is unable to rate or describe any discomfort she may be having. She is a resident at a group home. However with the severity of her impairment including the need for a Hoyer lift in order to move her around I am wondering if she should not actually be a skilled patient. No fevers, chills, nausea, or vomiting noted at this time. Patient did previously have pneumonia as well as acute kidney failure when she was most recently in the hospital prior to discharge and then subsequent follow-up with [place]. 02/25/17 the good news is the wounds that I was previously evaluating patient forcing to be doing better and have healed. Unfortunately she has a blister on the left heel and what appears to be a blood blister on the right heel both immediately that are new neither appear to be open in both appear to be dry and intact for the time being. There is no evidence of infection at this point. Patient does not seem to have any significant pain which is good news. 03/18/17 on evaluation today patient appears to be doing decently well in regard to her feet where her blisters seem to be drying out and nothing seems to be worsening. Nonetheless unfortunately she has a quite significant ulceration on the sacral region which is newly evaluated today. According to patient's caregiver the patient does spend quite a significant amount of time sitting up in her chair unfortunately. I think this is going to have to change. She does seem to have discomfort in regard to the sacral wound where she doesn't seem to respond as much in regard to her feet and deep tissue injuries noted there.  03/25/17 On evaluation today [name] ulcer in the sacral region actually appears to  be doing somewhat worse unfortunately. It does appear that she has new injury compared to last week's evaluation which is unfortunate. She also seems to be having a significant amount of discomfort which is also unfortunate. The individual who was with her today during her evaluation generally works night shifts and so does not know what happened during the day as far as sitting up for extended periods of time although it appears that she may be. At least that is my opinion. When she's in the bed it does not sound like she's in the bed where there would be pressure applied to the area where the ulcer seems to be worsening and there's additional injury. No fevers, chills, nausea, or vomiting noted at this time. Patient foot wounds appear to be doing about the same and in some cases a little better. 04/01/17 on evaluation today patient sacral ulcer appears to be significantly worse even compared to last week. It does sound as if upon speaking to the staff that they have been attempting to not leave her up sitting as long and reposition her as far as offloading is concerned as well. They did receive the hospital bed with the air mattress. The Roho cushion is also in place in her wheelchair all of which are things that we recommended. With that being said unfortunately I just really feel that this patient needs placement into a skilled nursing facility more so than a group home. She needs a higher level of care in my opinion in general. With that being said the group home has done the best they can in my opinion as far as attempting to try and address the issues currently that this patient has presented with. Unfortunately today on top of the wound being worse in regard to the sacral region she does have two new areas in regard to her lower extremities these are both blister/potential friction injuries. Nonetheless the other concerning thing is that patient's vital signs were abnormal today with a low  blood pressure, low-grade fever of 100.4, and in general she seems to be a little bit more lethargic than normal although it is difficult to tell due to her baseline altered mental status. I'm not sure if it is her wound which is infected although I think this is a big possibility she could obviously have something else going on such as pneumonia or otherwise although I'm unsure of any symptoms she's exhibited in that regard. KOURTNEY, TERRIQUEZ ([id number]) Electronic Signature(s) Signed: 04/01/2017 5:42:41 PM By: Lenda Kelp PA-C Entered By: Lenda Kelp on 04/01/2017 16:30:08 Brooke Palmer, Brooke Palmer ([id number]) -------------------------------------------------------------------------------- Physical Exam Details Patient Name: Brooke Palmer Date of Service: 04/01/2017 2:45 PM Medical Record Number: [id number] Patient Account Number: [id number] Date of Birth/Sex: [birth year] (57 y.o. Female) Treating RN: Renne Crigler Primary Care Provider: Katina Dung Other Clinician: Referring Provider: Katina Dung Treating Provider/Extender: STONE III, HOYT Weeks in Treatment: 7 Constitutional patient is hypotensive.. pulse regular and within target range for patient.Marland Kitchen respirations regular, non-labored and within target range for patient.. Patient has a fever 100.4. Chronically ill appearing but in no apparent acute distress. Respiratory normal breathing without difficulty. clear to auscultation bilaterally. Cardiovascular regular rate and rhythm with normal S1, S2. Psychiatric Patient is not able to cooperate in decision making regarding care. Patient has dementia. patient is confused. Notes Despite attempted offloading patient sacral wound shows additional pressure injury at this point. This does appear  to be deeper and though still slough covered this seems to be loosening to a degree unfortunately it's just breaking down more. There is erythema surrounding the wound as well. All of this leads  me to be concerned about the possibility of a wound infection that is obviously the big issue being if she were to become septic. Electronic Signature(s) Signed: 04/01/2017 5:42:41 PM By: Lenda Kelp PA-C Entered By: Lenda Kelp on 04/01/2017 16:32:06 Brooke Palmer ([id number]) -------------------------------------------------------------------------------- Physician Orders Details Patient Name: Brooke Palmer Date of Service: 04/01/2017 2:45 PM Medical Record Number: [id number] Patient Account Number: [id number] Date of Birth/Sex: 1960-05-02 (57 y.o. Female) Treating RN: Renne Crigler Primary Care Provider: Katina Dung Other Clinician: Referring Provider: Katina Dung Treating Provider/Extender: Linwood Dibbles, HOYT Weeks in Treatment: 7 Verbal / Phone Orders: No Diagnosis Coding ICD-10 Coding Code Description L89.150 Pressure ulcer of sacral region, unstageable L98.8 Other specified disorders of the skin and subcutaneous tissue R41.841 Cognitive communication deficit R13.13 Dysphagia, pharyngeal phase I82.409 Acute embolism and thrombosis of unspecified deep veins of unspecified lower extremity G40.909 Epilepsy, unspecified, not intractable, without status epilepticus Wound Cleansing Wound #3 Midline Sacrum o Clean wound with Normal Saline. o Clean wound with Normal Saline. Wound #4 Left,Lateral Lower Leg o Clean wound with Normal Saline. o Clean wound with Normal Saline. Wound #5 Right,Dorsal Foot o Clean wound with Normal Saline. Anesthetic (add to Medication List) Wound #3 Midline Sacrum o Topical Lidocaine 4% cream applied to wound bed prior to debridement (In Clinic Only). Wound #4 Left,Lateral Lower Leg o Topical Lidocaine 4% cream applied to wound bed prior to debridement (In Clinic Only). Wound #5 Right,Dorsal Foot o Topical Lidocaine 4% cream applied to wound bed prior to debridement (In Clinic Only). Skin Barriers/Peri-Wound Care Wound  #4 Left,Lateral Lower Leg o Other: - left heel-betadine right heel - betadine . place heel cup on heels bilateral. wrap with kerlix gauze and secure with tape . Apply betadine to blister on left lower leg and on dark area on dorsal right foot. Wound #5 Right,Dorsal Foot o Other: - left heel-betadine right heel - betadine . place heel cup on heels bilateral. wrap with kerlix gauze and secure with tape . Apply betadine to blister on left lower leg and on dark area on dorsal right foot. Primary Wound Dressing Wound #3 Midline Sacrum MADISYN, MAWHINNEY ([id number]) o Santyl Ointment - apply nickel thick to white areas on mid sacral wound, then apply a saline soaked gauze over the santyl, then silvercell on top the wound to cover the rest of the wound area. Secondary Dressing Wound #3 Midline Sacrum o ABD pad - to cover the dressing applied and then secure with Tegaderm dressings to seal and waterproof the wound from urine and feces. The tegaderm should cover the entire dressing to waterproof. Dressing Change Frequency Wound #3 Midline Sacrum o Change dressing every day. Follow-up Appointments Wound #3 Midline Sacrum o Return Appointment in 1 week. Off-Loading Wound #3 Midline Sacrum o Roho cushion for wheelchair o Mattress - Group 2 surface alternating air mattress to offload her sacral wound o Turn and reposition every 2 hours o Other: - No sitting up longer than 2 hour increments to off load from her sacral wound Notes Allen Derry III PA advised patient being admitted to hospital for evaluation of fever of 100.4, wound on sacral appear worse, and blood pressure low. Electronic Signature(s) Signed: 04/01/2017 5:22:46 PM By: Renne Crigler Signed: 04/01/2017 5:42:41 PM By: Lenda Kelp PA-C Previous Signature:  04/01/2017 4:49:06 PM Version By: Renne CriglerFlinchum, Cheryl Entered By: Renne CriglerFlinchum, Cheryl on 04/01/2017 16:52:56 Brooke AngelMARTIN, Brooke Palmer  ([id number]) -------------------------------------------------------------------------------- Problem List Details Patient Name: Brooke AngelMARTIN, Brooke Palmer Date of Service: 04/01/2017 2:45 PM Medical Record Number: [id number] Patient Account Number: [id number] Date of Birth/Sex: 07/10/1960 (57 y.o. Female) Treating RN: Renne CriglerFlinchum, Cheryl Primary Care Provider: Katina DungOYALS, HOOVER Other Clinician: Referring Provider: Katina DungOYALS, HOOVER Treating Provider/Extender: Linwood DibblesSTONE III, HOYT Weeks in Treatment: 7 Active Problems ICD-10 Encounter Code Description Active Date Diagnosis L89.150 Pressure ulcer of sacral region, unstageable 04/01/2017 Yes L98.8 Other specified disorders of the skin and subcutaneous tissue 02/11/2017 Yes R41.841 Cognitive communication deficit 02/11/2017 Yes R13.13 Dysphagia, pharyngeal phase 02/11/2017 Yes I82.409 Acute embolism and thrombosis of unspecified deep veins of 02/11/2017 Yes unspecified lower extremity G40.909 Epilepsy, unspecified, not intractable, without status epilepticus 02/11/2017 Yes Inactive Problems Resolved Problems Electronic Signature(s) Signed: 04/01/2017 5:42:41 PM By: Lenda KelpStone III, Hoyt PA-C Entered By: Lenda KelpStone III, Hoyt on 04/01/2017 16:39:50 Brooke AngelMARTIN, Brooke Palmer ([id number]) -------------------------------------------------------------------------------- Progress Note Details Patient Name: Brooke AngelMARTIN, Brooke Palmer Date of Service: 04/01/2017 2:45 PM Medical Record Number: [id number] Patient Account Number: [id number] Date of Birth/Sex: 02/01/1961 (57 y.o. 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 The good news is the ulcerations on the left lower extremity appear to be doing well with one of the areas over the great toe region actually healed. In regard to the left second toe it appears this to may be healed although I'm not 100% confident in calling this so just based on this initial evaluation there does not appear to be any drainage however. Subsequently there is also a left heel deep tissue injury which appears to be minimal and does not appear to hurt her at this point which is good news. Unfortunately secondary to her mental status she is unable to rate or describe any discomfort she may be having. She is a resident at a group home. However with the severity of her impairment including the need for a Hoyer lift in order to move her around I am wondering if she should not actually be a skilled patient. No fevers, chills, nausea, or vomiting noted at this time. Patient did previously have pneumonia as well as acute kidney failure when she was most recently in the hospital prior to discharge and then subsequent follow-up with us. 02/25/17 the good news is the wounds that I was previously evaluating patient forcing to be doing better and have healed. Unfortunately she has a blister on the left heel and what appears to be a blood blister on the right heel both immediately that are new neither appear to be open in both appear to be dry and intact for the time being. There is no evidence of infection at this point. Patient does not seem to have any significant pain which is good news. 03/18/17 on evaluation today  patient appears to be doing decently well in regard  to her feet where her blisters seem to be drying out and nothing seems to be worsening. Nonetheless unfortunately she has a quite significant ulceration on the sacral region which is newly evaluated today. According to patient's caregiver the patient does spend quite a significant amount of time sitting up in her chair unfortunately. I think this is going to have to change. She does seem to have discomfort in regard to the sacral wound where she doesn't seem to respond as much in regard to her feet and deep tissue injuries noted there. 03/25/17 On evaluation today Mrs. Rattigan's ulcer in the sacral region actually appears to be doing somewhat worse unfortunately. It does appear that she has new injury compared to last week's evaluation which is unfortunate. She also seems to be having a significant amount of discomfort which is also unfortunate. The individual who was with her today during her evaluation generally works night shifts and so does not know what happened during the day as far as sitting up for extended periods of time although it appears that she may be. At least that is my opinion. When she's in the bed it does not sound like she's in the bed where there would be pressure applied to the area where the ulcer seems to be worsening and there's additional injury. No fevers, chills, nausea, or vomiting noted at this time. Patient foot wounds appear to be doing about the same and in some cases a little better. 04/01/17 on evaluation today patient sacral ulcer appears to be significantly worse even compared to last week. It does sound as if upon speaking to the staff that they have been attempting to not leave her up sitting as long and reposition her as far as offloading is concerned as well. They did receive the hospital bed with the air mattress. The Roho cushion is also in place in her wheelchair all of which are things that we recommended.  With that being said unfortunately I just really feel that this patient needs placement into a skilled nursing facility more so than a group home. She needs a higher level of care in my [place], [place] ([id number]) opinion in general. With that being said the group home has done the best they can in my opinion as far as attempting to try and address the issues currently that this patient has presented with. Unfortunately today on top of the wound being worse in regard to the sacral region she does have two new areas in regard to her lower extremities these are both blister/potential friction injuries. Nonetheless the other concerning thing is that patient's vital signs were abnormal today with a low blood pressure, low-grade fever of 100.4, and in general she seems to be a little bit more lethargic than normal although it is difficult to tell due to her baseline altered mental status. I'm not sure if it is her wound which is infected although I think this is a big possibility she could obviously have something else going on such as pneumonia or otherwise although I'm unsure of any symptoms she's exhibited in that regard. Patient History Unable to Obtain Patient History due to Altered Mental Status. Information obtained from Patient. Social History Never smoker, Marital Status - Single, Alcohol Use - Never, Drug Use - No History, Caffeine Use - Never. Medical And Surgical History Notes Respiratory recent hospitalization for aspiration pneumonia and kidney failure Cardiovascular cardiomegaly Neurologic Down Syndrome PTSD Review of Systems (ROS) Constitutional Symptoms (General Health) Denies complaints or symptoms of  Fever, Chills. Respiratory The patient has no complaints or symptoms. Cardiovascular The patient has no complaints or symptoms. Psychiatric The patient has no complaints or symptoms. Objective Constitutional patient is hypotensive.. pulse regular and within target range  for patient.Marland Kitchen respirations regular, non-labored and within target range for patient.. Patient has a fever 100.4. Chronically ill appearing but in no apparent acute distress. Vitals Time Taken: 3:34 AM, Temperature: 100.4 F, Pulse: 87 bpm, Respiratory Rate: 18 breaths/min, Blood Pressure: 96/58 mmHg. Respiratory normal breathing without difficulty. clear to auscultation bilaterally. Cardiovascular regular rate and rhythm with normal S1, S2. Brooke Palmer, Brooke Palmer ([id number]) Psychiatric Patient is not able to cooperate in decision making regarding care. Patient has dementia. patient is confused. General Notes: Despite attempted offloading patient sacral wound shows additional pressure injury at this point. This does appear to be deeper and though still slough covered this seems to be loosening to a degree unfortunately it's just breaking down more. There is erythema surrounding the wound as well. All of this leads me to be concerned about the possibility of a wound infection that is obviously the big issue being if she were to become septic. Integumentary (Hair, Skin) Wound #3 status is Open. Original cause of wound was Pressure Injury. The wound is located on the Midline Sacrum. The wound measures 11.5cm length x 8.5cm width x 1.2cm depth; 76.773cm^2 area and 92.127cm^3 volume. There is Fat Layer (Subcutaneous Tissue) Exposed exposed. There is no tunneling noted, however, there is undermining starting at 10:00 and ending at 2:00 with a maximum distance of 1.9cm. There is a large amount of serosanguineous drainage noted. Foul odor after cleansing was noted. The wound margin is indistinct and nonvisible. There is small (1-33%) red granulation within the wound bed. There is a large (67-100%) amount of necrotic tissue within the wound bed including Eschar and Adherent Slough. The periwound skin appearance exhibited: Excoriation, Scarring, Maceration, Erythema. The periwound skin appearance did not  exhibit: Dry/Scaly. The surrounding wound skin color is noted with erythema which is circumferential. Wound #4 status is Open. Original cause of wound was Blister. The wound is located on the Left,Lateral Lower Leg. The wound measures 1.9cm length x 1.9cm width x 0.1cm depth; 2.835cm^2 area and 0.284cm^3 volume. There is no tunneling or undermining noted. There is a none present amount of drainage noted. The wound margin is flat and intact. There is large (67-100%) red granulation within the wound bed. There is a small (1-33%) amount of necrotic tissue within the wound bed including Adherent Slough. The periwound skin appearance did not exhibit: Callus, Crepitus, Excoriation, Induration, Rash, Scarring, Dry/Scaly, Maceration, Atrophie Blanche, Cyanosis, Ecchymosis, Hemosiderin Staining, Mottled, Pallor, Rubor, Erythema. General Notes: wound appears to be fluid filed bister with red edges Wound #5 status is Open. Original cause of wound was Gradually Appeared. The wound is located on the Right,Dorsal Foot. The wound measures 1.5cm length x 1.5cm width x 0.1cm depth; 1.767cm^2 area and 0.177cm^3 volume. There is no tunneling or undermining noted. There is a small amount of serous drainage noted. The wound margin is flat and intact. There is large (67-100%) red granulation within the wound bed. There is a small (1-33%) amount of necrotic tissue within the wound bed including Eschar. The periwound skin appearance did not exhibit: Callus, Crepitus, Excoriation, Induration, Rash, Scarring, Dry/Scaly, Maceration, Atrophie Blanche, Cyanosis, Ecchymosis, Hemosiderin Staining, Mottled, Pallor, Rubor, Erythema. Periwound temperature was noted as No Abnormality. Assessment Active Problems ICD-10 L89.150 - Pressure ulcer of sacral region, unstageable L98.8 - Other specified  disorders of the skin and subcutaneous tissue R41.841 - Cognitive communication deficit R13.13 - Dysphagia, pharyngeal phase I82.409 -  Acute embolism and thrombosis of unspecified deep veins of unspecified lower extremity G40.909 - Epilepsy, unspecified, not intractable, without status epilepticus Plan [place], Jadon ([id number]) Wound Cleansing: Wound #3 Midline Sacrum: Clean wound with Normal Saline. Clean wound with Normal Saline. Wound #4 Left,Lateral Lower Leg: Clean wound with Normal Saline. Clean wound with Normal Saline. Wound #5 Right,Dorsal Foot: Clean wound with Normal Saline. Anesthetic (add to Medication List): Wound #3 Midline Sacrum: Topical Lidocaine 4% cream applied to wound bed prior to debridement (In Clinic Only). Wound #4 Left,Lateral Lower Leg: Topical Lidocaine 4% cream applied to wound bed prior to debridement (In Clinic Only). Wound #5 Right,Dorsal Foot: Topical Lidocaine 4% cream applied to wound bed prior to debridement (In Clinic Only). Skin Barriers/Peri-Wound Care: Wound #4 Left,Lateral Lower Leg: Other: - left heel-betadine right heel - betadine . place heel cup on heels bilateral. wrap with kerlix gauze and secure with tape . Apply betadine to blister on left lower leg and on dark area on dorsal right foot. Wound #5 Right,Dorsal Foot: Other: - left heel-betadine right heel - betadine . place heel cup on heels bilateral. wrap with kerlix gauze and secure with tape . Apply betadine to blister on left lower leg and on dark area on dorsal right foot. Primary Wound Dressing: Wound #3 Midline Sacrum: Santyl Ointment - apply nickel thick to white areas on mid sacral wound, then apply a saline soaked gauze over the santyl, then silvercell on top the wound to cover the rest of the wound area. Secondary Dressing: Wound #3 Midline Sacrum: ABD pad - to cover the dressing applied and then secure with Tegaderm dressings to seal and waterproof the wound from urine and feces. The tegaderm should cover the entire dressing to waterproof. Dressing Change Frequency: Wound #3 Midline Sacrum: Change  dressing every day. Follow-up Appointments: Wound #3 Midline Sacrum: Return Appointment in 1 week. Off-Loading: Wound #3 Midline Sacrum: Roho cushion for wheelchair Mattress - Group 2 surface alternating air mattress to offload her sacral wound Turn and reposition every 2 hours Other: - No sitting up longer than 2 hour increments to off load from her sacral wound General Notes: Allen Derry III PA advised patient being admitted to hospital for evaluation of fever of 100.4, wound on sacral appear worse, and blood pressure low. Currently I discussed with the individual in charge, Bjorn Loser, at the group home that I believe this patient likely needs evaluation in the ER as soon as possible. We discussed options and they are going to transport the patient to [facility] ER for evaluation and I am sending this note with her as well to provide information. Obviously my concern is the potential for her becoming septic secondary to a wound infection based on today's evaluation. She has a low blood pressure compared to her normal which has run between 115 and 140 typically as far as the systolic pressures are concerned. She also has a low- grade fever and overall worsening of her sacral wound even compared to last week's evaluation. It's possible that any Brooke Palmer, Brooke Palmer ([id number]) infection she may have is coming from her sacral ulcer. Obviously I cannot rule out pneumonia or any other scenario which could also be a possibility hence the reason I would like her at the ER as soon as possible for evaluation. My personal opinion is that post discharge from the ER that this patient be  transferred to a skilled nursing facility as I do not feel like that the group home is not able to provide the level of care that this patient requires. They are doing the best they can at this point but do not seem to have the level of care necessary for this patient's severity in regard to her medical problems. The wounds  alone especially the sacral wound is becoming a very difficult situation for them to manage. Especially given the daily dressing changes necessary at this point. Therefore he would be my strong suggestion based on what I have seen over the past several weeks that this patient needs to be in a skilled nursing facility not back in the group home following discharge from the hospital. Lastly no antibiotics are prescribed for her today as I do want to defer this to the hospital at this point. We will see what they feel like needs to be initiated if anything and will go from there. No lab work was performed in the office today again this is something I'm deferring to the hospital evaluation. We will see the patient back for follow-up evaluation depending on how things progress in the hospital this could vary from next week to sometime later if she is hospitalized for a longer period of time. Nonetheless I do think that she does need ongoing wound care in regard to the sacral wound she may even require surgical debridement potentially in the OR. If this was surgically debrided a Wound VAC would likely be a possibility as well but again all of this would be contingent on how things progress over the next several days/weeks. Electronic Signature(s) Signed: 04/01/2017 5:42:41 PM By: Lenda Kelp PA-C Entered By: Lenda Kelp on 04/01/2017 16:53:37 Brooke Palmer ([id number]) -------------------------------------------------------------------------------- ROS/PFSH Details Patient Name: Brooke Palmer Date of Service: 04/01/2017 2:45 PM Medical Record Number: [id number] Patient Account Number: [id number] Date of Birth/Sex: [birth year] (57 y.o. Female) Treating RN: Renne Crigler Primary Care Provider: Katina Dung Other Clinician: Referring Provider: Katina Dung Treating Provider/Extender: STONE III, HOYT Weeks in Treatment: 7 Unable to Obtain Patient History due to oo Altered Mental  Status Information Obtained From Patient Wound History Do you currently have one or more open woundso Yes How many open wounds do you currently haveo 1 Approximately how long have you had your woundso 1 .5 weeks Has your wound(s) ever healed and then re-openedo Yes Have you had any lab work done in the past montho Yes Who ordered the lab work doneo in hospital Have you tested positive for an antibiotic resistant organism (MRSA, VRE)o No Have you tested positive for osteomyelitis (bone infection)o No Have you had any tests for circulation on your legso No Constitutional Symptoms (General Health) Complaints and Symptoms: Negative for: Fever; Chills Respiratory Complaints and Symptoms: No Complaints or Symptoms Medical History: Positive for: Aspiration - pneumonia resent hospitalization Negative for: Asthma; Chronic Obstructive Pulmonary Disease (COPD); Pneumothorax; Sleep Apnea; Tuberculosis Past Medical History Notes: recent hospitalization for aspiration pneumonia and kidney failure Cardiovascular Complaints and Symptoms: No Complaints or Symptoms Medical History: Positive for: Deep Vein Thrombosis; Hypotension Negative for: Angina; Arrhythmia; Congestive Heart Failure; Coronary Artery Disease; Hypertension; Myocardial Infarction; Peripheral Arterial Disease; Peripheral Venous Disease; Phlebitis; Vasculitis Past Medical History Notes: cardiomegaly Gastrointestinal Medical History: Negative for: Cirrhosis ; Colitis; Crohnos; Hepatitis A; Hepatitis B; Hepatitis C Brooke Palmer, Brooke Palmer ([id number]) Endocrine Medical History: Negative for: Type I Diabetes; Type II Diabetes Genitourinary Medical History: Positive for: End Stage Renal Disease - hospitalization for kidney  failure Immunological Medical History: Negative for: Lupus Erythematosus; Raynaudos; Scleroderma Integumentary (Skin) Medical History: Positive for: History of pressure wounds Negative for: History of  Burn Musculoskeletal Medical History: Negative for: Gout; Rheumatoid Arthritis; Osteoarthritis Neurologic Medical History: Positive for: Dementia; Seizure Disorder Negative for: Neuropathy; Quadriplegia; Paraplegia Past Medical History Notes: Down Syndrome PTSD Psychiatric Complaints and Symptoms: No Complaints or Symptoms Immunizations Pneumococcal Vaccine: Received Pneumococcal Vaccination: Yes Implantable Devices Family and Social History Never smoker; Marital Status - Single; Alcohol Use: Never; Drug Use: No History; Caffeine Use: Never; Financial Concerns: No; Food, Clothing or Shelter Needs: No; Support System Lacking: No; Transportation Concerns: No; Advanced Directives: No; Patient does not want information on Advanced Directives; Do not resuscitate: No; Living Will: No; Medical Power of Attorney: No Physician Affirmation I have reviewed and agree with the above information. Electronic Signature(s) Signed: 04/01/2017 5:22:46 PM By: Renne Crigler Signed: 04/01/2017 5:42:41 PM By: Lovie Macadamia, Adilen ([id number]) Entered By: Lenda Kelp on 04/01/2017 16:30:53 Brooke Palmer, Brooke Palmer ([id number]) -------------------------------------------------------------------------------- SuperBill Details Patient Name: Brooke Palmer Date of Service: 04/01/2017 Medical Record Number: [id number] Patient Account Number: [id number] Date of Birth/Sex: [birth year] (57 y.o. Female) Treating RN: Renne Crigler Primary Care Provider: Katina Dung Other Clinician: Referring Provider: Katina Dung Treating Provider/Extender: Linwood Dibbles, HOYT Weeks in Treatment: 7 Diagnosis Coding ICD-10 Codes Code Description L89.150 Pressure ulcer of sacral region, unstageable L98.8 Other specified disorders of the skin and subcutaneous tissue R41.841 Cognitive communication deficit R13.13 Dysphagia, pharyngeal phase I82.409 Acute embolism and thrombosis of unspecified deep veins of  unspecified lower extremity G40.909 Epilepsy, unspecified, not intractable, without status epilepticus Physician Procedures CPT4 Code: 0865784 Description: 99214 - WC PHYS LEVEL 4 - EST PT ICD-10 Diagnosis Description L89.150 Pressure ulcer of sacral region, unstageable L98.8 Other specified disorders of the skin and subcutaneous t R41.841 Cognitive communication deficit R13.13 Dysphagia,  pharyngeal phase Modifier: issue Quantity: 1 Electronic Signature(s) Signed: 04/01/2017 5:42:41 PM By: Lenda Kelp PA-C Entered By: Lenda Kelp on 04/01/2017 16:40:41

## 2017-04-02 NOTE — Progress Notes (Signed)
Pharmacy Antibiotic Note  Brooke Palmer is a 57 y.o. female admitted on 04/01/2017 with wound infection.  Pharmacy has been consulted for zosyn dosing.  Plan: Zosyn 3.375g IV q8h (4 hour infusion).  Zyvox 600 mg IV q12h (MD > Vanc allergy) F/u scr/cultures    Temp (24hrs), Avg:98.1 F (36.7 C), Min:98.1 F (36.7 C), Max:98.1 F (36.7 C)  Recent Labs  Lab 04/01/17 2003 04/01/17 2015  WBC 13.9*  --   CREATININE 0.84  --   LATICACIDVEN  --  1.81    CrCl cannot be calculated (Unknown ideal weight.).    Allergies  Allergen Reactions  . Vancomycin Hives    Antimicrobials this admission: 2/19 zosyn >>  2/19 zyvox >>   Dose adjustments this admission:   Microbiology results:  BCx:   UCx:    Sputum:    MRSA PCR:   Thank you for allowing pharmacy to be a part of this patient's care.  Brooke Palmer, Brooke Palmer 04/02/2017 2:45 AM

## 2017-04-02 NOTE — H&P (Signed)
History and Physical    Brooke Palmer VHQ:[id number] DOB: 11/23/1960 DOA: 04/01/2017  PCP: Lucretia Fieldoyals, Hoover M   Patient coming from: [facility]  Chief Complaint: lethargy, Fever.  HPI: Brooke Palmer is a 57 y.o. female with medical history significant for mental disability, Jeral PinchDowns, Seizure disorder, hypothyroidism, DVT,  brought to the ED from the wound care center. Employee of the group home present at bedside is not able to give a history, patient at baseline is nonverbal.  She is obtained from chart review and a few notes from wound care center and group home.  Patient's was reportedly lethargic, temperature 100.4 at wound care center, with sacral decubitus ulcer that looked worse.  Patient at baseline does not walk- ambulates with wheelchair.   She was admitted 03/07/2016 for sepsis though to be 2/2 PNA, also consideration for sacral ulcer as etiology but per chart, at that time wound did not appear to be infected.  ED Course: mild intermittent tachycardia to 102, Bp down to 104/61. WBC- 13.9, Na elevated 162, K low at 2.8, normal magnesium, Cr close to baseline 0.84.  Chest x-ray mild central vascular congestion without acute pneumonic consolidation.  She was started on D5 water, given 2 runs of KCL.  Times was called to admit for electrolyte abnormalities possible pneumonia and sacral decubitus ulcer.  Review of Systems: As per HPI otherwise 10 point review of systems negative.  Past Medical History:  Diagnosis Date  . Arthritis    osteo  . DVT (deep venous thrombosis) (HCC)    bilateral legs  . Dysphasia   . Insomnia   . PTSD (post-traumatic stress disorder)   . Seizures (HCC)     History reviewed. No pertinent surgical history.   reports that  has never smoked. she has never used smokeless tobacco. She reports that she does not drink alcohol or use drugs.  Allergies  Allergen Reactions  . Vancomycin Hives    Family History  Family history unknown: Yes     Prior to Admission medications   Medication Sig Start Date End Date Taking? Authorizing Provider  acetic acid (VOSOL) 2 % otic solution Place 4 drops into both ears 2 (two) times daily.   Yes [provider]  acetic acid-hydrocortisone (VOSOL-HC) OTIC solution Place 4 drops into both ears 3 (three) times daily as needed (ear infection prevention).    Yes [provider]  Calcium Carbonate-Vitamin D (OSCAL 500/200 D-3 PO) Take 1 tablet by mouth 2 (two) times daily.    Yes [provider]  carbamide peroxide (DEBROX) 6.5 % OTIC solution Place 5 drops into both ears 2 (two) times daily as needed (ear infection prevention).    Yes [provider]  Cholecalciferol (VITAMIN D3) 2000 units TABS Take 2,000 Units by mouth daily after breakfast.   Yes [provider]  coal tar (NEUTROGENA T-GEL) 0.5 % shampoo Apply 1 application topically at bedtime as needed (scalp, dandruff).    Yes [provider]  collagenase (SANTYL) ointment Apply 1 application topically daily.   Yes [provider]  diazepam (DIASTAT ACUDIAL) 10 MG GEL Place 10 mg rectally daily as needed for seizure.   Yes [provider]  docusate sodium (COLACE) 100 MG capsule Take 100 mg by mouth daily.   Yes [provider]  fludrocortisone (FLORINEF) 0.1 MG tablet Take 0.1 mg by mouth daily. 12/12/16  Yes [provider]  lamoTRIgine (LAMICTAL) 100 MG tablet Take 100 mg by mouth daily. 12/12/16  Yes [provider]  levETIRAcetam (KEPPRA) 250 MG tablet Take 1,250 mg by mouth 2 (two) times daily.  02/26/17  Yes [provider]  levothyroxine (SYNTHROID, LEVOTHROID) 100 MCG tablet Take 100 mcg by mouth daily. 12/12/16  Yes [provider]  Lidocaine-Glycerin (PREPARATION H) 5-14.4 % CREA Place 1 application rectally daily as needed (hemorrhoids).    Yes [provider]  Melatonin 3 MG TABS Take 3 mg by mouth at bedtime.     Yes [provider]  memantine (NAMENDA) 5 MG tablet Take 5 mg by mouth daily.  12/12/16  Yes [provider]  Menthol-Zinc Oxide (RISAMINE) 0.44-20.625 % OINT Apply 1 application topically 2 (two) times daily.   Yes [provider]  montelukast (SINGULAIR) 10 MG tablet Take 10 mg by mouth daily. 12/12/16  Yes [provider]  omeprazole (PRILOSEC) 20 MG capsule Take 20 mg by mouth daily. 12/12/16  Yes [provider]  phenylephrine-shark liver oil-mineral oil-petrolatum (PREPARATION H) 0.25-3-14-71.9 % rectal ointment Place 1 application rectally 2 (two) times daily as needed for hemorrhoids.   Yes [provider]  rivaroxaban (XARELTO) 20 MG TABS tablet Take 20 mg by mouth daily with supper.   Yes [provider]  doxycycline (VIBRAMYCIN) 100 MG capsule Take 1 capsule (100 mg total) by mouth 2 (two) times daily. Patient not taking: Reported on 04/01/2017 03/11/17   Leatha Gilding, MD  erythromycin ophthalmic ointment Place a 1/2 inch ribbon of ointment into the lower eyelid QID for one week. Patient not taking: Reported on 02/18/2017 01/27/17   Alvira Monday, MD  levETIRAcetam (KEPPRA) 1000 MG tablet Take 1 tablet (1,000 mg total) by mouth 2 (two) times daily. Patient not taking: Reported on 02/18/2017 01/27/17   Alvira Monday, MD  potassium chloride SA (K-DUR,KLOR-CON) 20 MEQ tablet Take 1 tablet (20 mEq total) by mouth daily. Patient not taking: Reported on 02/18/2017 02/08/17   Meredith Leeds, MD   Physical Exam: Exam limited by patient's mental status.  Vitals:   04/02/17 0030 04/02/17 0101 04/02/17 0132 04/02/17 0205  BP: 104/60 104/72 104/72 104/61  Pulse: 95 95 92 91  Resp: (!) 25 18 18 19   Temp:      TempSrc:      SpO2: 99% 100% 100% 95%    Constitutional: Sleeping, arousable to touch but lethargic falls back asleep, moans, responding to questions. Vitals:   04/02/17 0030 04/02/17 0101 04/02/17 0132 04/02/17  0205  BP: 104/60 104/72 104/72 104/61  Pulse: 95 95 92 91  Resp: (!) 25 18 18 19   Temp:      TempSrc:      SpO2: 99% 100% 100% 95%   Eyes: PERRL, minimal exudate left eye lid. ENMT: Mucous membranes are  Very dry.  Neck: normal, supple, no masses, no thyromegaly Respiratory: clear to auscultation bilaterally, no wheezing, no crackles. Normal respiratory effort. No accessory muscle use.  Cardiovascular: Regular rate and rhythm, no murmurs / rubs / gallops. No extremity edema. 2+ pedal pulses. Abdomen:  soft, moans throughout exam when aroused, no masses palpated. No hepatosplenomegaly. Bowel sounds positive.  Musculoskeletal: no clubbing / cyanosis.  Skin: large stage 4 at least ~7 by 7 cm ulcer sacral area, between butt cheeks, bone visible at the base, foul odor, some purulence present. Smaller ~2 to ~2.5 circular pressure ulcer  Stage 1- right foot medial aspect, 2 ulcer stage 1 and 2 Left foot lateral aspect.  Neurologic: CN 2-12 grossly intact. Sensation intact, DTR  normal. Strength 5/5 in all 4.  Psychiatric: Normal judgment and insight. Alert and oriented x 3. Normal mood.      Labs on Admission: I have personally reviewed following labs and imaging studies  CBC: Recent Labs  Lab 04/01/17 2003  WBC 13.9*  NEUTROABS 11.2*  HGB 10.6*  HCT 34.3*  MCV 106.5*  PLT 336   Basic Metabolic Panel: Recent Labs  Lab 04/01/17 2003 04/01/17 2009  NA 162*  --   K 2.8*  --   CL 124*  --   CO2 27  --   GLUCOSE 116*  --   BUN 15  --   CREATININE 0.84  --   CALCIUM 8.3*  --   MG  --  2.4   GFR: CrCl cannot be calculated (Unknown ideal weight.). Liver Function Tests: Recent Labs  Lab 04/01/17 2003  AST 17  ALT 12*  ALKPHOS 122  BILITOT 0.5  PROT 7.2  ALBUMIN 2.1*   Urine analysis:    Component Value Date/Time   COLORURINE YELLOW 04/02/2017 0058   APPEARANCEUR CLEAR 04/02/2017 0058   LABSPEC 1.024 04/02/2017 0058   PHURINE 6.0 04/02/2017 0058   GLUCOSEU NEGATIVE  04/02/2017 0058   HGBUR NEGATIVE 04/02/2017 0058   BILIRUBINUR NEGATIVE 04/02/2017 0058   KETONESUR NEGATIVE 04/02/2017 0058   PROTEINUR NEGATIVE 04/02/2017 0058   NITRITE NEGATIVE 04/02/2017 0058   LEUKOCYTESUR TRACE (A) 04/02/2017 0058    Radiological Exams on Admission: Dg Chest 2 View  Result Date: 04/01/2017 CLINICAL DATA:  Fever and altered mentation EXAM: CHEST  2 VIEW COMPARISON:  03/07/2017 FINDINGS: Heart and mediastinal contours are stable and within normal limits. Mild central vascular congestion without acute pneumonic consolidation, effusion or pneumothorax. Mild increase in interstitial prominence of the lung bases are stable. No acute osseous abnormality. IMPRESSION: Mild central vascular congestion without acute pneumonic consolidation. Mild increase in interstitial markings at bases appear chronic likely to reflect changes of mild chronic interstitial disease. Electronically Signed   By: Tollie Eth M.D.   On: 04/01/2017 23:12    EKG: None.   Assessment/Plan Principal Problem:   Sacral decubitus ulcer Active Problems:   Seizure disorder (HCC)   Hypothyroid   DVT (deep venous thrombosis) (HCC)   Hypernatremia   Mental disability  Sepsis 2/2 Sacral decubitus ulcer- fever, leukocytosis.  Mild lactic acid. Recent admission for sepsis thought to be due to bilateral pneumonia, at that time wound did not appear infected.  She likely has osteomyelitis. -Will start IV linezolid and IV Zosyn per pharmacy ( Documented rash to Vancomycin last admission), as pt meets sepsis criteria. -We will start with pelvic x-ray, and pending results may need MRI - ID consult a.m for linezolid approval - Follow-up blood cultures drawn in ED, prior to antibiotics -May benefit for ID and or Ortho evaluation, pending imaging and culture results - Flu check  Hypovolemic hypernatremia-likely chronic, causing lethargy.  Likely due to poor p.o. Intake considering hypokalemia- 2.8, marked dry  mucous membranes. Mag- 2.4.  At this time no reports of fluid loss. D5w started in ED. Calculated free water deficit of 7.5L.  -Continue D5 water for now -Will give 1 L normal saline bolus for volume -Stat BMP now - BMP a.m -Correction 10 mEq in 24 hours - Serum osmolality - Urine osmolality. -Replete lites  Metabolic encephalopathy-likely due to hypernatremia, dehydration, possibly sepsis contributing. - Antibiotics, hydrate  DVT- On rivaroxaban.  - Hold home NOAC for now, pending imaging findings and if biopsy  is planned soon, also with lethargy - Heparin S/q prophylactic dose for now, consider bridging with heparin therapeutic dose if delay in further evaluation  Seizure disorder-medications lamotrigine and Keppra. -Continue home Keppra IV for now pending improvement in mental status -Resume home lamotrignine when mental status improves - Ativan for seizure.  DVT prophylaxis:  Heparin Subcut prophylactic dose  Code Status:  Family Communication: None at bedside. Group home emplpyee at bedside. Records for group home list- [facility] (No exact relationship listed) [phone]. Disposition Plan: >3 days Consults called: None, may need ID, ortho. Admission status: Inpt, tele   Onnie Boer MD [facility] Pager [phone]  If 6PM-3AM, please contact night-coverage [email or web address] Password [facility]  04/02/2017, 2:34 AM

## 2017-04-02 NOTE — Progress Notes (Signed)
Patient is transferred from ED at 1405; hx of Down syndrome; caregiver at bedside.Vital sign was taken. Will monitor patient as protocol.

## 2017-04-02 NOTE — Consult Note (Signed)
[facility]    Date of Admission:  04/01/2017           Day 2 piperacillin tazobactam        Day 2 linezolid       Reason for Consult: Sacral decubitus ulcer    Referring Provider: Dr. Penny Pia  Assessment: It appears that she has a worsening sacral decubitus ulcer and probably has some early wound infection.  She has not been febrile here and does not appear clinically unstable.  I would recommend holding antibiotics for now repeat wound care consult.  It does not appear that she is doing well at her home home.  I think it is most important that a discussion of goals of care take place with her healthcare power of attorney.  I will follow with you.  Plan: 1. Discontinue antibiotics for now 2. Wound care consult 3. Recommend discussion about goals of care  Principal Problem:   Sacral decubitus ulcer Active Problems:   Seizure disorder (HCC)   Hypothyroid   DVT (deep venous thrombosis) (HCC)   Hypernatremia   Anemia   Mental disability   Scheduled Meds: . erythromycin   Right Eye Q8H  . heparin injection (subcutaneous)  5,000 Units Subcutaneous Q8H   Continuous Infusions: . dextrose 125 mL/hr at 04/02/17 1412  . levETIRAcetam Stopped (04/02/17 0704)  . linezolid (ZYVOX) IV Stopped (04/02/17 1529)  . piperacillin-tazobactam (ZOSYN)  IV Stopped (04/02/17 1000)   PRN Meds:.LORazepam, ondansetron **OR** ondansetron (ZOFRAN) IV  HPI: Brooke Palmer is a 57 y.o. female with Down syndrome and developmental disability.  It appears that she entered a group home in the area sometime late last fall because of declining health.  Since that time she has been in the emergency department for lower extremity DVT, recurrent seizures and is now in her third admission to this hospital.  She has developed a large sacral decubitus ulcer.  She has been followed at the wound center in [place].  It appears that this ulcer has developed over the past 2  months.  She was seen at the wound center yesterday where she was noted to have malodorous drainage and low-grade temperature of 100.4 degrees.  She was transferred here and admitted.  It seems as though she is nonambulatory.   Review of Systems: Review of Systems  Unable to perform ROS: Patient nonverbal    Past Medical History:  Diagnosis Date  . Arthritis    osteo  . DVT (deep venous thrombosis) (HCC)    bilateral legs  . Dysphasia   . Insomnia   . PTSD (post-traumatic stress disorder)   . Seizures (HCC)     Social History   Tobacco Use  . Smoking status: Never Smoker  . Smokeless tobacco: Never Used  Substance Use Topics  . Alcohol use: No    Frequency: Never  . Drug use: No    Family History  Family history unknown: Yes   Allergies  Allergen Reactions  . Vancomycin Hives    OBJECTIVE: Blood pressure 115/63, pulse 90, temperature 98.1 F (36.7 C), resp. rate (!) 22, height 5\' 3"  (1.6 m), weight 210 lb (95.3 kg), SpO2 96 %.  Physical Exam  Constitutional:  She is nonverbal.  She moans when being repositioned in bed.  Cardiovascular: Normal rate and regular rhythm.  No murmur heard. Pulmonary/Chest: Effort normal. She has no wheezes. She has no rales.  Abdominal: Soft.  Musculoskeletal:  She has a golf ball sized ulcer just to the right of midline in the gluteal crease.  The base is covered in slough.  She has dark gray discharge with slight malodor.  Skin:  Facial flushing.    Lab Results Lab Results  Component Value Date   WBC 13.9 (H) 04/01/2017   HGB 10.6 (L) 04/01/2017   HCT 34.3 (L) 04/01/2017   MCV 106.5 (H) 04/01/2017   PLT 336 04/01/2017    Lab Results  Component Value Date   CREATININE 0.86 04/02/2017   BUN 14 04/02/2017   NA 158 (H) 04/02/2017   K 2.7 (LL) 04/02/2017   CL 121 (H) 04/02/2017   CO2 25 04/02/2017    Lab Results  Component Value Date   ALT 12 (L) 04/01/2017   AST 17 04/01/2017   ALKPHOS 122 04/01/2017   BILITOT  0.5 04/01/2017     Microbiology: No results found for this or any previous visit (from the past 240 hour(s)).  Cliffton AstersJohn Kashena Novitski, MD [facility] [phone] pager   [phone] cell 04/02/2017, 4:00 PM

## 2017-04-02 NOTE — Progress Notes (Signed)
Patient seen and evaluated earlier this am by my associate. Please refer to H and P for details regarding assessment and plan.  I have consulted ID for assistance with antibiotic selection moving forward.  No new complaints reported at time of visit  Gen: Pt in bed comfortable, in nad CV: no cyanosis Pulm: no increased wob, no wheezes  Brooke Piarlando Jamesen Stahnke MD  Our team to reassess next am.

## 2017-04-02 NOTE — Progress Notes (Addendum)
Brooke Palmer, Pranathi ([id number]) Visit Report for 04/01/2017 Arrival Information Details Patient Name: Brooke Palmer, Brooke Palmer Date of Service: 04/01/2017 2:45 PM Medical Record Number: [id number] Patient Account Number: [id number] Date of Birth/Sex: 12/16/1960 (57 y.o. Female) Treating RN: Renne CriglerFlinchum, Cheryl Primary Care Omarius Grantham: Katina DungOYALS, HOOVER Other Clinician: Referring Trinty Marken: Katina DungOYALS, HOOVER Treating Mallika Sanmiguel/Extender: Linwood DibblesSTONE III, HOYT Weeks in Treatment: 7 Visit Information History Since Last Visit All ordered tests and consults were completed: No Patient Arrived: Wheel Chair Added or deleted any medications: No Arrival Time: 15:32 Any new allergies or adverse reactions: No Accompanied By: caregiver Had a fall or experienced change in No activities of daily living that may affect Transfer Assistance: Michiel SitesHoyer Lift risk of falls: Patient Identification Verified: Yes Signs or symptoms of abuse/neglect since last visito No Secondary Verification Process Completed: Yes Hospitalized since last visit: No Patient Requires Transmission-Based No Pain Present Now: No Precautions: Patient Has Alerts: No Electronic Signature(s) Signed: 04/01/2017 5:22:46 PM By: Renne CriglerFlinchum, Cheryl Entered By: Renne CriglerFlinchum, Cheryl on 04/01/2017 15:32:36 Brooke Palmer, Brooke Palmer ([id number]) -------------------------------------------------------------------------------- Encounter Discharge Information Details Patient Name: Brooke Palmer, Brooke Palmer Date of Service: 04/01/2017 2:45 PM Medical Record Number: [id number] Patient Account Number: [id number] Date of Birth/Sex: 05/18/1960 (57 y.o. Female) Treating RN: Renne CriglerFlinchum, Cheryl Primary Care Tipton Ballow: Katina DungOYALS, HOOVER Other Clinician: Referring Steaven Wholey: Katina DungOYALS, HOOVER Treating Clariza Sickman/Extender: Linwood DibblesSTONE III, HOYT Weeks in Treatment: 7 Encounter Discharge Information Items Discharge Pain Level: 0 Discharge Condition: Stable Ambulatory Status: Wheelchair Other (Note Discharge  Destination: Required) Transportation: Other Accompanied By: caregiver Schedule Follow-up Appointment: No Medication Reconciliation completed and No provided to Patient/Care Jadeyn Hargett: Provided on Clinical Summary of Care: 04/01/2017 Form Type Recipient Paper Patient CM Notes Allen DerryHoyt Stone III referred patient to be taken to hospital. Electronic Signature(s) Signed: 04/02/2017 9:37:04 AM By: Renne CriglerFlinchum, Cheryl Previous Signature: 04/01/2017 4:58:43 PM Version By: Gwenlyn PerkingMoore, Shelia Entered By: Renne CriglerFlinchum, Cheryl on 04/02/2017 09:37:04 Brooke Palmer, Keyshawna ([id number]) -------------------------------------------------------------------------------- Lower Extremity Assessment Details Patient Name: Brooke Palmer, Brooke Palmer Date of Service: 04/01/2017 2:45 PM Medical Record Number: [id number] Patient Account Number: [id number] Date of Birth/Sex: 01/22/1961 (57 y.o. Female) Treating RN: Renne CriglerFlinchum, Cheryl Primary Care Montina Dorrance: Katina DungOYALS, HOOVER Other Clinician: Referring Daran Favaro: Katina DungOYALS, HOOVER Treating Honesti Seaberg/Extender: STONE III, HOYT Weeks in Treatment: 7 Edema Assessment Assessed: [Left: No] [Right: No] Edema: [Left: No] [Right: No] Vascular Assessment Claudication: Claudication Assessment [Left:None] [Right:None] Pulses: Dorsalis Pedis Palpable: [Left:Yes] [Right:Yes] Posterior Tibial Extremity colors, hair growth, and conditions: Extremity Color: [Left:Normal] [Right:Normal] Hair Growth on Extremity: [Left:No] [Right:No] Temperature of Extremity: [Left:Cool] [Right:Cool] Toe Nail Assessment Left: Right: Thick: No No Discolored: No No Deformed: No No Improper Length and Hygiene: No No Electronic Signature(s) Signed: 04/02/2017 9:31:59 AM By: Renne CriglerFlinchum, Cheryl Entered By: Renne CriglerFlinchum, Cheryl on 04/02/2017 09:31:59 Brooke Palmer, Makenize ([id number]) -------------------------------------------------------------------------------- Multi Wound Chart Details Patient Name: Brooke Palmer, Brooke Palmer Date of Service:  04/01/2017 2:45 PM Medical Record Number: [id number] Patient Account Number: [id number] Date of Birth/Sex: 05/26/1960 (57 y.o. Female) Treating RN: Renne CriglerFlinchum, Cheryl Primary Care Caedyn Raygoza: Katina DungOYALS, HOOVER Other Clinician: Referring Cleveland Paiz: Katina DungOYALS, HOOVER Treating Naela Nodal/Extender: STONE III, HOYT Weeks in Treatment: 7 Vital Signs Height(in): Pulse(bpm): 87 Weight(lbs): Blood Pressure(mmHg): 96/58 Body Mass Index(BMI): Temperature(F): 100.4 Respiratory Rate 18 (breaths/min): Photos: [3:No Photos] [4:No Photos] [5:No Photos] Wound Location: [3:Sacrum - Midline] [4:Left Lower Leg - Lateral] [5:Right Foot - Dorsal] Wounding Event: [3:Pressure Injury] [4:Blister] [5:Gradually Appeared] 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] [4:Pressure Ulcer] [5:Pressure Ulcer] Comorbid History: [3:Aspiration, Deep Vein Thrombosis, Hypotension, End Thrombosis, Hypotension, End Thrombosis, Hypotension, End Stage Renal Disease, History Stage Renal Disease, History Stage Renal Disease, History of pressure wounds, Dementia, Seizure  Disorder] [  4:Aspiration, Deep Vein of pressure wounds, Dementia, Seizure Disorder] [5:Aspiration, Deep Vein of pressure wounds, Dementia, Seizure Disorder] Date Acquired: [3:03/04/2017] [4:04/01/2017] [5:04/01/2017] Weeks of Treatment: [3:2] [4:0] [5:0] Wound Status: [3:Open] [4:Open] [5:Open] Measurements L x W x D [3:11.5x8.5x1.2] [4:1.9x1.9x0.1] [5:1.5x1.5x0.1] (cm) Area (cm) : [3:76.773] [4:2.835] [5:1.767] Volume (cm) : [3:92.127] [4:0.284] [5:0.177] % Reduction in Area: [3:-29.20%] [4:0.00%] [5:0.00%] % Reduction in Volume: [3:-72.30%] [4:0.00%] [5:0.00%] Starting Position 1 [3:10] (o'clock): Ending Position 1 [3:2] (o'clock): Maximum Distance 1 (cm): [3:1.9] Undermining: [3:Yes] [4:No] [5:No] Classification: [3:Category/Stage IV] [4:Unstageable/Unclassified] [5:Unstageable/Unclassified] Exudate Amount: [3:Large] [4:None Present] [5:Small] Exudate Type:  [3:Serosanguineous] [4:N/A] [5:Serous] Exudate Color: [3:red, brown] [4:N/A] [5:amber] Foul Odor After Cleansing: [3:Yes] [4:N/A] [5:No] Odor Anticipated Due to [3:No] [4:N/A] [5:N/A] Product Use: Wound Margin: [3:Indistinct, nonvisible] [4:Flat and Intact] [5:Flat and Intact] Granulation Amount: [3:Small (1-33%)] [4:Large (67-100%)] [5:Large (67-100%)] Granulation Quality: [3:Red] [4:Red] [5:Red] Necrotic Amount: [3:Large (67-100%)] [4:Small (1-33%)] [5:Small (1-33%)] Necrotic Tissue: [3:Eschar, Adherent Slough] [4:Adherent Slough] [5:Eschar] Exposed Structures: [4:N/A] [5:N/A] Fat Layer (Subcutaneous Tissue) Exposed: Yes Epithelialization: None Large (67-100%) Medium (34-66%) Periwound Skin Texture: Excoriation: Yes Excoriation: No Excoriation: No Scarring: Yes Induration: No Induration: No Callus: No Callus: No Crepitus: No Crepitus: No Rash: No Rash: No Scarring: No Scarring: No Periwound Skin Moisture: Maceration: Yes Maceration: No Maceration: No Dry/Scaly: No Dry/Scaly: No Dry/Scaly: No Periwound Skin Color: Erythema: Yes Atrophie Blanche: No Atrophie Blanche: No Cyanosis: No Cyanosis: No Ecchymosis: No Ecchymosis: No Erythema: No Erythema: No Hemosiderin Staining: No Hemosiderin Staining: No Mottled: No Mottled: No Pallor: No Pallor: No Rubor: No Rubor: No Erythema Location: Circumferential N/A N/A Temperature: N/A N/A No Abnormality Tenderness on Palpation: No No No Wound Preparation: Ulcer Cleansing: Ulcer Cleansing: Ulcer Cleansing: Rinsed/Irrigated with Saline Rinsed/Irrigated with Saline Rinsed/Irrigated with Saline Topical Anesthetic Applied: Topical Anesthetic Applied: Topical Anesthetic Applied: Other: lidocaine 4% Other: lidocaine 4% Other: lidocaine 4% Assessment Notes: N/A wound appears to be fluid filed N/A bister with red edges Treatment Notes Electronic Signature(s) Signed: 04/01/2017 4:46:38 PM By: Renne Crigler Entered  By: Renne Crigler on 04/01/2017 16:46:37 Brooke Palmer ([id number]) -------------------------------------------------------------------------------- Multi-Disciplinary Care Plan Details Patient Name: Brooke Palmer Date of Service: 04/01/2017 2:45 PM Medical Record Number: [id number] Patient Account Number: [id number] Date of Birth/Sex: 07/26/1960 (57 y.o. Female) Treating RN: Renne Crigler Primary Care Brandon Scarbrough: Katina Dung Other Clinician: Referring Ryden Wainer: Katina Dung Treating Hadli Vandemark/Extender: Linwood Dibbles, HOYT Weeks in Treatment: 7 Active Inactive ` Orientation to the Wound Care Program Nursing Diagnoses: Knowledge deficit related to the wound healing center program Goals: Patient/caregiver will verbalize understanding of the Wound Healing Center Program Date Initiated: 02/11/2017 Target Resolution Date: 03/14/2017 Goal Status: Active Interventions: Provide education on orientation to the wound center Notes: ` Wound/Skin Impairment Nursing Diagnoses: Impaired tissue integrity Knowledge deficit related to ulceration/compromised skin integrity Goals: Patient/caregiver will verbalize understanding of skin care regimen Date Initiated: 02/11/2017 Target Resolution Date: 03/14/2017 Goal Status: Active Ulcer/skin breakdown will have a volume reduction of 30% by week 4 Date Initiated: 02/11/2017 Target Resolution Date: 03/14/2017 Goal Status: Active Interventions: Assess patient/caregiver ability to perform ulcer/skin care regimen upon admission and as needed Assess ulceration(s) every visit Treatment Activities: Skin care regimen initiated : 02/11/2017 Notes: Electronic Signature(s) Signed: 04/02/2017 9:32:08 AM By: Renne Crigler Previous Signature: 04/01/2017 4:46:26 PM Version By: Ammie Ferrier, Eber Jones ([id number]) Entered By: Renne Crigler on 04/02/2017 09:32:07 Brooke Palmer  ([id number]) -------------------------------------------------------------------------------- Pain Assessment Details Patient Name: Brooke Palmer Date of Service: 04/01/2017 2:45 PM Medical Record Number: [id number] Patient Account Number: [id number] Date  of Birth/Sex: March 30, 1960 (57 y.o. Female) Treating RN: Renne Crigler Primary Care Keyston Ardolino: Katina Dung Other Clinician: Referring Bartholomew Ramesh: Katina Dung Treating Raymel Cull/Extender: Linwood Dibbles, HOYT Weeks in Treatment: 7 Active Problems Location of Pain Severity and Description of Pain Patient Has Paino No Site Locations Pain Management and Medication Current Pain Management: Electronic Signature(s) Signed: 04/01/2017 5:22:46 PM By: Renne Crigler Entered By: Renne Crigler on 04/01/2017 15:32:47 Brooke Palmer ([id number]) -------------------------------------------------------------------------------- Patient/Caregiver Education Details Patient Name: Brooke Palmer Date of Service: 04/01/2017 2:45 PM Medical Record Number: [id number] Patient Account Number: [id number] Date of Birth/Gender: [birth year] (57 y.o. Female) Treating RN: Renne Crigler Primary Care Physician: Katina Dung Other Clinician: Referring Physician: Katina Dung Treating Physician/Extender: Skeet Simmer in Treatment: 7 Education Assessment Education Provided To: Patient and Caregiver Orders faxed to group home- [facility] Topics Provided Wound/Skin Impairment: Handouts: Caring for Your Ulcer Methods: Explain/Verbal Responses: State content correctly Electronic Signature(s) Signed: 04/02/2017 2:14:45 PM By: Renne Crigler Entered By: Renne Crigler on 04/02/2017 09:37:29 Brooke Palmer ([id number]) -------------------------------------------------------------------------------- Wound Assessment Details Patient Name: Brooke Palmer Date of Service: 04/01/2017 2:45 PM Medical Record Number: [id number] Patient Account  Number: [id number] Date of Birth/Sex: 05/13/1960 (57 y.o. Female) Treating RN: Renne Crigler Primary Care Breylan Lefevers: Katina Dung Other Clinician: Referring Jaikob Borgwardt: Katina Dung Treating Brock Larmon/Extender: STONE III, HOYT Weeks in Treatment: 7 Wound Status Wound Number: 3 Primary Pressure Ulcer Etiology: Wound Location: Sacrum - Midline Wound Open Wounding Event: Pressure Injury Status: Date Acquired: 03/04/2017 Comorbid Aspiration, Deep Vein Thrombosis, Hypotension, Weeks Of Treatment: 2 History: End Stage Renal Disease, History of pressure Clustered Wound: No wounds, Dementia, Seizure Disorder Photos Photo Uploaded By: Renne Crigler on 04/02/2017 07:38:15 Wound Measurements Length: (cm) 11.5 Width: (cm) 8.5 Depth: (cm) 1.2 Area: (cm) 76.773 Volume: (cm) 92.127 % Reduction in Area: -29.2% % Reduction in Volume: -72.3% Epithelialization: None Tunneling: No Undermining: Yes Starting Position (o'clock): 10 Ending Position (o'clock): 2 Maximum Distance: (cm) 1.9 Wound Description Classification: Category/Stage IV Wound Margin: Indistinct, nonvisible Exudate Amount: Large Exudate Type: Serosanguineous Exudate Color: red, brown Foul Odor After Cleansing: Yes Due to Product Use: No Slough/Fibrino Yes Wound Bed Granulation Amount: Small (1-33%) Exposed Structure Granulation Quality: Red Fat Layer (Subcutaneous Tissue) Exposed: Yes Necrotic Amount: Large (67-100%) Necrotic Quality: Eschar, Adherent [place], Niajah ([id number]) Periwound Skin Texture Texture Color No Abnormalities Noted: No No Abnormalities Noted: No Excoriation: Yes Erythema: Yes Scarring: Yes Erythema Location: Circumferential Moisture No Abnormalities Noted: No Dry / Scaly: No Maceration: Yes Wound Preparation Ulcer Cleansing: Rinsed/Irrigated with Saline Topical Anesthetic Applied: Other: lidocaine 4%, Treatment Notes Wound #3 (Midline Sacrum) 1. Cleansed with: Clean  wound with Normal Saline 2. Anesthetic Topical Lidocaine 4% cream to wound bed prior to debridement 4. Dressing Applied: Other dressing (specify in notes) 5. Secondary Dressing Applied ABD Pad Dry Gauze Notes secure with tape Electronic Signature(s) Signed: 04/01/2017 5:22:46 PM By: Renne Crigler Entered By: Renne Crigler on 04/01/2017 16:01:18 Brooke Palmer ([id number]) -------------------------------------------------------------------------------- Wound Assessment Details Patient Name: Brooke Palmer Date of Service: 04/01/2017 2:45 PM Medical Record Number: [id number] Patient Account Number: [id number] Date of Birth/Sex: 1960-12-09 (57 y.o. Female) Treating RN: Renne Crigler Primary Care Aamari Strawderman: Katina Dung Other Clinician: Referring Elizabella Nolet: Katina Dung Treating Troy Hartzog/Extender: STONE III, HOYT Weeks in Treatment: 7 Wound Status Wound Number: 4 Primary Pressure Ulcer Etiology: Wound Location: Left Lower Leg - Lateral Wound Open Wounding Event: Blister Status: Date Acquired: 04/01/2017 Comorbid Aspiration, Deep Vein Thrombosis, Hypotension, Weeks Of Treatment: 0 History: End Stage Renal Disease, History of pressure Clustered  Wound: No wounds, Dementia, Seizure Disorder Photos Photo Uploaded By: Renne Crigler on 04/02/2017 07:38:38 Wound Measurements Length: (cm) 1.9 Width: (cm) 1.9 Depth: (cm) 0.1 Area: (cm) 2.835 Volume: (cm) 0.284 % Reduction in Area: 0% % Reduction in Volume: 0% Epithelialization: Large (67-100%) Tunneling: No Undermining: No Wound Description Classification: Unstageable/Unclassified Wound Margin: Flat and Intact Exudate Amount: None Present Wound Bed Granulation Amount: Large (67-100%) Granulation Quality: Red Necrotic Amount: Small (1-33%) Necrotic Quality: Adherent Slough Periwound Skin Texture Texture Color No Abnormalities Noted: No No Abnormalities Noted: No Callus: No Atrophie Blanche: No Crepitus:  No Cyanosis: No Excoriation: No Ecchymosis: No LUDMILA, EBARB ([id number]) Induration: No Erythema: No Rash: No Hemosiderin Staining: No Scarring: No Mottled: No Pallor: No Moisture Rubor: No No Abnormalities Noted: No Dry / Scaly: No Maceration: No Wound Preparation Ulcer Cleansing: Rinsed/Irrigated with Saline Topical Anesthetic Applied: Other: lidocaine 4%, Assessment Notes wound appears to be fluid filed bister with red edges Treatment Notes Wound #4 (Left, Lateral Lower Leg) 1. Cleansed with: Clean wound with Normal Saline 2. Anesthetic Topical Lidocaine 4% cream to wound bed prior to debridement 4. Dressing Applied: Other dressing (specify in notes) 5. Secondary Dressing Applied Bordered Foam Dressing Notes betadine Electronic Signature(s) Signed: 04/01/2017 5:22:46 PM By: Renne Crigler Entered By: Renne Crigler on 04/01/2017 16:03:49 Brooke Palmer ([id number]) -------------------------------------------------------------------------------- Wound Assessment Details Patient Name: Brooke Palmer Date of Service: 04/01/2017 2:45 PM Medical Record Number: [id number] Patient Account Number: [id number] Date of Birth/Sex: [birth year] (57 y.o. Female) Treating RN: Renne Crigler Primary Care Arick Mareno: Katina Dung Other Clinician: Referring Zeev Deakins: Katina Dung Treating Merlean Pizzini/Extender: STONE III, HOYT Weeks in Treatment: 7 Wound Status Wound Number: 5 Primary Pressure Ulcer Etiology: Wound Location: Right Foot - Dorsal Wound Open Wounding Event: Gradually Appeared Status: Date Acquired: 04/01/2017 Comorbid Aspiration, Deep Vein Thrombosis, Hypotension, Weeks Of Treatment: 0 History: End Stage Renal Disease, History of pressure Clustered Wound: No wounds, Dementia, Seizure Disorder Photos Photo Uploaded By: Renne Crigler on 04/02/2017 07:38:38 Wound Measurements Length: (cm) 1.5 Width: (cm) 1.5 Depth: (cm) 0.1 Area: (cm)  1.767 Volume: (cm) 0.177 % Reduction in Area: 0% % Reduction in Volume: 0% Epithelialization: Medium (34-66%) Tunneling: No Undermining: No Wound Description Classification: Unstageable/Unclassified Wound Margin: Flat and Intact Exudate Amount: Small Exudate Type: Serous Exudate Color: amber Foul Odor After Cleansing: No Slough/Fibrino Yes Wound Bed Granulation Amount: Large (67-100%) Granulation Quality: Red Necrotic Amount: Small (1-33%) Necrotic Quality: Eschar Periwound Skin Texture Texture Color No Abnormalities Noted: No No Abnormalities Noted: No Callus: No Atrophie Blanche: No SANJUANITA, CONDREY ([id number]) Crepitus: No Cyanosis: No Excoriation: No Ecchymosis: No Induration: No Erythema: No Rash: No Hemosiderin Staining: No Scarring: No Mottled: No Pallor: No Moisture Rubor: No No Abnormalities Noted: No Dry / Scaly: No Temperature / Pain Maceration: No Temperature: No Abnormality Wound Preparation Ulcer Cleansing: Rinsed/Irrigated with Saline Topical Anesthetic Applied: Other: lidocaine 4%, Treatment Notes Wound #5 (Right, Dorsal Foot) 1. Cleansed with: Clean wound with Normal Saline 2. Anesthetic Topical Lidocaine 4% cream to wound bed prior to debridement 4. Dressing Applied: Other dressing (specify in notes) 5. Secondary Dressing Applied Bordered Foam Dressing Notes betadine Electronic Signature(s) Signed: 04/01/2017 5:22:46 PM By: Renne Crigler Entered By: Renne Crigler on 04/01/2017 16:04:48 Brooke Palmer ([id number]) -------------------------------------------------------------------------------- Vitals Details Patient Name: Brooke Palmer Date of Service: 04/01/2017 2:45 PM Medical Record Number: [id number] Patient Account Number: [id number] Date of Birth/Sex: [birth year] (57 y.o. Female) Treating RN: Renne Crigler Primary Care Jaequan Propes: Katina Dung Other Clinician: Referring Stacie Knutzen: Katina Dung Treating  Aliyha Fornes/Extender: Larina Bras  III, HOYT Weeks in Treatment: 7 Vital Signs Time Taken: 03:34 Temperature (F): 100.4 Pulse (bpm): 87 Respiratory Rate (breaths/min): 18 Blood Pressure (mmHg): 96/58 Reference Range: 80 - 120 mg / dl Electronic Signature(s) Signed: 04/01/2017 5:22:46 PM By: Renne Crigler Entered By: Renne Crigler on 04/01/2017 15:34:12

## 2017-04-02 NOTE — ED Notes (Signed)
Date and time results received: 04/02/17 0439  Test: Potassium 2.7 Critical Value: 2.7  Name of Provider Notified:Mid level Craige CottaKirby  Orders Received? Or Actions Taken?: another run of potassium ordered for total 4 runs

## 2017-04-03 ENCOUNTER — Other Ambulatory Visit: Payer: Self-pay

## 2017-04-03 DIAGNOSIS — E038 Other specified hypothyroidism: Secondary | ICD-10-CM

## 2017-04-03 LAB — URINE CULTURE

## 2017-04-03 LAB — BASIC METABOLIC PANEL
Anion gap: 10 (ref 5–15)
BUN: 8 mg/dL (ref 6–20)
CO2: 21 mmol/L — ABNORMAL LOW (ref 22–32)
Calcium: 7.7 mg/dL — ABNORMAL LOW (ref 8.9–10.3)
Chloride: 114 mmol/L — ABNORMAL HIGH (ref 101–111)
Creatinine, Ser: 0.75 mg/dL (ref 0.44–1.00)
GFR calc Af Amer: >60 mL/min (ref >60)
GFR calc non Af Amer: >60 mL/min (ref >60)
Glucose, Bld: 105 mg/dL — ABNORMAL HIGH (ref 65–99)
Potassium: 2.5 mmol/L — CL (ref 3.5–5.1)
Sodium: 145 mmol/L (ref 135–145)

## 2017-04-03 MED ORDER — POTASSIUM CHLORIDE 10 MEQ/100ML IV SOLN
10.0000 meq | INTRAVENOUS | Status: AC
  Administered 2017-04-03 (×3): 10 meq via INTRAVENOUS
  Filled 2017-04-03 (×3): qty 100

## 2017-04-03 MED ORDER — LEVOTHYROXINE SODIUM 100 MCG PO TABS
100.0000 ug | ORAL_TABLET | Freq: Every day | ORAL | Status: DC
  Administered 2017-04-04 – 2017-04-08 (×5): 100 ug via ORAL
  Filled 2017-04-03 (×8): qty 1

## 2017-04-03 MED ORDER — DEXTROSE-NACL 5-0.45 % IV SOLN
INTRAVENOUS | Status: DC
  Administered 2017-04-04 – 2017-04-09 (×7): via INTRAVENOUS

## 2017-04-03 MED ORDER — RIVAROXABAN 20 MG PO TABS
20.0000 mg | ORAL_TABLET | Freq: Every day | ORAL | Status: DC
  Administered 2017-04-03 – 2017-04-10 (×7): 20 mg via ORAL
  Filled 2017-04-03 (×11): qty 1

## 2017-04-03 MED ORDER — DEXTROSE-NACL 5-0.45 % IV SOLN
INTRAVENOUS | Status: DC
  Filled 2017-04-03: qty 1000

## 2017-04-03 MED ORDER — FLUDROCORTISONE ACETATE 0.1 MG PO TABS
0.1000 mg | ORAL_TABLET | Freq: Every day | ORAL | Status: DC
  Administered 2017-04-03 – 2017-04-09 (×7): 0.1 mg via ORAL
  Filled 2017-04-03 (×8): qty 1

## 2017-04-03 MED ORDER — MEMANTINE HCL 10 MG PO TABS
5.0000 mg | ORAL_TABLET | Freq: Every day | ORAL | Status: DC
  Administered 2017-04-03 – 2017-04-09 (×7): 5 mg via ORAL
  Filled 2017-04-03 (×8): qty 1

## 2017-04-03 MED ORDER — COLLAGENASE 250 UNIT/GM EX OINT
TOPICAL_OINTMENT | Freq: Every day | CUTANEOUS | Status: DC
  Administered 2017-04-03 – 2017-04-09 (×7): via TOPICAL
  Administered 2017-04-10: 1 via TOPICAL
  Filled 2017-04-03 (×2): qty 90

## 2017-04-03 NOTE — Progress Notes (Signed)
Patient ID: Brooke Palmer, female   DOB: 07/08/1960, 57 y.o.   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   However, I do not think that she is or was septic upon admission.  She has been in rapidly declining health with multiple ED visits and admissions over the past several months.  I believe that it would be very helpful to have a decision about goals of care.  If aggressive treatment continues to be the plan I would recommend general surgery evaluation for possible debridement, biopsy and cultures before deciding on a lengthy course of antibiotic therapy.  She may benefit from a pelvic MRI.           Cliffton AstersJohn Temima Kutsch, MD [facility] [phone] pager   [phone] 04/03/2017, 4:59 PM

## 2017-04-03 NOTE — Progress Notes (Addendum)
[facility]  PROGRESS NOTE  Brooke Palmer ZOX:[id number] DOB: 09-21-1960 DOA: 04/01/2017 PCP: Lucretia Field   Brief HPI:     57 y.o. female with medical history significant for mental disability, [name], Seizure disorder, hypothyroidism, DVT,  brought to the ED from the wound care center. Employee of the group home present at bedside is not able to give a history, patient at baseline is nonverbal.  She is obtained from chart review and a few notes from wound care center and group home.  Patient's was reportedly lethargic, temperature 100.4 at wound care center, with sacral decubitus ulcer that looked worse.  Patient at baseline does not walk- ambulates with wheelchair.   She was admitted 03/07/2016 for sepsis though to be 2/2 PNA, also consideration for sacral ulcer as etiology but per chart, at that time wound did not appear to be infected.     Subjective   Patient seen and examined, continues to be lethargic.   Assessment/Plan:     1. Sepsis- secondary to sacral decubitus ulcer, patient  was started on linezolid and Zosyn per pharmacy. Pelvic x-ray shows no acute osseous abnormality. ID was consulted, antibiotics were discontinued by ID. Recommend goals of care due to worsening clinical status over past few months. Patient has had three admissions over past two months. Continue local wound care. 2. Altered mental status-patient has history of Down syndrome with developmental delay. Likely altered mental status worsening due to the infection as above. 3. Hypokalemia- replace potassium and check BMP in am. 4. Hypernatremia-sodium is improved 145 today. Will change IV fluids to D 5/2 normal saline at 75 ML per hour. Follow BMP in am. 5. DVT- will restart  Xarelto. 6. Seizure disorder-continue IV Keppra, Lamictal 7. Goals of care-called and discussed with patient's legal guardianMarilyn Drennen , she agrees for goals of care meeting. Will order palliative care  consultation      DVT prophylaxis: Heparin  Code Status: Full code  Family Communication: Discussed with Legal guardian on phone  Disposition Plan: TBD   Consultants:  ID  Procedures:  None   Continuous infusions . dextrose 125 mL/hr at 04/03/17 1304  . levETIRAcetam Stopped (04/03/17 0743)  . potassium chloride Stopped (04/03/17 1353)      Antibiotics:   Anti-infectives (From admission, onward)   Start     Dose/Rate Route Frequency Ordered Stop   04/02/17 1000  piperacillin-tazobactam (ZOSYN) IVPB 3.375 g  Status:  Discontinued     3.375 g 12.5 mL/hr over 240 Minutes Intravenous Every 8 hours 04/02/17 0435 04/02/17 1609   04/02/17 0245  piperacillin-tazobactam (ZOSYN) IVPB 3.375 g     3.375 g 100 mL/hr over 30 Minutes Intravenous NOW 04/02/17 0244 04/02/17 0503   04/02/17 0230  linezolid (ZYVOX) IVPB 600 mg  Status:  Discontinued     600 mg 300 mL/hr over 60 Minutes Intravenous Every 12 hours 04/02/17 0226 04/02/17 1609       Objective   Vitals:   04/02/17 1300 04/02/17 1350 04/02/17 2113 04/03/17 0502  BP: 101/60 115/63 94/79 115/67  Pulse: 84 90 (!) 106 (!) 105  Resp: (!) 25 (!) 22 (!) 24 (!) 24  Temp:  98.1 F (36.7 C) 99.6 F (37.6 C) 97.9 F (36.6 C)  TempSrc:   Oral Oral  SpO2: 96% 96% 96% 95%  Weight:      Height:        Intake/Output Summary (Last 24 hours) at 04/03/2017 1400 Last data filed at 04/03/2017 1304 Gross per  24 hour  Intake 3883.32 ml  Output 200 ml  Net 3683.32 ml   Filed Weights   04/02/17 1141  Weight: 95.3 kg (210 lb)     Physical Examination:   Physical Exam: Eyes: No icterus, extraocular muscles intact  Mouth: Oral mucosa is moist, no lesions on palate,  Neck: Supple, no deformities, masses, or tenderness Lungs: Normal respiratory effort, bilateral clear to auscultation, no crackles or wheezes.  Heart: Regular rate and rhythm, S1 and S2 normal, no murmurs, rubs auscultated Abdomen: BS  normoactive,soft,nondistended,non-tender to palpation,no organomegaly Extremities: No pretibial edema, no erythema, no cyanosis, no clubbing Neuro : lethargic, moving all extremities  Skin:        Data Reviewed: I have personally reviewed following labs and imaging studies  CBG: No results for input(s): GLUCAP in the last 168 hours.  CBC: Recent Labs  Lab 04/01/17 2003  WBC 13.9*  NEUTROABS 11.2*  HGB 10.6*  HCT 34.3*  MCV 106.5*  PLT 336    Basic Metabolic Panel: Recent Labs  Lab 04/01/17 2003 04/01/17 2009 04/02/17 0356 04/03/17 0609  NA 162*  --  158* 145  K 2.8*  --  2.7* 2.5*  CL 124*  --  121* 114*  CO2 27  --  25 21*  GLUCOSE 116*  --  125* 105*  BUN 15  --  14 8  CREATININE 0.84  --  0.86 0.75  CALCIUM 8.3*  --  7.9* 7.7*  MG  --  2.4  --   --     Recent Results (from the past 240 hour(s))  Blood culture (routine x 2)     Status: None (Preliminary result)   Collection Time: 04/01/17 11:39 PM  Result Value Ref Range Status   Specimen Description   Final    BLOOD LEFT ARM Performed at [facility], [address] [place], [place] [address]    Special Requests   Final    BOTTLES DRAWN AEROBIC AND ANAEROBIC Blood Culture adequate volume Performed at [facility], [address] [place], [place] [address]    Culture   Final    NO GROWTH 1 DAY Performed at [facility], [address] [place], [place] [address]    Report Status PENDING  Incomplete  Urine culture     Status: Abnormal   Collection Time: 04/02/17 12:58 AM  Result Value Ref Range Status   Specimen Description   Final    URINE, RANDOM Performed at [facility], [address] [place], [place] [address]    Special Requests   Final    NONE Performed at [facility], [address] [place], [place] [address]    Culture MULTIPLE SPECIES PRESENT, SUGGEST RECOLLECTION (A)  Final   Report Status  04/03/2017 FINAL  Final  Blood culture (routine x 2)     Status: None (Preliminary result)   Collection Time: 04/02/17  3:58 AM  Result Value Ref Range Status   Specimen Description   Final    BLOOD LEFT WRIST Performed at [facility], [address] [place], [place] [address]    Special Requests   Final    IN PEDIATRIC BOTTLE Blood Culture adequate volume Performed at [facility], [address] [place], [place] [address]    Culture   Final    NO GROWTH 1 DAY Performed at [facility], [address] [place], [place] [address]  Report Status PENDING  Incomplete     Liver Function Tests: Recent Labs  Lab 04/01/17 2003  AST 17  ALT 12*  ALKPHOS 122  BILITOT 0.5  PROT 7.2  ALBUMIN 2.1*   No results for input(s): LIPASE, AMYLASE in the last 168 hours. No results for input(s): AMMONIA in the last 168 hours.  Cardiac Enzymes: No results for input(s): CKTOTAL, CKMB, CKMBINDEX, TROPONINI in the last 168 hours. BNP (last 3 results) Recent Labs    02/02/17 2131  BNP 89.7    ProBNP (last 3 results) No results for input(s): PROBNP in the last 8760 hours.    Studies: Dg Chest 2 View  Result Date: 04/01/2017 CLINICAL DATA:  Fever and altered mentation EXAM: CHEST  2 VIEW COMPARISON:  03/07/2017 FINDINGS: Heart and mediastinal contours are stable and within normal limits. Mild central vascular congestion without acute pneumonic consolidation, effusion or pneumothorax. Mild increase in interstitial prominence of the lung bases are stable. No acute osseous abnormality. IMPRESSION: Mild central vascular congestion without acute pneumonic consolidation. Mild increase in interstitial markings at bases appear chronic likely to reflect changes of mild chronic interstitial disease. Electronically Signed   By: Tollie Eth M.D.   On: 04/01/2017 23:12   Dg Pelvis 1-2 Views  Result Date: 04/02/2017 CLINICAL DATA:  Pressure ulcer in the  sacral area EXAM: PELVIS - 1-2 VIEW COMPARISON:  None. FINDINGS: Limited by positioning. Pubic symphysis and rami are intact. Both femoral heads project in joint. No acute fracture. IMPRESSION: No acute osseous abnormality allowing for positioning. Electronically Signed   By: Jasmine Pang M.D.   On: 04/02/2017 03:00    Scheduled Meds: . erythromycin   Right Eye Q8H  . heparin injection (subcutaneous)  5,000 Units Subcutaneous Q8H      Time spent: 35 min   Meredeth Ide   [facility] Pager [phone]. If 7PM-7AM, please contact night-coverage at [email or web address], Office  [phone]  password [email or web address]  04/03/2017, 2:00 PM  LOS: 1 day

## 2017-04-03 NOTE — Progress Notes (Signed)
Chaplain rounded on pt today.  Pt asleep.  No caregiver in room at time.

## 2017-04-03 NOTE — Consult Note (Signed)
WOC Nurse wound consult note Reason for Consult:Bilateral buttocks, coccygeal wounds, Unstageable. Left heel stage II, right medial foot and lateral heel  DTI,  Wound type:pressure Pressure Injury POA: Yes Measurement: Coccygeal wound 5cm x 5cm x 0.3 cm with 2.5cm undermining from 1-3 o'clock, with 90% tan sough, small amt drainage, malodorous. Wounds extending from this wound onto both buttocks are each 7cm x 3.5cm x 0.1cm unstageable due to 80% yellow slough, 20% black tissue. Right medial foot has 2.5cm DTI dark purple. Right lateral heel has 1.5cm DTI, left lateral heel has 1.5cm stage II Wound bed:see above Drainage (amount, consistency, odor) see above Periwound: intact Dressing procedure/placement/frequency:  I have provided nurses with orders for Santyl ointment for enzymatic debridement daily dressing changes for sacrum and buttocks. Pt to stay off this area with frequent turning schedule. Purewick is in place to manage urinary incontinence.  Heels are in foam and has protective boots on from her facility where she resides. I will order a low air loss replacement mattress. If no source of infection found as source of sepsis the wounds are a definite possibility. Please order surgical consult if you agree. We will not follow, but will remain available to this patient, to nursing, and the medical and/or surgical teams.  Please re-consult if we need to assist further.   Barnett HatterMelinda Kimiyo Carmicheal, RN-C, WTA-C Wound Treatment Associate

## 2017-04-03 NOTE — Progress Notes (Signed)
Critical lab value: potassium = 2.5 Notified MD: [place]vo[place], G. , S.  Time: 0708

## 2017-04-04 ENCOUNTER — Other Ambulatory Visit: Payer: Self-pay

## 2017-04-04 DIAGNOSIS — Z515 Encounter for palliative care: Secondary | ICD-10-CM

## 2017-04-04 DIAGNOSIS — S31000A Unspecified open wound of lower back and pelvis without penetration into retroperitoneum, initial encounter: Secondary | ICD-10-CM

## 2017-04-04 DIAGNOSIS — Z7189 Other specified counseling: Secondary | ICD-10-CM

## 2017-04-04 LAB — BASIC METABOLIC PANEL
Anion gap: 10 (ref 5–15)
BUN: 6 mg/dL (ref 6–20)
CHLORIDE: 114 mmol/L — AB (ref 101–111)
CO2: 23 mmol/L (ref 22–32)
CREATININE: 0.73 mg/dL (ref 0.44–1.00)
Calcium: 8.1 mg/dL — ABNORMAL LOW (ref 8.9–10.3)
GFR calc non Af Amer: >60 mL/min (ref >60)
Glucose, Bld: 103 mg/dL — ABNORMAL HIGH (ref 65–99)
Potassium: 3 mmol/L — ABNORMAL LOW (ref 3.5–5.1)
Sodium: 147 mmol/L — ABNORMAL HIGH (ref 135–145)

## 2017-04-04 MED ORDER — LEVETIRACETAM 100 MG/ML PO SOLN
1250.0000 mg | Freq: Two times a day (BID) | ORAL | Status: DC
  Administered 2017-04-05 – 2017-04-09 (×9): 1250 mg via ORAL
  Filled 2017-04-04 (×13): qty 15

## 2017-04-04 MED ORDER — LAMOTRIGINE 100 MG PO TABS
100.0000 mg | ORAL_TABLET | Freq: Every day | ORAL | Status: DC
  Administered 2017-04-04 – 2017-04-09 (×6): 100 mg via ORAL
  Filled 2017-04-04 (×7): qty 1

## 2017-04-04 MED ORDER — LEVETIRACETAM 500 MG PO TABS: 1250.0000 mg | ORAL_TABLET | Freq: Two times a day (BID) | ORAL | Status: DC

## 2017-04-04 MED ORDER — POTASSIUM CHLORIDE 10 MEQ/100ML IV SOLN
10.0000 meq | INTRAVENOUS | Status: AC
  Administered 2017-04-04 (×3): 10 meq via INTRAVENOUS
  Filled 2017-04-04 (×3): qty 100

## 2017-04-04 NOTE — Progress Notes (Signed)
Patient ID: Darrol AngelCarolyn Pyka, female   DOB: 04/20/1960, 57 y.o.   MRN: [id number]          [facility]    Date of Admission:  04/01/2017     I appreciate Dr. Beatriz StallionAnwar's assistance engaging Ms. Daphine DeutscherMartin is a guardian and a discussion about goals of care.  That discussion confirmed her recent accelerated decline in overall functional status.  Final decisions about goals of care are pending.  I would not restart empiric antibiotics at this point.  Any reasonable attempt to cure sacral wound infection and heal her large sacral wound would require a very aggressive multipronged approach over many months.  Antibiotics by themselves would not be sufficient.         Cliffton AstersJohn Chelsy Parrales, MD [facility] [phone] pager   [phone] cell 04/04/2017, 4:51 PM

## 2017-04-04 NOTE — Consult Note (Signed)
Consultation Note Date: 04/04/2017   Patient Name: Brooke Palmer  DOB: 1960/10/16  MRN: [id number]  Age / Sex: 57 y.o., female  PCP: Royals, Gretta Began Referring Physician: Meredeth Ide, MD  Reason for Consultation: Establishing goals of care  HPI/Patient Profile: 57 y.o. female  admitted on 04/01/2017 from the wound care center due to a sacral decubitus ulcer that appears to be worsening. Patient was reportedly lethargic and febrile 100.4 on arrival. She has a medical history significant for mental disability, [name], Seizure disorder, hypothyroidism, and DVT. She is nonverbal. Patient at baseline does not walk- ambulates with wheelchair and assistance.  She was admitted 03/07/2016 for sepsis though to be 2/2 PNA, also consideration for sacral ulcer as etiology but per chart, at that time wound did not appear to be infected.  In ED: mild intermittent tachycardia to 102, Bp down to 104/61. WBC- 13.9, Na elevated 162, K low at 2.8, normal magnesium, Cr close to baseline 0.84.  Chest x-ray mild central vascular congestion without acute pneumonic consolidation.  She was started on D5 water, given 2 runs of KCL. She was admitted for electrolyte abnormalities possible pneumonia and sacral decubitus ulcer.   Clinical Assessment and Goals of Care: I have reviewed medical records including MAR, radiology images, and lab results, received report from bedside RN, and assessed the patient. Patient is nonverbal and is a ward of the state. Her legal documented guardian is [employer] which is a group home facility in [place]. Samuel Germany is her legal guardian contact. I spoke with Jola Babinski over the phone to discuss diagnosis prognosis, GOC, EOL wishes, disposition and options.   I introduced Palliative Medicine as specialized medical care for people living with serious illness. It focuses on providing relief from  the symptoms and stress of a serious illness. The goal is to improve quality of life for both the patient and the family.  We discussed a brief life review of the patient. She explained that she has overseen Ms. Ikeda's care for over 20 years. She was a resident at their group home facility in [place] previously. At one point she was able to function on her own with guidance however, this has not been the case for over 10 years. She does not have any known family at this time.   As far as functional and nutritional status. Jola Babinski explained that over the past 6-9 Ms. Gehling's health status has declined significantly. She was transferred to [facility] the end of October 2018 due to her decline and requiring more involved and skilled care. She has noticed however, that since she was transferred to their facility here she has declined even more and requiring more hospitalizations and care. Her nutritional intake has been poor for several months with her refusing to eat at times or only eating small amounts. She has been bed or chair ridden with max assist over the past 6 months which she feels has contributed to her pressure ulcers.   We discussed their current illness and what it means  in the larger context of their on-going co-morbidities.  Natural disease trajectory and expectations at EOL were discussed. The difference between aggressive medical intervention and comfort care was considered in light of the patient's goals of care. Jola Babinski, verbalized understanding of severity of decubitus ulcerations, risk of recurrent infections related to immobility, decrease in nutritional status, and in light of current lab findings. We discussed ID recommendations and in terms of being aggressive and proceeding with potential surgical consults, antibiotic therapy (which she is aware would continue on an outpatient basis as well), sepsis risk, and aggressive wound care post any recommended procedures.   Jola Babinski  verbalized although she is the assigned guardian she does not have the ultimate decision and would need to discuss more in detail with her director of the facility. She did state that they had began having these conversations regarding her decline and future outlook and was aware that this conversation would be of importance soon. They do not want her to "suffer" or live in pain or discomfort.   I attempted to elicit values and goals of care important to the patient and her care team.  Advanced directives, concepts specific to code status, artifical feeding and hydration, and rehospitalization were considered and discussed. At this time she will remain a full code until Jola Babinski and her director have a discussion. Jola Babinski verbalized understanding of long-term outcomes and the need to make these necessary decisions.   Hospice and Palliative Care services outpatient were explained and offered. Per Jola Babinski she reported that all information given was helpful and made the situation more clearer. Once they agree on the DNR, they would want her to move towards comfort care, and would like our facility to work to see if there was a hospice facility in [place] or [address] as this is were she resided for over 20 years and this is were all of her caretakers and friends are. Neither location of their group homes are equipped to care for patients at this state, and she stated she would not be able to return. If she is not eligible for residential hospice, they would like for CSW to attempt SNF placement also in the same regions as mentioned. Again, they are discussing today to make this decision and I am awaiting a return call by the end of the day to finalize their decisions.   Questions and concerns were addressed.  The caregiver was encouraged to call with questions or concerns.  PMT will continue to support holistically.  Primary Decision Maker: Samuel Germany (Legal Guardian)    SUMMARY OF RECOMMENDATIONS     FULL CODE (legal guardians are in discussion will await return call with their decision regarding establishment of DNR DNI)  Continue treating the treatable as per attending and ID  RN staff to assess for agitation and body language regarding pain/discomfort   Code Status/Advance Care Planning:  Full code  Symptom Management:   Patient appears in no apparent distress will continue to monitor body language and for signs of agitation and pain.  Agree with IV Ativan PRN for seizures and/or agitation.   Palliative Prophylaxis:   Bowel Regimen, Frequent Pain Assessment, Oral Care, Turn Reposition and wound care  Additional Recommendations (Limitations, Scope, Preferences):  Full Scope Treatment  Psycho-social/Spiritual:   Desire for further Chaplaincy support:no  Additional Recommendations: Education on Hospice and Palliative Care   Prognosis:   Unable to determine-guarded with anticipation of poor prognosis given risk for infection r/t decubitus ulcerations and nutritional decline  Discharge Planning: To Be Determined Residential hospice vs. SNF with hospice services      Primary Diagnoses: Present on Admission: . Sacral decubitus ulcer . Hypernatremia . DVT (deep venous thrombosis) (HCC) . Hypothyroid . Anemia   I have reviewed the medical record, interviewed the patient and family, and examined the patient. The following aspects are pertinent.  Past Medical History:  Diagnosis Date  . Arthritis    osteo  . DVT (deep venous thrombosis) (HCC)    bilateral legs  . Dysphasia   . Insomnia   . PTSD (post-traumatic stress disorder)   . Seizures (HCC)    Social History   Socioeconomic History  . Marital status: Single    Spouse name: None  . Number of children: None  . Years of education: None  . Highest education level: None  Social Needs  . Financial resource strain: None  . Food insecurity - worry: None  . Food insecurity - inability: None  .  Transportation needs - medical: None  . Transportation needs - non-medical: None  Occupational History  . None  Tobacco Use  . Smoking status: Never Smoker  . Smokeless tobacco: Never Used  Substance and Sexual Activity  . Alcohol use: No    Frequency: Never  . Drug use: No  . Sexual activity: No  Other Topics Concern  . None  Social History Narrative  . None   Family History  Family history unknown: Yes   Scheduled Meds: . collagenase   Topical Daily  . erythromycin   Right Eye Q8H  . fludrocortisone  0.1 mg Oral Daily  . lamoTRIgine  100 mg Oral Daily  . levETIRAcetam  1,250 mg Oral BID  . levothyroxine  100 mcg Oral QAC breakfast  . memantine  5 mg Oral Daily  . rivaroxaban  20 mg Oral Q supper   Continuous Infusions: . dextrose 5 % and 0.45% NaCl 75 mL/hr at 04/04/17 0936   PRN Meds:.LORazepam, ondansetron **OR** ondansetron (ZOFRAN) IV Medications Prior to Admission:  Prior to Admission medications   Medication Sig Start Date End Date Taking? Authorizing Provider  acetic acid (VOSOL) 2 % otic solution Place 4 drops into both ears 2 (two) times daily.   Yes [provider]  acetic acid-hydrocortisone (VOSOL-HC) OTIC solution Place 4 drops into both ears 3 (three) times daily as needed (ear infection prevention).    Yes [provider]  Calcium Carbonate-Vitamin D (OSCAL 500/200 D-3 PO) Take 1 tablet by mouth 2 (two) times daily.    Yes [provider]  carbamide peroxide (DEBROX) 6.5 % OTIC solution Place 5 drops into both ears 2 (two) times daily as needed (ear infection prevention).    Yes [provider]  Cholecalciferol (VITAMIN D3) 2000 units TABS Take 2,000 Units by mouth daily after breakfast.   Yes [provider]  coal tar (NEUTROGENA T-GEL) 0.5 % shampoo Apply 1 application topically at bedtime as needed (scalp, dandruff).    Yes [provider]  collagenase (SANTYL) ointment Apply 1 application topically  daily.   Yes [provider]  diazepam (DIASTAT ACUDIAL) 10 MG GEL Place 10 mg rectally daily as needed for seizure.   Yes [provider]  docusate sodium (COLACE) 100 MG capsule Take 100 mg by mouth daily.   Yes [provider]  fludrocortisone (FLORINEF) 0.1 MG tablet Take 0.1 mg by mouth daily. 12/12/16  Yes [provider]  lamoTRIgine (LAMICTAL) 100 MG tablet Take 100 mg by  mouth daily. 12/12/16  Yes [provider]  levETIRAcetam (KEPPRA) 250 MG tablet Take 1,250 mg by mouth 2 (two) times daily.  02/26/17  Yes [provider]  levothyroxine (SYNTHROID, LEVOTHROID) 100 MCG tablet Take 100 mcg by mouth daily. 12/12/16  Yes [provider]  Lidocaine-Glycerin (PREPARATION H) 5-14.4 % CREA Place 1 application rectally daily as needed (hemorrhoids).    Yes [provider]  Melatonin 3 MG TABS Take 3 mg by mouth at bedtime.    Yes [provider]  memantine (NAMENDA) 5 MG tablet Take 5 mg by mouth daily.  12/12/16  Yes [provider]  Menthol-Zinc Oxide (RISAMINE) 0.44-20.625 % OINT Apply 1 application topically 2 (two) times daily.   Yes [provider]  montelukast (SINGULAIR) 10 MG tablet Take 10 mg by mouth daily. 12/12/16  Yes [provider]  omeprazole (PRILOSEC) 20 MG capsule Take 20 mg by mouth daily. 12/12/16  Yes [provider]  phenylephrine-shark liver oil-mineral oil-petrolatum (PREPARATION H) 0.25-3-14-71.9 % rectal ointment Place 1 application rectally 2 (two) times daily as needed for hemorrhoids.   Yes [provider]  rivaroxaban (XARELTO) 20 MG TABS tablet Take 20 mg by mouth daily with supper.   Yes [provider]  doxycycline (VIBRAMYCIN) 100 MG capsule Take 1 capsule (100 mg total) by mouth 2 (two) times daily. Patient not taking: Reported on 04/01/2017 03/11/17   Leatha Gilding, MD  erythromycin ophthalmic ointment Place a 1/2 inch ribbon  of ointment into the lower eyelid QID for one week. Patient not taking: Reported on 02/18/2017 01/27/17   Alvira Monday, MD  levETIRAcetam (KEPPRA) 1000 MG tablet Take 1 tablet (1,000 mg total) by mouth 2 (two) times daily. Patient not taking: Reported on 02/18/2017 01/27/17   Alvira Monday, MD  potassium chloride SA (K-DUR,KLOR-CON) 20 MEQ tablet Take 1 tablet (20 mEq total) by mouth daily. Patient not taking: Reported on 02/18/2017 02/08/17   Meredith Leeds, MD   Allergies  Allergen Reactions  . Vancomycin Hives   Review of Systems  Respiratory: Negative.   Cardiovascular: Negative.   Musculoskeletal:       Bed bound, immobile   Skin:       Sacral decub, left and right heel ulceration  Neurological:       Downs, mental retardation, hx of seizures   Psychiatric/Behavioral:       Nonverbal     Physical Exam  Constitutional: Vital signs are normal.  Neurological: She is alert.  Unable to assess mental status, nonverbal, MR, down syndrome  Skin:  Sacral decub, left and right heel ulcerations     Vital Signs: BP 97/79 (BP Location: Right Arm)   Pulse 85   Temp 98.7 F (37.1 C) (Oral)   Resp 18   Ht 5\' 3"  (1.6 m)   Wt 95.3 kg (210 lb)   SpO2 97%   BMI 37.20 kg/m  Pain Assessment: Faces   Pain Score: 0-No pain   SpO2: SpO2: 97 % O2 Device:SpO2: 97 % O2 Flow Rate: .   IO: Intake/output summary:   Intake/Output Summary (Last 24 hours) at 04/04/2017 0951 Last data filed at 04/04/2017 4098 Gross per 24 hour  Intake 2417.08 ml  Output 1900 ml  Net 517.08 ml    LBM: Last BM Date: 04/01/17(per report) Baseline Weight: Weight: 95.3 kg (210 lb) Most recent weight: Weight: 95.3 kg (210 lb)     Palliative Assessment/Data:PPS 20%    Time In: 0900 Time  Out: 1010 Time Total: 70 min.   Greater than 50%  of this time was spent counseling and coordinating care related to the above assessment and plan.  Signed by:  Nikki Pickenpack-Cousar, AGNP-C Palliative  Medicine Team  Phone: [phone] Fax: [phone] Alma32-3513   Please contact Palliative Medicine Team phone at [phone] for questions and concerns.  For individual provider: See Amion  Addendum: Note addendums made as noted above Agree with Ms Cousar's note.  Patient seen and examined PMT to continue to follow with legal guardians/decision makers for Ms [facility] vitals imaging and chart review completed Rosalin HawkingZeba Esme Freund MD [facility] palliative medicine team [phone]

## 2017-04-04 NOTE — Progress Notes (Signed)
[facility]  PROGRESS NOTE  Brooke Palmer ZOX:[id number] DOB: 09-17-1960 DOA: 04/01/2017 PCP: Brooke Palmer   Brief HPI:     57 y.o. female with medical history significant for mental disability, Brooke Palmer, Seizure disorder, hypothyroidism, DVT,  brought to the ED from the wound care center. Employee of the group home present at bedside is not able to give a history, patient at baseline is nonverbal.  She is obtained from chart review and a few notes from wound care center and group home.  Patient's was reportedly lethargic, temperature 100.4 at wound care center, with sacral decubitus ulcer that looked worse.  Patient at baseline does not walk- ambulates with wheelchair.   She was admitted 03/07/2016 for sepsis though to be 2/2 PNA, also consideration for sacral ulcer as etiology but per chart, at that time wound did not appear to be infected.     Subjective   Patient seen and examined, continues to be lethargic.   Assessment/Plan:     1. Sepsis- secondary to sacral decubitus ulcer, patient  was started on linezolid and Zosyn per pharmacy. Pelvic x-ray shows no acute osseous abnormality. ID was consulted, antibiotics were discontinued by ID. Recommend goals of care due to worsening clinical status over past few months. Patient has had three admissions over past two months. Continue local wound care. Currently stable. 2. Altered mental status-patient has history of Down syndrome with developmental delay. Likely altered mental status worsening due to the infection as above. 3. Hypokalemia-potassium was too low at 3.0, will replace potassium and check BMP in am. 4. Hypernatremia-sodium is  147 today. IV fluids were changed to D 5/2 normal saline at 75 ML per hour. Follow BMP in am. 5. DVT-Xarelto has been restarted. 6. Seizure disorder-continue IV Keppra, Lamictal 7. Goals of care-called and discussed with patient's legal guardianMarilyn Palmer , she agrees for goals of care  meeting. Palliative care has been consulted.      DVT prophylaxis: Heparin  Code Status: Full code  Family Communication: Discussed with Legal guardian on phone  Disposition Plan: TBD   Consultants:  ID  Procedures:  None   Continuous infusions . dextrose 5 % and 0.45% NaCl 75 mL/hr at 04/04/17 0936      Antibiotics:   Anti-infectives (From admission, onward)   Start     Dose/Rate Route Frequency Ordered Stop   04/02/17 1000  piperacillin-tazobactam (ZOSYN) IVPB 3.375 g  Status:  Discontinued     3.375 g 12.5 mL/hr over 240 Minutes Intravenous Every 8 hours 04/02/17 0435 04/02/17 1609   04/02/17 0245  piperacillin-tazobactam (ZOSYN) IVPB 3.375 g     3.375 g 100 mL/hr over 30 Minutes Intravenous NOW 04/02/17 0244 04/02/17 0503   04/02/17 0230  linezolid (ZYVOX) IVPB 600 mg  Status:  Discontinued     600 mg 300 mL/hr over 60 Minutes Intravenous Every 12 hours 04/02/17 0226 04/02/17 1609       Objective   Vitals:   04/03/17 0502 04/03/17 1400 04/03/17 2033 04/04/17 0459  BP: 115/67 104/60 103/65 97/79  Pulse: (!) 105 93 95 85  Resp: (!) 24 20 20 18   Temp: 97.9 F (36.6 C) 98.1 F (36.7 C) 99.5 F (37.5 C) 98.7 F (37.1 C)  TempSrc: Oral Axillary Oral Oral  SpO2: 95% 96% 97% 97%  Weight:      Height:        Intake/Output Summary (Last 24 hours) at 04/04/2017 1144 Last data filed at 04/04/2017 0624 Gross per 24 hour  Intake 1846.25 ml  Output 1900 ml  Net -53.75 ml   Filed Weights   04/02/17 1141  Weight: 95.3 kg (210 lb)     Physical Examination:   Physical Exam: Eyes: No icterus, extraocular muscles intact  Mouth: Oral mucosa is moist, no lesions on palate,  Neck: Supple, no deformities, masses, or tenderness Lungs: Normal respiratory effort, bilateral clear to auscultation, no crackles or wheezes.  Heart: Regular rate and rhythm, S1 and S2 normal, no murmurs, rubs auscultated Abdomen: BS normoactive,soft,nondistended,non-tender to  palpation,no organomegaly Extremities: No pretibial edema, no erythema, no cyanosis, no clubbing Neuro : Alert and oriented to time, place and person, No focal deficits Skin:       Data Reviewed: I have personally reviewed following labs and imaging studies  CBG: No results for input(s): GLUCAP in the last 168 hours.  CBC: Recent Labs  Lab 04/01/17 2003  WBC 13.9*  NEUTROABS 11.2*  HGB 10.6*  HCT 34.3*  MCV 106.5*  PLT 336    Basic Metabolic Panel: Recent Labs  Lab 04/01/17 2003 04/01/17 2009 04/02/17 0356 04/03/17 0609 04/04/17 0601  NA 162*  --  158* 145 147*  K 2.8*  --  2.7* 2.5* 3.0*  CL 124*  --  121* 114* 114*  CO2 27  --  25 21* 23  GLUCOSE 116*  --  125* 105* 103*  BUN 15  --  14 8 6   CREATININE 0.84  --  0.86 0.75 0.73  CALCIUM 8.3*  --  7.9* 7.7* 8.1*  MG  --  2.4  --   --   --     Recent Results (from the past 240 hour(s))  Blood culture (routine x 2)     Status: None (Preliminary result)   Collection Time: 04/01/17 11:39 PM  Result Value Ref Range Status   Specimen Description BLOOD LEFT ARM  Final   Special Requests   Final    BOTTLES DRAWN AEROBIC AND ANAEROBIC Blood Culture adequate volume Performed at [facility], [address] [place], [place] [address]    Culture   Final    NO GROWTH 1 DAY Performed at [facility], [address] [place], [place] [address]    Report Status PENDING  Incomplete  Urine culture     Status: Abnormal   Collection Time: 04/02/17 12:58 AM  Result Value Ref Range Status   Specimen Description   Final    URINE, RANDOM Performed at [facility], [address] [place], [place] [address]    Special Requests   Final    NONE Performed at [facility], [address] [place], [place] [address]    Culture MULTIPLE SPECIES PRESENT, SUGGEST RECOLLECTION (A)  Final   Report Status 04/03/2017 FINAL  Final  Blood culture (routine x 2)      Status: None (Preliminary result)   Collection Time: 04/02/17  3:58 AM  Result Value Ref Range Status   Specimen Description   Final    BLOOD LEFT WRIST Performed at [facility], [address] [place], [place] [address]    Special Requests   Final    IN PEDIATRIC BOTTLE Blood Culture adequate volume Performed at [facility], [address] [place], [place] [address]    Culture   Final    NO GROWTH 1 DAY Performed at [facility], [address] [place], [place] [address]    Report  Status PENDING  Incomplete     Liver Function Tests: Recent Labs  Lab 04/01/17 2003  AST 17  ALT 12*  ALKPHOS 122  BILITOT 0.5  PROT 7.2  ALBUMIN 2.1*   No results for input(s): LIPASE, AMYLASE in the last 168 hours. No results for input(s): AMMONIA in the last 168 hours.  Cardiac Enzymes: No results for input(s): CKTOTAL, CKMB, CKMBINDEX, TROPONINI in the last 168 hours. BNP (last 3 results) Recent Labs    02/02/17 2131  BNP 89.7    ProBNP (last 3 results) No results for input(s): PROBNP in the last 8760 hours.    Studies: No results found.  Scheduled Meds: . collagenase   Topical Daily  . erythromycin   Right Eye Q8H  . fludrocortisone  0.1 mg Oral Daily  . lamoTRIgine  100 mg Oral Daily  . levETIRAcetam  1,250 mg Oral BID  . levothyroxine  100 mcg Oral QAC breakfast  . memantine  5 mg Oral Daily  . rivaroxaban  20 mg Oral Q supper      Time spent: 35 min   Meredeth Ide   [facility] Pager [phone]. If 7PM-7AM, please contact night-coverage at [email or web address], Office  [phone]  password [facility]  04/04/2017, 11:44 AM  LOS: 2 days

## 2017-04-05 DIAGNOSIS — S31000S Unspecified open wound of lower back and pelvis without penetration into retroperitoneum, sequela: Secondary | ICD-10-CM

## 2017-04-05 LAB — CBC
HCT: 30.4 % — ABNORMAL LOW (ref 36.0–46.0)
Hemoglobin: 9.7 g/dL — ABNORMAL LOW (ref 12.0–15.0)
MCH: 32.9 pg (ref 26.0–34.0)
MCHC: 31.9 g/dL (ref 30.0–36.0)
MCV: 103.1 fL — ABNORMAL HIGH (ref 78.0–100.0)
PLATELETS: 243 10*3/uL (ref 150–400)
RBC: 2.95 MIL/uL — AB (ref 3.87–5.11)
RDW: 16.9 % — AB (ref 11.5–15.5)
WBC: 11.6 10*3/uL — ABNORMAL HIGH (ref 4.0–10.5)

## 2017-04-05 LAB — BASIC METABOLIC PANEL
Anion gap: 10 (ref 5–15)
BUN: 6 mg/dL (ref 6–20)
CALCIUM: 7.6 mg/dL — AB (ref 8.9–10.3)
CO2: 20 mmol/L — ABNORMAL LOW (ref 22–32)
Chloride: 115 mmol/L — ABNORMAL HIGH (ref 101–111)
Creatinine, Ser: 0.59 mg/dL (ref 0.44–1.00)
GFR calc Af Amer: >60 mL/min (ref >60)
GLUCOSE: 97 mg/dL (ref 65–99)
Potassium: 2.8 mmol/L — ABNORMAL LOW (ref 3.5–5.1)
SODIUM: 145 mmol/L (ref 135–145)

## 2017-04-05 LAB — MAGNESIUM: MAGNESIUM: 1.9 mg/dL (ref 1.7–2.4)

## 2017-04-05 MED ORDER — POTASSIUM CHLORIDE 10 MEQ/100ML IV SOLN
10.0000 meq | INTRAVENOUS | Status: AC
Start: 2017-04-05 — End: 2017-04-05
  Administered 2017-04-05 (×4): 10 meq via INTRAVENOUS
  Filled 2017-04-05 (×4): qty 100

## 2017-04-05 NOTE — Care Management Important Message (Signed)
Important Message  Patient Details  Name: Brooke Palmer MRN: [id number] Date of Birth: 05/02/1960   Medicare Important Message Given:  Yes    Caren MacadamFuller, Theotis Gerdeman 04/05/2017, 12:32 PMImportant Message  Patient Details  Name: Brooke Palmer MRN: [id number] Date of Birth: 01/31/1961   Medicare Important Message Given:  Yes    Caren MacadamFuller, Veida Spira 04/05/2017, 12:32 PM

## 2017-04-05 NOTE — Progress Notes (Signed)
[facility]  PROGRESS NOTE  Brooke Palmer VQQ:[id number] DOB: [birth year] DOA: 04/01/2017 PCP: Lucretia Field   Brief HPI:     57 y.o. female with medical history significant for mental disability, Brooke Palmer, Seizure disorder, hypothyroidism, DVT,  brought to the ED from the wound care center. Employee of the group home present at bedside is not able to give a history, patient at baseline is nonverbal.  She is obtained from chart review and a few notes from wound care center and group home.  Patient's was reportedly lethargic, temperature 100.4 at wound care center, with sacral decubitus ulcer that looked worse.  Patient at baseline does not walk- ambulates with wheelchair.   She was admitted 03/07/2016 for sepsis though to be 2/2 PNA, also consideration for sacral ulcer as etiology but per chart, at that time wound did not appear to be infected.     Subjective   Patient seen and examined, more alert today.   Assessment/Plan:     1. Sepsis- secondary to sacral decubitus ulcer, patient  was started on linezolid and Zosyn per pharmacy. Pelvic x-ray shows no acute osseous abnormality. ID was consulted, antibiotics were discontinued by ID. Recommend goals of care due to worsening clinical status over past few months. Patient has had three admissions over past two months. Continue local wound care. Currently stable. 2. Altered mental status-patient has history of Down syndrome with developmental delay. Likely altered mental status worsening due to the infection as above. 3. Hypokalemia-potassium is still low at 2.8, will replace potassium and check serum magnesium. 4. Hypernatremia-sodium is  145 today. IV fluids were changed to D 5/2 normal saline at 75 ML per hour. Follow BMP in am. 5. DVT-Xarelto has been restarted. 6. Seizure disorder-continue IV Keppra, Lamictal 7. Goals of care-called and discussed with patient's legal guardianMarilyn Drennen , she agrees for goals of care meeting.  Palliative care was consulted, and at this time comfort measures initiated, if she does not have good po intake over next few days, plan for hospice.      DVT prophylaxis: Heparin  Code Status: Full code  Family Communication: Discussed with Legal guardian on phone  Disposition Plan: TBD   Consultants:  ID  Procedures:  None   Continuous infusions . dextrose 5 % and 0.45% NaCl 75 mL/hr at 04/05/17 0536      Antibiotics:   Anti-infectives (From admission, onward)   Start     Dose/Rate Route Frequency Ordered Stop   04/02/17 1000  piperacillin-tazobactam (ZOSYN) IVPB 3.375 g  Status:  Discontinued     3.375 g 12.5 mL/hr over 240 Minutes Intravenous Every 8 hours 04/02/17 0435 04/02/17 1609   04/02/17 0245  piperacillin-tazobactam (ZOSYN) IVPB 3.375 g     3.375 g 100 mL/hr over 30 Minutes Intravenous NOW 04/02/17 0244 04/02/17 0503   04/02/17 0230  linezolid (ZYVOX) IVPB 600 mg  Status:  Discontinued     600 mg 300 mL/hr over 60 Minutes Intravenous Every 12 hours 04/02/17 0226 04/02/17 1609       Objective   Vitals:   04/04/17 0459 04/04/17 1331 04/04/17 2201 04/05/17 0538  BP: 97/79 107/80 (!) 95/56 122/66  Pulse: 85 79 86 93  Resp: 18 19 20 18   Temp: 98.7 F (37.1 C) 97.7 F (36.5 C) 99.5 F (37.5 C) 98.3 F (36.8 C)  TempSrc: Oral Axillary Axillary Axillary  SpO2: 97% 97% 96% 95%  Weight:      Height:  Intake/Output Summary (Last 24 hours) at 04/05/2017 1414 Last data filed at 04/05/2017 1158 Gross per 24 hour  Intake 1021.25 ml  Output 450 ml  Net 571.25 ml   Filed Weights   04/02/17 1141  Weight: 95.3 kg (210 lb)     Physical Examination:   Physical Exam: Eyes: No icterus, extraocular muscles intact  Mouth: Oral mucosa is moist, no lesions on palate,  Neck: Supple, no deformities, masses, or tenderness Lungs: Normal respiratory effort, bilateral clear to auscultation, no crackles or wheezes.  Heart: Regular rate and rhythm, S1  and S2 normal, no murmurs, rubs auscultated Abdomen: BS normoactive,soft,nondistended,non-tender to palpation,no organomegaly Extremities: No pretibial edema, no erythema, no cyanosis, no clubbing Neuro : Alert and oriented to time, place and person, No focal deficits Skin:       Data Reviewed: I have personally reviewed following labs and imaging studies  CBG: No results for input(s): GLUCAP in the last 168 hours.  CBC: Recent Labs  Lab 04/01/17 2003 04/05/17 0602  WBC 13.9* 11.6*  NEUTROABS 11.2*  --   HGB 10.6* 9.7*  HCT 34.3* 30.4*  MCV 106.5* 103.1*  PLT 336 243    Basic Metabolic Panel: Recent Labs  Lab 04/01/17 2003 04/01/17 2009 04/02/17 0356 04/03/17 0609 04/04/17 0601 04/05/17 0602  NA 162*  --  158* 145 147* 145  K 2.8*  --  2.7* 2.5* 3.0* 2.8*  CL 124*  --  121* 114* 114* 115*  CO2 27  --  25 21* 23 20*  GLUCOSE 116*  --  125* 105* 103* 97  BUN 15  --  14 8 6 6   CREATININE 0.84  --  0.86 0.75 0.73 0.59  CALCIUM 8.3*  --  7.9* 7.7* 8.1* 7.6*  MG  --  2.4  --   --   --   --     Recent Results (from the past 240 hour(s))  Blood culture (routine x 2)     Status: None (Preliminary result)   Collection Time: 04/01/17 11:39 PM  Result Value Ref Range Status   Specimen Description BLOOD LEFT ARM  Final   Special Requests   Final    BOTTLES DRAWN AEROBIC AND ANAEROBIC Blood Culture adequate volume Performed at [facility], [address] [place], [place] [address]    Culture   Final    NO GROWTH 2 DAYS Performed at [facility], [address], [place], [place] [address]    Report Status PENDING  Incomplete  Urine culture     Status: Abnormal   Collection Time: 04/02/17 12:58 AM  Result Value Ref Range Status   Specimen Description   Final    URINE, RANDOM Performed at [facility], [address], [place], [place] [address]    Special Requests   Final    NONE Performed at [facility], [address] [place], [place] [address]    Culture MULTIPLE SPECIES PRESENT, SUGGEST RECOLLECTION (A)  Final   Report Status 04/03/2017 FINAL  Final  Blood culture (routine x 2)     Status: None (Preliminary result)   Collection Time: 04/02/17  3:58 AM  Result Value Ref Range Status   Specimen Description   Final    BLOOD LEFT WRIST Performed at [facility], [address], [place], [place] [address]    Special Requests   Final    IN PEDIATRIC BOTTLE Blood Culture adequate volume Performed at  [facility], [address] [place], [place] [address]    Culture   Final    NO GROWTH 2 DAYS Performed at [facility], [address] [place], [place] [address]    Report Status PENDING  Incomplete     Liver Function Tests: Recent Labs  Lab 04/01/17 2003  AST 17  ALT 12*  ALKPHOS 122  BILITOT 0.5  PROT 7.2  ALBUMIN 2.1*   No results for input(s): LIPASE, AMYLASE in the last 168 hours. No results for input(s): AMMONIA in the last 168 hours.  Cardiac Enzymes: No results for input(s): CKTOTAL, CKMB, CKMBINDEX, TROPONINI in the last 168 hours. BNP (last 3 results) Recent Labs    02/02/17 2131  BNP 89.7    ProBNP (last 3 results) No results for input(s): PROBNP in the last 8760 hours.    Studies: No results found.  Scheduled Meds: . collagenase   Topical Daily  . erythromycin   Right Eye Q8H  . fludrocortisone  0.1 mg Oral Daily  . lamoTRIgine  100 mg Oral Daily  . levETIRAcetam  1,250 mg Oral BID  . levothyroxine  100 mcg Oral QAC breakfast  . memantine  5 mg Oral Daily  . rivaroxaban  20 mg Oral Q supper      Time spent: 35 min   Meredeth IdeGagan S Zaul Hubers   [facility] Pager [phone]. If 7PM-7AM, please contact night-coverage at [email or web address], Office  [phone]  password [email or web address]  04/05/2017, 2:14 PM  LOS: 3 days

## 2017-04-05 NOTE — Progress Notes (Signed)
Nutrition Brief Note  PD planned to assess pt for Low Braden.  Chart reviewed.  Per MD note, pt now transitioning to comfort care. Noted plan for hospice if PO intake does not improve.  No further nutrition interventions warranted at this time.  Please re-consult as needed.   Earma ReadingKate Jablonski Demarie Uhlig, MS, RD, LDN Pager: [phone] Weekend/After Hours: [phone]

## 2017-04-05 NOTE — Clinical Social Work Note (Signed)
Clinical Social Work Assessment  Patient Details  Name: Brooke Palmer MRN: [id number] Date of Birth: 09/17/1960  Date of referral:  04/05/17               Reason for consult:  Facility Placement(Patient from [facility])                Permission sought to share information with:  Case Production designer, theatre/television/filmManager, Magazine features editoracility Contact Representative, Family Supports Permission granted to share information::  No(Patient has legal gaurdian)  Name::     Dispensing opticianMarilyn  Agency::  [facility]  Relationship::  Visual merchandiserLegal Gaurdian  Contact Information:     Housing/Transportation Living arrangements for the past 2 months:  Group Home Source of Information:  Facility Patient Interpreter Needed:  None Criminal Activity/Legal Involvement Pertinent to Current Situation/Hospitalization:  No - Comment as needed Significant Relationships:  Merchandiser, retailCommunity Support Lives with:  Facility Resident Do you feel safe going back to the place where you live?  Yes Need for family participation in patient care:  Yes (Comment)  Care giving concerns:  No care giving concerns at the time of assessment.   Social Worker assessment / plan:  LCSW following for disposition.   Patient admitted for Sacral decubitus ulcer.  Patient was not able to participate in assessment. Patient has been admitted to the hospital 3x in the last 60 days. LCSW first assessed patient on 12/27 and again on 1/25. No significant changes since last hospital stay.   Patient is from [facility]. At baseline patient is non verbal and wheel chair bound. Patient is dependent Patient has a sacral wound that is not healing. Palliative is involved and patient's gaurdian has agreed to change patient to DNR.   PLAN: To be determined.   Employment status:  Disabled (Comment on whether or not currently receiving Disability) Insurance information:  Medicare PT Recommendations:  Not assessed at this time Information / Referral to community resources:     Patient/Family's  Response to care:  Unable to access.   Patient/Family's Understanding of and Emotional Response to Diagnosis, Current Treatment, and Prognosis:  Unable to access.   Emotional Assessment Appearance:  Appears stated age Attitude/Demeanor/Rapport:  Unable to Assess Affect (typically observed):  Unable to Assess Orientation:  Oriented to Self(Patient non-verbal) Alcohol / Substance use:    Psych involvement (Current and /or in the community):  No (Comment)  Discharge Needs  Concerns to be addressed:  No discharge needs identified Readmission within the last 30 days:  Yes Current discharge risk:  None Barriers to Discharge:  Continued Medical Work up   BJ'sBernette Hoyt Leanos, LCSW 04/05/2017, 11:57 AM

## 2017-04-05 NOTE — Progress Notes (Signed)
Patient ID: Brooke AngelCarolyn Palmer, female   DOB: 01/29/1961, 57 y.o.   MRN: [id number]          [facility]    Date of Admission:  04/01/2017     She is resting quietly in bed.  She remains afebrile and admission blood cultures are negative.  We are awaiting final decision from her guardian about goals of care.  I will hold off on restarting empiric antibiotics.  Please call Dr. Judyann Munsonynthia Snider [phone]) for any infectious disease questions this weekend.         Cliffton AstersJohn Lemar Bakos, MD [facility] [phone] pager   [phone] cell 04/05/2017, 11:06 AM

## 2017-04-05 NOTE — Progress Notes (Signed)
Daily Progress Note   Patient Name: Brooke Palmer       Date: 04/05/2017 DOB: 1960/03/17  Age: 57 y.o. MRN#: [id number] Attending Physician: Brooke Ide, MD Primary Care Physician: Brooke Palmer Admit Date: 04/01/2017  Reason for Consultation/Follow-up: Establishing goals of care  Subjective:  Patient is some what more awake/alert Bedside RN states that the patient has had a few more bites/sips to eat this am.  She does appear to be in some distress, probably IV K supplementation causing her discomfort- bedside RN addressing.   Call placed and discussed with legal guardian Brooke Palmer at [phone], see below:   Length of Stay: 3  Current Medications: Scheduled Meds:  . collagenase   Topical Daily  . erythromycin   Right Eye Q8H  . fludrocortisone  0.1 mg Oral Daily  . lamoTRIgine  100 mg Oral Daily  . levETIRAcetam  1,250 mg Oral BID  . levothyroxine  100 mcg Oral QAC breakfast  . memantine  5 mg Oral Daily  . rivaroxaban  20 mg Oral Q supper    Continuous Infusions: . dextrose 5 % and 0.45% NaCl 75 mL/hr at 04/05/17 0536  . potassium chloride 10 mEq (04/05/17 1012)    PRN Meds: LORazepam, ondansetron **OR** ondansetron (ZOFRAN) IV  Physical Exam         Awake, some what more alert In mild distress Blinks her eyes, raises her eye brows, other wise, is non communicative.  Regular S1 S2 Abdomen soft Wound pictures noted in Epic chart.   Vital Signs: BP 122/66 (BP Location: Right Arm)   Pulse 93   Temp 98.3 F (36.8 C) (Axillary)   Resp 18   Ht 5\' 3"  (1.6 m)   Wt 95.3 kg (210 lb)   SpO2 95%   BMI 37.20 kg/m  SpO2: SpO2: 95 % O2 Device: O2 Device: Not Delivered O2 Flow Rate:    Intake/output summary:   Intake/Output Summary (Last 24 hours) at 04/05/2017  1112 Last data filed at 04/05/2017 0538 Gross per 24 hour  Intake 1011.25 ml  Output 850 ml  Net 161.25 ml   LBM: Last BM Date: 04/01/17 Baseline Weight: Weight: 95.3 kg (210 lb) Most recent weight: Weight: 95.3 kg (210 lb)       Palliative Assessment/Data: PPS 20-30%  Patient Active Problem List   Diagnosis Date Noted  . Sacral wound   . Encounter for palliative care   . Goals of care, counseling/discussion   . Sacral decubitus ulcer 04/02/2017  . Mental disability 04/02/2017  . Hypernatremia 03/07/2017  . Anemia 03/07/2017  . Seizure disorder (HCC) 02/02/2017  . Hypothyroid 02/02/2017  . GERD (gastroesophageal reflux disease) 02/02/2017  . DVT (deep venous thrombosis) (HCC) 02/02/2017    Palliative Care Assessment & Plan   Patient Profile:    Assessment:  Sacral Decubitus ulcer Down's syndrome Hypokalemia History of Seizure disorder.  History of DVT  Recommendations/Plan:   Call placed and discussed in detail with guardian Brooke BabinskiMarilyn at [phone].   Ms Brooke BabinskiMarilyn states that the patient has had gradual progressive decline recently, she would not be able to understand or cooperate with aggressive procedures such as debridements, PICC lines, extended courses of Antibiotics, further imaging such as MRI.   We then discussed about code status. Discussed frankly that the full course of a resuscitative attempt would be more harmful than helpful. CPR could cause more harm than benefit in Ms Driggers's case. At end of life, if efforts were made for the patient to be kept comfortable, that would be best. Patient might have continued decline, might develop bacteremia/severe sepsis. She might have ongoing nutritional and functional decline. In this situation, a mode of care that would focus on DNR DNI is deemed most prudent and most medically appropriate at this time. Hence, after discussions, we are establishing DNR DNI.   Further goals of care to be out lined depending on the  patient's hospital course. If electrolyte abnormalities are corrected, gentle wound care measures are continued, if patient doesn't have extensive symptom burden, if patient is able to have some reasonable PO intake, then SNF rehab with palliative measures and then SNF with hospice would be appropriate.   If the patient is not eating enough to sustain herself, if she has worsening symptom burden necessitating escalation of pain regimen, then one could consider residential hospice after discharge.   There simply would have to be a period of observation as a time limited trial for the next few days or so before a safe disposition plan could be out lined. PMT will continue to follow.     Code Status:    Code Status Orders  (From admission, onward)        Start     Ordered   04/02/17 0608  Full code  Continuous     04/02/17 0607    Code Status History    Date Active Date Inactive Code Status Order ID Comments User Context   03/07/2017 18:46 03/11/2017 17:12 Full Code [id number]  Pearson GrippeKim, James, MD ED   02/03/2017 02:31 02/07/2017 19:12 Full Code [id number]  Rometta EmeryGarba, Mohammad L, MD Inpatient       Prognosis:   guarded. Patient could be hospice eligible, in my opinion. Discussed with legal guardian.   Discharge Planning:  To Be Determined  Care plan was discussed with patient's guardian Brooke BabinskiMarilyn, bedside RN, [facility].   Thank you for allowing the Palliative Medicine Team to assist in the care of this patient.   Time In: 10.30 Time Out: 11.05 Total Time 35  Prolonged Time Billed  no       Greater than 50%  of this time was spent counseling and coordinating care related to the above assessment and plan.  Rosalin HawkingZeba Yanina Knupp, MD [phone]  Please contact Palliative Medicine Team phone at [phone] for questions and concerns.

## 2017-04-06 LAB — BASIC METABOLIC PANEL
ANION GAP: 12 (ref 5–15)
BUN: 6 mg/dL (ref 6–20)
CALCIUM: 7.6 mg/dL — AB (ref 8.9–10.3)
CO2: 19 mmol/L — ABNORMAL LOW (ref 22–32)
Chloride: 113 mmol/L — ABNORMAL HIGH (ref 101–111)
Creatinine, Ser: 0.64 mg/dL (ref 0.44–1.00)
Glucose, Bld: 86 mg/dL (ref 65–99)
Potassium: 3.2 mmol/L — ABNORMAL LOW (ref 3.5–5.1)
Sodium: 144 mmol/L (ref 135–145)

## 2017-04-06 MED ORDER — POTASSIUM CHLORIDE 10 MEQ/100ML IV SOLN
10.0000 meq | INTRAVENOUS | Status: AC
  Administered 2017-04-06 (×3): 10 meq via INTRAVENOUS
  Filled 2017-04-06 (×4): qty 100

## 2017-04-06 MED ORDER — MORPHINE SULFATE (PF) 4 MG/ML IV SOLN
1.0000 mg | INTRAVENOUS | Status: DC | PRN
  Administered 2017-04-06 – 2017-04-09 (×7): 2 mg via INTRAVENOUS
  Filled 2017-04-06 (×7): qty 1

## 2017-04-06 NOTE — Progress Notes (Signed)
[facility]  PROGRESS NOTE  Verlee Pope RUE:[id number] DOB: Jun 06, 1960 DOA: 04/01/2017 PCP: Lucretia Field   Brief HPI:     57 y.o. female with medical history significant for mental disability, [name], Seizure disorder, hypothyroidism, DVT,  brought to the ED from the wound care center. Employee of the group home present at bedside is not able to give a history, patient at baseline is nonverbal.  She is obtained from chart review and a few notes from wound care center and group home.  Patient's was reportedly lethargic, temperature 100.4 at wound care center, with sacral decubitus ulcer that looked worse.  Patient at baseline does not walk- ambulates with wheelchair.   She was admitted 03/07/2016 for sepsis though to be 2/2 PNA, also consideration for sacral ulcer as etiology but per chart, at that time wound did not appear to be infected.     Subjective   Patient seen and examined, alert, eating breakfast.   Assessment/Plan:     1. Sepsis- secondary to sacral decubitus ulcer, patient  was started on linezolid and Zosyn per pharmacy. Pelvic x-ray shows no acute osseous abnormality. ID was consulted, antibiotics were discontinued by ID. Recommend goals of care due to worsening clinical status over past few months. Patient has had three admissions over past two months. Continue local wound care. 2. Altered mental status-patient has history of Down syndrome with developmental delay. Likely altered mental status worsening due to the infection as above. 3. Hypokalemia-potassium is 3.2 today, serum magnesium is 1.9., will replace potassium and check BMP in am 4. Hypernatremia-sodium is 144 today. IV fluids were changed to D 5/2 normal saline at 75 ML per hour. Follow BMP in am. 5. DVT-Xarelto has been restarted. 6. Seizure disorder-continue IV Keppra, Lamictal 7. Goals of care-called and discussed with patient's legal guardianMarilyn Drennen , she agrees for goals of care meeting.  Palliative care was consulted, and at this time comfort measures initiated, if she does not have good po intake over next few days, plan for hospice.      DVT prophylaxis: Heparin  Code Status: Full code  Family Communication: Discussed with Legal guardian on phone  Disposition Plan: TBD   Consultants:  ID  Procedures:  None   Continuous infusions . dextrose 5 % and 0.45% NaCl 75 mL/hr at 04/06/17 0958      Antibiotics:   Anti-infectives (From admission, onward)   Start     Dose/Rate Route Frequency Ordered Stop   04/02/17 1000  piperacillin-tazobactam (ZOSYN) IVPB 3.375 g  Status:  Discontinued     3.375 g 12.5 mL/hr over 240 Minutes Intravenous Every 8 hours 04/02/17 0435 04/02/17 1609   04/02/17 0245  piperacillin-tazobactam (ZOSYN) IVPB 3.375 g     3.375 g 100 mL/hr over 30 Minutes Intravenous NOW 04/02/17 0244 04/02/17 0503   04/02/17 0230  linezolid (ZYVOX) IVPB 600 mg  Status:  Discontinued     600 mg 300 mL/hr over 60 Minutes Intravenous Every 12 hours 04/02/17 0226 04/02/17 1609       Objective   Vitals:   04/05/17 1429 04/05/17 2048 04/06/17 0524 04/06/17 0757  BP: (!) 99/54 (!) 90/51 (!) 108/50 (!) 100/51  Pulse: 95 64 85 80  Resp: 18 17 18 17   Temp: 100.3 F (37.9 C) 98.7 F (37.1 C) 98.1 F (36.7 C) 98.3 F (36.8 C)  TempSrc: Axillary Axillary Axillary Axillary  SpO2: 99% 91% 97% 99%  Weight:      Height:  Intake/Output Summary (Last 24 hours) at 04/06/2017 1104 Last data filed at 04/06/2017 0950 Gross per 24 hour  Intake 3500 ml  Output 700 ml  Net 2800 ml   Filed Weights   04/02/17 1141  Weight: 95.3 kg (210 lb)     Physical Examination:   Physical Exam: Eyes: No icterus, extraocular muscles intact  Mouth: Oral mucosa is moist, no lesions on palate,  Neck: Supple, no deformities, masses, or tenderness Lungs: Normal respiratory effort, bilateral clear to auscultation, no crackles or wheezes.  Heart: Regular rate  and rhythm, S1 and S2 normal, no murmurs, rubs auscultated Abdomen: BS normoactive,soft,nondistended,non-tender to palpation,no organomegaly Extremities: No pretibial edema, no erythema, no cyanosis, no clubbing Neuro : Alert , not oriented to time place and person Skin:       Data Reviewed: I have personally reviewed following labs and imaging studies  CBG: No results for input(s): GLUCAP in the last 168 hours.  CBC: Recent Labs  Lab 04/01/17 2003 04/05/17 0602  WBC 13.9* 11.6*  NEUTROABS 11.2*  --   HGB 10.6* 9.7*  HCT 34.3* 30.4*  MCV 106.5* 103.1*  PLT 336 243    Basic Metabolic Panel: Recent Labs  Lab 04/01/17 2009 04/02/17 0356 04/03/17 0609 04/04/17 0601 04/05/17 0602 04/05/17 1432 04/06/17 0630  NA  --  158* 145 147* 145  --  144  K  --  2.7* 2.5* 3.0* 2.8*  --  3.2*  CL  --  121* 114* 114* 115*  --  113*  CO2  --  25 21* 23 20*  --  19*  GLUCOSE  --  125* 105* 103* 97  --  86  BUN  --  14 8 6 6   --  6  CREATININE  --  0.86 0.75 0.73 0.59  --  0.64  CALCIUM  --  7.9* 7.7* 8.1* 7.6*  --  7.6*  MG 2.4  --   --   --   --  1.9  --     Recent Results (from the past 240 hour(s))  Blood culture (routine x 2)     Status: None (Preliminary result)   Collection Time: 04/01/17 11:39 PM  Result Value Ref Range Status   Specimen Description BLOOD LEFT ARM  Final   Special Requests   Final    BOTTLES DRAWN AEROBIC AND ANAEROBIC Blood Culture adequate volume Performed at [facility], [address] [place], [place] [address]    Culture   Final    NO GROWTH 3 DAYS Performed at [facility], [address] [place], [place] [address]    Report Status PENDING  Incomplete  Urine culture     Status: Abnormal   Collection Time: 04/02/17 12:58 AM  Result Value Ref Range Status   Specimen Description   Final    URINE, RANDOM Performed at [facility], [address] [place], [place] [address]    Special Requests    Final    NONE Performed at [facility], [address] [place], [place] [address]    Culture MULTIPLE SPECIES PRESENT, SUGGEST RECOLLECTION (A)  Final   Report Status 04/03/2017 FINAL  Final  Blood culture (routine x 2)     Status: None (Preliminary result)   Collection Time: 04/02/17  3:58 AM  Result Value Ref Range Status   Specimen Description   Final    BLOOD LEFT WRIST Performed at [facility], [address], [place], [place] [address]    Special Requests   Final    IN PEDIATRIC BOTTLE Blood Culture adequate volume Performed at [facility], [address] [place], [place] [address]    Culture   Final    NO GROWTH 3 DAYS Performed at [facility], [address] [place], [place] [address]    Report Status PENDING  Incomplete     Liver Function Tests: Recent Labs  Lab 04/01/17 2003  AST 17  ALT 12*  ALKPHOS 122  BILITOT 0.5  PROT 7.2  ALBUMIN 2.1*   No results for input(s): LIPASE, AMYLASE in the last 168 hours. No results for input(s): AMMONIA in the last 168 hours.  Cardiac Enzymes: No results for input(s): CKTOTAL, CKMB, CKMBINDEX, TROPONINI in the last 168 hours. BNP (last 3 results) Recent Labs    02/02/17 2131  BNP 89.7    ProBNP (last 3 results) No results for input(s): PROBNP in the last 8760 hours.    Studies: No results found.  Scheduled Meds: . collagenase   Topical Daily  . erythromycin   Right Eye Q8H  . fludrocortisone  0.1 mg Oral Daily  . lamoTRIgine  100 mg Oral Daily  . levETIRAcetam  1,250 mg Oral BID  . levothyroxine  100 mcg Oral QAC breakfast  . memantine  5 mg Oral Daily  . rivaroxaban  20 mg Oral Q supper      Time spent: 35 min   Meredeth Ide   [facility] Pager [phone]. If 7PM-7AM, please contact night-coverage at [email or web address], Office  [phone]  password [email or web address]  04/06/2017, 11:04 AM  LOS: 4 days

## 2017-04-07 LAB — BASIC METABOLIC PANEL
Anion gap: 10 (ref 5–15)
BUN: 6 mg/dL (ref 6–20)
CALCIUM: 7.5 mg/dL — AB (ref 8.9–10.3)
CO2: 21 mmol/L — AB (ref 22–32)
CREATININE: 0.59 mg/dL (ref 0.44–1.00)
Chloride: 114 mmol/L — ABNORMAL HIGH (ref 101–111)
GFR calc Af Amer: >60 mL/min (ref >60)
GLUCOSE: 103 mg/dL — AB (ref 65–99)
Potassium: 2.7 mmol/L — CL (ref 3.5–5.1)
Sodium: 145 mmol/L (ref 135–145)

## 2017-04-07 LAB — CULTURE, BLOOD (ROUTINE X 2)
CULTURE: NO GROWTH
Culture: NO GROWTH
Special Requests: ADEQUATE
Special Requests: ADEQUATE

## 2017-04-07 MED ORDER — POTASSIUM CHLORIDE 10 MEQ/100ML IV SOLN
10.0000 meq | INTRAVENOUS | Status: AC
  Administered 2017-04-07 (×5): 10 meq via INTRAVENOUS
  Filled 2017-04-07 (×6): qty 100

## 2017-04-07 NOTE — Progress Notes (Signed)
MD present and aware of critical lab value of K+ 2.7 Orders to be written.

## 2017-04-07 NOTE — Progress Notes (Signed)
[facility]  PROGRESS NOTE  Brooke Palmer ZOX:[id number] DOB: 01/21/1961 DOA: 04/01/2017 PCP: Lucretia Field   Brief HPI:     57 y.o. female with medical history significant for mental disability, [name], Seizure disorder, hypothyroidism, DVT,  brought to the ED from the wound care center. Employee of the group home present at bedside is not able to give a history, patient at baseline is nonverbal.  She is obtained from chart review and a few notes from wound care center and group home.  Patient's was reportedly lethargic, temperature 100.4 at wound care center, with sacral decubitus ulcer that looked worse.  Patient at baseline does not walk- ambulates with wheelchair.   She was admitted 03/07/2016 for sepsis though to be 2/2 PNA, also consideration for sacral ulcer as etiology but per chart, at that time wound did not appear to be infected.     Subjective   Patient seen and examined somnolent this morning, patient has been eating well as per nursing staff.   Assessment/Plan:     1. Sepsis- secondary to sacral decubitus ulcer, patient  was started on linezolid and Zosyn per pharmacy. Pelvic x-ray shows no acute osseous abnormality. ID was consulted, antibiotics were discontinued by ID. Recommend goals of care due to worsening clinical status over past few months. Patient has had three admissions over past two months. Continue local wound care. 2. Altered mental status-patient has history of Down syndrome with developmental delay. Likely altered mental status worsening due to the infection as above. 3. Hypokalemia-potassium is 2.7 today, serum magnesium is 1.9., will replace potassium and check BMP in am 4. Hypernatremia-sodium is 145 today. IV fluids were changed to D 5/2 normal saline at 75 ML per hour. Follow BMP in am. 5. DVT-Xarelto has been restarted. 6. Seizure disorder-continue IV Keppra, Lamictal 7. Goals of care-called and discussed with patient's legal guardianMarilyn  Palmer , she agrees for goals of care meeting. Palliative care was consulted, and at this time comfort measures initiated, if she does not have good po intake over next few days, plan for hospice.      DVT prophylaxis: Heparin  Code Status: Full code  Family Communication: Discussed with Legal guardian on phone  Disposition Plan: TBD   Consultants:  ID  Procedures:  None   Continuous infusions . dextrose 5 % and 0.45% NaCl 75 mL/hr at 04/07/17 1102  . potassium chloride        Antibiotics:   Anti-infectives (From admission, onward)   Start     Dose/Rate Route Frequency Ordered Stop   04/02/17 1000  piperacillin-tazobactam (ZOSYN) IVPB 3.375 g  Status:  Discontinued     3.375 g 12.5 mL/hr over 240 Minutes Intravenous Every 8 hours 04/02/17 0435 04/02/17 1609   04/02/17 0245  piperacillin-tazobactam (ZOSYN) IVPB 3.375 g     3.375 g 100 mL/hr over 30 Minutes Intravenous NOW 04/02/17 0244 04/02/17 0503   04/02/17 0230  linezolid (ZYVOX) IVPB 600 mg  Status:  Discontinued     600 mg 300 mL/hr over 60 Minutes Intravenous Every 12 hours 04/02/17 0226 04/02/17 1609       Objective   Vitals:   04/06/17 2123 04/07/17 0507 04/07/17 0800 04/07/17 0919  BP: (!) 110/52 (!) 90/52 103/61   Pulse: 99 91 94   Resp: 18 18  16   Temp: 98.4 F (36.9 C) 97.9 F (36.6 C)  99.3 F (37.4 C)  TempSrc: Axillary Axillary  Axillary  SpO2: 94% 98% 97%   Weight:  Height:        Intake/Output Summary (Last 24 hours) at 04/07/2017 1335 Last data filed at 04/07/2017 1102 Gross per 24 hour  Intake 1050 ml  Output -  Net 1050 ml   Filed Weights   04/02/17 1141  Weight: 95.3 kg (210 lb)     Physical Examination:  Physical Exam: Eyes: No icterus, extraocular muscles intact  Mouth: Oral mucosa is moist, no lesions on palate,  Neck: Supple, no deformities, masses, or tenderness Lungs: Normal respiratory effort, bilateral clear to auscultation, no crackles or wheezes.    Heart: Regular rate and rhythm, S1 and S2 normal, no murmurs, rubs auscultated Abdomen: BS normoactive,soft,nondistended,non-tender to palpation,no organomegaly Extremities: No pretibial edema, no erythema, no cyanosis, no clubbing Neuro : Alert and oriented to time, place and person, No focal deficits Skin:        Data Reviewed: I have personally reviewed following labs and imaging studies  CBG: No results for input(s): GLUCAP in the last 168 hours.  CBC: Recent Labs  Lab 04/01/17 2003 04/05/17 0602  WBC 13.9* 11.6*  NEUTROABS 11.2*  --   HGB 10.6* 9.7*  HCT 34.3* 30.4*  MCV 106.5* 103.1*  PLT 336 243    Basic Metabolic Panel: Recent Labs  Lab 04/01/17 2009  04/03/17 0609 04/04/17 0601 04/05/17 0602 04/05/17 1432 04/06/17 0630 04/07/17 0628  NA  --    < > 145 147* 145  --  144 145  K  --    < > 2.5* 3.0* 2.8*  --  3.2* 2.7*  CL  --    < > 114* 114* 115*  --  113* 114*  CO2  --    < > 21* 23 20*  --  19* 21*  GLUCOSE  --    < > 105* 103* 97  --  86 103*  BUN  --    < > 8 6 6   --  6 6  CREATININE  --    < > 0.75 0.73 0.59  --  0.64 0.59  CALCIUM  --    < > 7.7* 8.1* 7.6*  --  7.6* 7.5*  MG 2.4  --   --   --   --  1.9  --   --    < > = values in this interval not displayed.    Recent Results (from the past 240 hour(s))  Blood culture (routine x 2)     Status: None (Preliminary result)   Collection Time: 04/01/17 11:39 PM  Result Value Ref Range Status   Specimen Description BLOOD LEFT ARM  Final   Special Requests   Final    BOTTLES DRAWN AEROBIC AND ANAEROBIC Blood Culture adequate volume Performed at [facility], [address] [place], [place] [address]    Culture   Final    NO GROWTH 4 DAYS Performed at [facility], [address] [place], [place] [address]    Report Status PENDING  Incomplete  Urine culture     Status: Abnormal   Collection Time: 04/02/17 12:58 AM  Result Value Ref Range Status   Specimen  Description   Final    URINE, RANDOM Performed at [facility], [address] [place], [place] [address]    Special Requests   Final    NONE Performed at [facility], [address] [place], [place] [address]    Culture MULTIPLE SPECIES PRESENT, SUGGEST RECOLLECTION (A)  Final  Report Status 04/03/2017 FINAL  Final  Blood culture (routine x 2)     Status: None (Preliminary result)   Collection Time: 04/02/17  3:58 AM  Result Value Ref Range Status   Specimen Description   Final    BLOOD LEFT WRIST Performed at [facility], [address] [place], [place] [address]    Special Requests   Final    IN PEDIATRIC BOTTLE Blood Culture adequate volume Performed at [facility], [address], [place], [place] [address]    Culture   Final    NO GROWTH 4 DAYS Performed at [facility], [address] [place], [place] [address]    Report Status PENDING  Incomplete     Liver Function Tests: Recent Labs  Lab 04/01/17 2003  AST 17  ALT 12*  ALKPHOS 122  BILITOT 0.5  PROT 7.2  ALBUMIN 2.1*   No results for input(s): LIPASE, AMYLASE in the last 168 hours. No results for input(s): AMMONIA in the last 168 hours.  Cardiac Enzymes: No results for input(s): CKTOTAL, CKMB, CKMBINDEX, TROPONINI in the last 168 hours. BNP (last 3 results) Recent Labs    02/02/17 2131  BNP 89.7    ProBNP (last 3 results) No results for input(s): PROBNP in the last 8760 hours.    Studies: No results found.  Scheduled Meds: . collagenase   Topical Daily  . erythromycin   Right Eye Q8H  . fludrocortisone  0.1 mg Oral Daily  . lamoTRIgine  100 mg Oral Daily  . levETIRAcetam  1,250 mg Oral BID  . levothyroxine  100 mcg Oral QAC breakfast  . memantine  5 mg Oral Daily  . rivaroxaban  20 mg Oral Q supper      Time spent: 35 min   Meredeth Ide   [facility] Pager [phone]. If 7PM-7AM, please  contact night-coverage at [email or web address], Office  [phone]  password [email or web address]  04/07/2017, 1:35 PM  LOS: 5 days

## 2017-04-08 LAB — BASIC METABOLIC PANEL
ANION GAP: 8 (ref 5–15)
BUN: 5 mg/dL — ABNORMAL LOW (ref 6–20)
CALCIUM: 7.7 mg/dL — AB (ref 8.9–10.3)
CO2: 22 mmol/L (ref 22–32)
Chloride: 115 mmol/L — ABNORMAL HIGH (ref 101–111)
Creatinine, Ser: 0.59 mg/dL (ref 0.44–1.00)
Glucose, Bld: 95 mg/dL (ref 65–99)
Potassium: 3.2 mmol/L — ABNORMAL LOW (ref 3.5–5.1)
SODIUM: 145 mmol/L (ref 135–145)

## 2017-04-08 MED ORDER — POTASSIUM CHLORIDE 10 MEQ/100ML IV SOLN
10.0000 meq | INTRAVENOUS | Status: AC
  Administered 2017-04-08 (×3): 10 meq via INTRAVENOUS
  Filled 2017-04-08 (×3): qty 100

## 2017-04-08 NOTE — Progress Notes (Signed)
[facility]  PROGRESS NOTE  Brooke Palmer JWJ:[id number] DOB: [birth year] DOA: 04/01/2017 PCP: Lucretia Field   Brief HPI:     57 y.o. female with medical history significant for mental disability, Brooke Palmer, Seizure disorder, hypothyroidism, DVT,  brought to the ED from the wound care center. Employee of the group home present at bedside is not able to give a history, patient at baseline is nonverbal.  She is obtained from chart review and a few notes from wound care center and group home.  Patient's was reportedly lethargic, temperature 100.4 at wound care center, with sacral decubitus ulcer that looked worse.  Patient at baseline does not walk- ambulates with wheelchair.   She was admitted 03/07/2016 for sepsis though to be 2/2 PNA, also consideration for sacral ulcer as etiology but per chart, at that time wound did not appear to be infected.     Subjective   Patient seen and examined,  She has fair  p.o. Intake.   Assessment/Plan:     1. Sepsis-resolved, secondary to sacral decubitus ulcer, patient  was started on linezolid and Zosyn per pharmacy. Pelvic x-ray shows no acute osseous abnormality. ID was consulted, antibiotics were discontinued by ID. Recommend goals of care due to worsening clinical status over past few months. Patient has had three admissions over past two months. Continue local wound care. 2. Altered mental status-patient has history of Down syndrome with developmental delay. Likely altered mental status worsening due to the infection as above. 3. Hypokalemia-potassium is 3.2 today, serum magnesium is 1.9., will replace potassium and check BMP in am 4. Hypernatremia-sodium is 145 today. IV fluids were changed to D 5/2 normal saline at 75 ML per hour. Follow BMP in am. 5. DVT-Xarelto has been restarted. 6. Seizure disorder-continue IV Keppra, Lamictal 7. Goals of care-called and discussed with patient's legal guardianMarilyn Palmer , she agreed for goals of  care meeting. Palliative care was consulted, and at this time comfort measures initiated, patient has been eating fairly well, will plan for skilled nursing facility with hospice services      DVT prophylaxis: Heparin  Code Status: Full code  Family Communication: Discussed with Legal guardian on phone  Disposition Plan: TBD   Consultants:  ID  Procedures:  None   Continuous infusions . dextrose 5 % and 0.45% NaCl 75 mL/hr at 04/08/17 1325      Antibiotics:   Anti-infectives (From admission, onward)   Start     Dose/Rate Route Frequency Ordered Stop   04/02/17 1000  piperacillin-tazobactam (ZOSYN) IVPB 3.375 g  Status:  Discontinued     3.375 g 12.5 mL/hr over 240 Minutes Intravenous Every 8 hours 04/02/17 0435 04/02/17 1609   04/02/17 0245  piperacillin-tazobactam (ZOSYN) IVPB 3.375 g     3.375 g 100 mL/hr over 30 Minutes Intravenous NOW 04/02/17 0244 04/02/17 0503   04/02/17 0230  linezolid (ZYVOX) IVPB 600 mg  Status:  Discontinued     600 mg 300 mL/hr over 60 Minutes Intravenous Every 12 hours 04/02/17 0226 04/02/17 1609       Objective   Vitals:   04/07/17 1500 04/07/17 2059 04/08/17 0520 04/08/17 1310  BP:  92/77 (!) 103/58 (!) 122/59  Pulse:  90 93 93  Resp: 18 17 18 18   Temp:  97.9 F (36.6 C) 98.1 F (36.7 C) 98.6 F (37 C)  TempSrc:  Axillary Axillary Oral  SpO2:  98% 99% 99%  Weight:      Height:  Intake/Output Summary (Last 24 hours) at 04/08/2017 1451 Last data filed at 04/08/2017 0943 Gross per 24 hour  Intake 970 ml  Output 400 ml  Net 570 ml   Filed Weights   04/02/17 1141  Weight: 95.3 kg (210 lb)     Physical Examination:  Physical Exam: Eyes: No icterus, extraocular muscles intact  Mouth: Oral mucosa is moist, no lesions on palate,  Neck: Supple, no deformities, masses, or tenderness Lungs: Normal respiratory effort, bilateral clear to auscultation, no crackles or wheezes.  Heart: Regular rate and rhythm, S1 and  S2 normal, no murmurs, rubs auscultated Abdomen: BS normoactive,soft,nondistended,non-tender to palpation,no organomegaly Extremities: No pretibial edema, no erythema, no cyanosis, no clubbing Neuro : Alert and oriented to time, place and person, No focal deficits Skin:        Data Reviewed: I have personally reviewed following labs and imaging studies  CBG: No results for input(s): GLUCAP in the last 168 hours.  CBC: Recent Labs  Lab 04/01/17 2003 04/05/17 0602  WBC 13.9* 11.6*  NEUTROABS 11.2*  --   HGB 10.6* 9.7*  HCT 34.3* 30.4*  MCV 106.5* 103.1*  PLT 336 243    Basic Metabolic Panel: Recent Labs  Lab 04/01/17 2009  04/04/17 0601 04/05/17 0602 04/05/17 1432 04/06/17 0630 04/07/17 0628 04/08/17 0614  NA  --    < > 147* 145  --  144 145 145  K  --    < > 3.0* 2.8*  --  3.2* 2.7* 3.2*  CL  --    < > 114* 115*  --  113* 114* 115*  CO2  --    < > 23 20*  --  19* 21* 22  GLUCOSE  --    < > 103* 97  --  86 103* 95  BUN  --    < > 6 6  --  6 6 5*  CREATININE  --    < > 0.73 0.59  --  0.64 0.59 0.59  CALCIUM  --    < > 8.1* 7.6*  --  7.6* 7.5* 7.7*  MG 2.4  --   --   --  1.9  --   --   --    < > = values in this interval not displayed.    Recent Results (from the past 240 hour(s))  Blood culture (routine x 2)     Status: None   Collection Time: 04/01/17 11:39 PM  Result Value Ref Range Status   Specimen Description BLOOD LEFT ARM  Final   Special Requests   Final    BOTTLES DRAWN AEROBIC AND ANAEROBIC Blood Culture adequate volume Performed at [facility], [address] [place], [place] [address]    Culture   Final    NO GROWTH 5 DAYS Performed at [facility], [address] [place], [place] [address]    Report Status 04/07/2017 FINAL  Final  Urine culture     Status: Abnormal   Collection Time: 04/02/17 12:58 AM  Result Value Ref Range Status   Specimen Description   Final    URINE, RANDOM Performed at [facility], [address] [place], [place] [address]    Special Requests   Final    NONE Performed at [facility], [address] [place], [place] [address]    Culture MULTIPLE SPECIES PRESENT, SUGGEST RECOLLECTION (A)  Final   Report Status 04/03/2017 FINAL  Final  Blood culture (routine x 2)     Status: None   Collection Time: 04/02/17  3:58 AM  Result Value Ref Range Status   Specimen Description   Final    BLOOD LEFT WRIST Performed at [facility], [address], [place], [place] [address]    Special Requests   Final    IN PEDIATRIC BOTTLE Blood Culture adequate volume Performed at [facility], [address] [place], [place] [address]    Culture   Final    NO GROWTH 5 DAYS Performed at [facility], [address], [place], [place] [address]    Report Status 04/07/2017 FINAL  Final     Liver Function Tests: Recent Labs  Lab 04/01/17 2003  AST 17  ALT 12*  ALKPHOS 122  BILITOT 0.5  PROT 7.2  ALBUMIN 2.1*   No results for input(s): LIPASE, AMYLASE in the last 168 hours. No results for input(s): AMMONIA in the last 168 hours.  Cardiac Enzymes: No results for input(s): CKTOTAL, CKMB, CKMBINDEX, TROPONINI in the last 168 hours. BNP (last 3 results) Recent Labs    02/02/17 2131  BNP 89.7    ProBNP (last 3 results) No results for input(s): PROBNP in the last 8760 hours.    Studies: No results found.  Scheduled Meds: . collagenase   Topical Daily  . erythromycin   Right Eye Q8H  . fludrocortisone  0.1 mg Oral Daily  . lamoTRIgine  100 mg Oral Daily  . levETIRAcetam  1,250 mg Oral BID  . levothyroxine  100 mcg Oral QAC breakfast  . memantine  5 mg Oral Daily  . rivaroxaban  20 mg Oral Q supper      Time spent: 35 min   Meredeth Ide   [facility] Pager [phone]. If 7PM-7AM, please contact night-coverage at [email or web address], Office  [phone]  password  [email or web address]  04/08/2017, 2:51 PM  LOS: 6 days

## 2017-04-08 NOTE — Progress Notes (Signed)
LCSW following for LTC medicaid bed.   LCSW spoke with legal guardian, Samuel GermanyMarilyn Drennen, who prefers that patient is back in [place]. Patient does not have family but previous group home staff that are like family that would visit patient. Patient has only been in [place] a few months.   LCSW explained the transportation process for facilities greater than 50 miles. Guardian is agreeable to locating facilities in the local area.   LCSW faxed patient out to local facilities.   LCSW will continue to follow.   Beulah GandyBernette Anastasia Tompson, LSCW [facility] [phone]

## 2017-04-08 NOTE — Progress Notes (Signed)
Daily Progress Note   Patient Name: Brooke Palmer       Date: 04/08/2017 DOB: 1961/01/24  Age: 57 y.o. MRN#: [id number] Attending Physician: Meredeth Ide, MD Primary Care Physician: Lucretia Field Admit Date: 04/01/2017  Reason for Consultation/Follow-up: Establishing goals of care  Subjective:  Patient is resting in bed Eating anywhere from 25-75% of meals as per chart review Discussed with [facility] MD, CSW notes reviewed and agree.     Length of Stay: 6  Current Medications: Scheduled Meds:  . collagenase   Topical Daily  . erythromycin   Right Eye Q8H  . fludrocortisone  0.1 mg Oral Daily  . lamoTRIgine  100 mg Oral Daily  . levETIRAcetam  1,250 mg Oral BID  . levothyroxine  100 mcg Oral QAC breakfast  . memantine  5 mg Oral Daily  . rivaroxaban  20 mg Oral Q supper    Continuous Infusions: . dextrose 5 % and 0.45% NaCl 75 mL/hr at 04/07/17 1102  . potassium chloride 10 mEq (04/08/17 1031)    PRN Meds: LORazepam, morphine injection, ondansetron **OR** ondansetron (ZOFRAN) IV  Physical Exam         Awake, some what more alert In mild distress Blinks her eyes, raises her eye brows, other wise, is non communicative.  Regular S1 S2 Abdomen soft Wound pictures noted in Epic chart.   Vital Signs: BP (!) 103/58 (BP Location: Right Arm)   Pulse 93   Temp 98.1 F (36.7 C) (Axillary)   Resp 18   Ht 5\' 3"  (1.6 m)   Wt 95.3 kg (210 lb)   SpO2 99%   BMI 37.20 kg/m  SpO2: SpO2: 99 % O2 Device: O2 Device: Not Delivered O2 Flow Rate:    Intake/output summary:   Intake/Output Summary (Last 24 hours) at 04/08/2017 1145 Last data filed at 04/08/2017 4098 Gross per 24 hour  Intake 970 ml  Output 400 ml  Net 570 ml   LBM: Last BM Date: 04/07/17 Baseline Weight: Weight:  95.3 kg (210 lb) Most recent weight: Weight: 95.3 kg (210 lb)       Palliative Assessment/Data: PPS 20-30%     Patient Active Problem List   Diagnosis Date Noted  . Sacral wound   . Encounter for palliative care   . Goals of care, counseling/discussion   .  Sacral decubitus ulcer 04/02/2017  . Mental disability 04/02/2017  . Hypernatremia 03/07/2017  . Anemia 03/07/2017  . Seizure disorder (HCC) 02/02/2017  . Hypothyroid 02/02/2017  . GERD (gastroesophageal reflux disease) 02/02/2017  . DVT (deep venous thrombosis) (HCC) 02/02/2017    Palliative Care Assessment & Plan   Patient Profile:    Assessment:  Sacral Decubitus ulcer Down's syndrome Hypokalemia History of Seizure disorder.  History of DVT  Recommendations/Plan:   SNF on discharge, with hospice services.   Continue current mode of care.   DNR DNI as per my discussions with guardian Lise AuerMarilyn Drennan.   Gentle wound care  Careful PO feedings.   Consider residential hospice, based on the patient's decline trajectory once she is in a SNF.      Code Status:    Code Status Orders  (From admission, onward)        Start     Ordered   04/02/17 0608  Full code  Continuous     04/02/17 0607    Code Status History    Date Active Date Inactive Code Status Order ID Comments User Context   03/07/2017 18:46 03/11/2017 17:12 Full Code [id number]  Pearson GrippeKim, James, MD ED   02/03/2017 02:31 02/07/2017 19:12 Full Code [id number]  Rometta EmeryGarba, Mohammad L, MD Inpatient       Prognosis:  guarded but not imminently dying, does not appear to be residential hospice eligible, at the moment, see above.  Discharge Planning:  SNF with hospice, will need residential hospice later on, depending on disease trajectory.   [facility]  Thank you for allowing the Palliative Medicine Team to assist in the care of this patient.   Time In: 10.30 Time Out: 11.05 Total Time 35  Prolonged Time Billed  no         Greater than 50%  of this time was spent counseling and coordinating care related to the above assessment and plan.  Rosalin HawkingZeba Kristyn Obyrne, MD [phone]  Please contact Palliative Medicine Team phone at [phone] for questions and concerns.

## 2017-04-08 NOTE — Progress Notes (Signed)
Patient ID: Darrol AngelCarolyn Aultman, female   DOB: 02/24/1960, 57 y.o.   MRN: [id number]         [facility]    Date of Admission:  04/01/2017     Her guardian has made a decision to provide comfort measures only.  I am in agreement with that plan.  I will sign off now.         Cliffton AstersJohn Ozzy Bohlken, MD [facility] [phone] pager   [phone] cell 04/08/2017, 9:34 AM

## 2017-04-09 LAB — BASIC METABOLIC PANEL
Anion gap: 9 (ref 5–15)
CALCIUM: 7.6 mg/dL — AB (ref 8.9–10.3)
CHLORIDE: 113 mmol/L — AB (ref 101–111)
CO2: 20 mmol/L — AB (ref 22–32)
CREATININE: 0.59 mg/dL (ref 0.44–1.00)
GFR calc non Af Amer: >60 mL/min (ref >60)
GLUCOSE: 90 mg/dL (ref 65–99)
Potassium: 3.1 mmol/L — ABNORMAL LOW (ref 3.5–5.1)
Sodium: 142 mmol/L (ref 135–145)

## 2017-04-09 MED ORDER — POTASSIUM CHLORIDE 10 MEQ/100ML IV SOLN
10.0000 meq | INTRAVENOUS | Status: AC
Start: 2017-04-09 — End: 2017-04-09
  Administered 2017-04-09 (×3): 10 meq via INTRAVENOUS
  Filled 2017-04-09 (×3): qty 100

## 2017-04-09 NOTE — Progress Notes (Signed)
This shift RN notified that pt retaining urine. Bladder scan showing > 700 MLS. Attending notified. In and out catheter resulted in 1100 mls urine. Pt tolerated well. Will continue to monitor.

## 2017-04-09 NOTE — Progress Notes (Signed)
[facility]  PROGRESS NOTE  Brooke Palmer ZOX:[id number] DOB: 04-01-1960 DOA: 04/01/2017 PCP: Lucretia Field   Brief HPI:     57 y.o. female with medical history significant for mental disability, [name], Seizure disorder, hypothyroidism, DVT,  brought to the ED from the wound care center. Employee of the group home present at bedside is not able to give a history, patient at baseline is nonverbal.  She is obtained from chart review and a few notes from wound care center and group home.  Patient's was reportedly lethargic, temperature 100.4 at wound care center, with sacral decubitus ulcer that looked worse.  Patient at baseline does not walk- ambulates with wheelchair.   She was admitted 03/07/2016 for sepsis though to be 2/2 PNA, also consideration for sacral ulcer as etiology but per chart, at that time wound did not appear to be infected.     Subjective   Patient seen and examined, she has fair p.o. intake.   Assessment/Plan:     1. Sepsis-resolved, secondary to sacral decubitus ulcer, patient  was started on linezolid and Zosyn per pharmacy. Pelvic x-ray shows no acute osseous abnormality. ID was consulted, antibiotics were discontinued by ID. Recommend goals of care due to worsening clinical status over past few months. Patient has had three admissions over past two months. Continue local wound care. 2. Altered mental status-patient has history of Down syndrome with developmental delay. Likely altered mental status worsening due to the infection as above. 3. Hypokalemia-potassium is 3.1 today, will replace potassium and check BMP in a.m.   4. Hypernatremia-sodium is 142 he is going well we will continue hopefully to think serious may be somebody you do the year #2 post appears to the delivery was made for you initially was barely Sunday Corn: This today. IV fluids were changed to D 5/2 normal saline at 75 ML per hour. Follow BMP in am. 5. DVT-Xarelto has been  restarted. 6. Seizure disorder-continue IV Keppra, Lamictal 7. Goals of care-called and discussed with patient's legal guardianMarilyn Palmer , she agreed for goals of care meeting. Palliative care was consulted, and at this time comfort measures initiated, patient has been eating fairly well, will plan for skilled nursing facility with hospice services      DVT prophylaxis: Heparin  Code Status: Full code  Family Communication: Discussed with Legal guardian on phone  Disposition Plan: TBD   Consultants:  ID  Procedures:  None   Continuous infusions . dextrose 5 % and 0.45% NaCl 75 mL/hr at 04/09/17 0325      Antibiotics:   Anti-infectives (From admission, onward)   Start     Dose/Rate Route Frequency Ordered Stop   04/02/17 1000  piperacillin-tazobactam (ZOSYN) IVPB 3.375 g  Status:  Discontinued     3.375 g 12.5 mL/hr over 240 Minutes Intravenous Every 8 hours 04/02/17 0435 04/02/17 1609   04/02/17 0245  piperacillin-tazobactam (ZOSYN) IVPB 3.375 g     3.375 g 100 mL/hr over 30 Minutes Intravenous NOW 04/02/17 0244 04/02/17 0503   04/02/17 0230  linezolid (ZYVOX) IVPB 600 mg  Status:  Discontinued     600 mg 300 mL/hr over 60 Minutes Intravenous Every 12 hours 04/02/17 0226 04/02/17 1609       Objective   Vitals:   04/08/17 1310 04/08/17 2029 04/09/17 0508 04/09/17 1359  BP: (!) 122/59 104/75 116/80 130/81  Pulse: 93 98 94 78  Resp: 18 18 18 20   Temp: 98.6 F (37 C) 98.5 F (36.9 C) 98.2 F (  36.8 C) 98.3 F (36.8 C)  TempSrc: Oral Oral Oral Oral  SpO2: 99% 92% 94% 92%  Weight:   63.5 kg (140 lb)   Height:        Intake/Output Summary (Last 24 hours) at 04/09/2017 1426 Last data filed at 04/09/2017 1240 Gross per 24 hour  Intake 850 ml  Output -  Net 850 ml   Filed Weights   04/02/17 1141 04/09/17 0508  Weight: 95.3 kg (210 lb) 63.5 kg (140 lb)     Physical Examination:  Physical Exam: Eyes: No icterus, extraocular muscles intact   Mouth: Oral mucosa is moist, no lesions on palate,  Neck: Supple, no deformities, masses, or tenderness Lungs: Normal respiratory effort, bilateral clear to auscultation, no crackles or wheezes.  Heart: Regular rate and rhythm, S1 and S2 normal, no murmurs, rubs auscultated Abdomen: BS normoactive,soft,nondistended,non-tender to palpation,no organomegaly Extremities: No pretibial edema, no erythema, no cyanosis, no clubbing Neuro : Alert and oriented to time, place and person, No focal deficits Skin:        Data Reviewed: I have personally reviewed following labs and imaging studies  CBG: No results for input(s): GLUCAP in the last 168 hours.  CBC: Recent Labs  Lab 04/05/17 0602  WBC 11.6*  HGB 9.7*  HCT 30.4*  MCV 103.1*  PLT 243    Basic Metabolic Panel: Recent Labs  Lab 04/05/17 0602 04/05/17 1432 04/06/17 0630 04/07/17 0628 04/08/17 0614 04/09/17 0612  NA 145  --  144 145 145 142  K 2.8*  --  3.2* 2.7* 3.2* 3.1*  CL 115*  --  113* 114* 115* 113*  CO2 20*  --  19* 21* 22 20*  GLUCOSE 97  --  86 103* 95 90  BUN 6  --  6 6 5* <5*  CREATININE 0.59  --  0.64 0.59 0.59 0.59  CALCIUM 7.6*  --  7.6* 7.5* 7.7* 7.6*  MG  --  1.9  --   --   --   --     Recent Results (from the past 240 hour(s))  Blood culture (routine x 2)     Status: None   Collection Time: 04/01/17 11:39 PM  Result Value Ref Range Status   Specimen Description BLOOD LEFT ARM  Final   Special Requests   Final    BOTTLES DRAWN AEROBIC AND ANAEROBIC Blood Culture adequate volume Performed at [facility], [address], [place], [place] [address]    Culture   Final    NO GROWTH 5 DAYS Performed at [facility], [address], [place], [place] [address]    Report Status 04/07/2017 FINAL  Final  Urine culture     Status: Abnormal   Collection Time: 04/02/17 12:58 AM  Result Value Ref Range Status   Specimen Description   Final    URINE, RANDOM Performed at [facility], [address], [place], [place] [address]    Special Requests   Final    NONE Performed at [facility], [address] [place], [place] [address]    Culture MULTIPLE SPECIES PRESENT, SUGGEST RECOLLECTION (A)  Final   Report Status 04/03/2017 FINAL  Final  Blood culture (routine x 2)     Status: None   Collection Time: 04/02/17  3:58 AM  Result Value Ref Range Status   Specimen Description   Final    BLOOD LEFT WRIST Performed at [facility], [address] [place], [place] [address]    Special Requests   Final    IN PEDIATRIC BOTTLE Blood Culture adequate volume Performed at [facility], [address] [place], [place] [address]    Culture   Final    NO GROWTH 5 DAYS Performed at [facility], [address] [place], [place] [address]    Report Status 04/07/2017 FINAL  Final     Liver Function Tests: No results for input(s): AST, ALT, ALKPHOS, BILITOT, PROT, ALBUMIN in the last 168 hours. No results for input(s): LIPASE, AMYLASE in the last 168 hours. No results for input(s): AMMONIA in the last 168 hours.  Cardiac Enzymes: No results for input(s): CKTOTAL, CKMB, CKMBINDEX, TROPONINI in the last 168 hours. BNP (last 3 results) Recent Labs    02/02/17 2131  BNP 89.7    ProBNP (last 3 results) No results for input(s): PROBNP in the last 8760 hours.    Studies: No results found.  Scheduled Meds: . collagenase   Topical Daily  . erythromycin   Right Eye Q8H  . fludrocortisone  0.1 mg Oral Daily  . lamoTRIgine  100 mg Oral Daily  . levETIRAcetam  1,250 mg Oral BID  . levothyroxine  100 mcg Oral QAC breakfast  . memantine  5 mg Oral Daily  . rivaroxaban  20 mg Oral Q supper      Time spent: 35 min   Meredeth IdeGagan S Madisen Ludvigsen   [facility] Pager [phone]. If 7PM-7AM, please contact night-coverage at [email or web address], Office  [phone]  password [email or web address]  04/09/2017,  2:26 PM  LOS: 7 days

## 2017-04-09 NOTE — Progress Notes (Signed)
CSW received call from pt's legal guardian M. Drennon inquiring about bed offers for potential SNF placement for pt. [facility] has made bed offer and guardian reports she wants to accept. CSW spoke with admissions at SNF and confirmed pt's bed offer- was informed that guardian will need to do admission paperwork for SNF but that they are flexible and will work it out with her as guardian is out of local area. Guardian agreeable to this. CSW will follow to assist with planned transition to SNF at DC.  Ilean SkillMeghan Rushil Kimbrell, MSW, LCSW Clinical Social Work 04/09/2017 [phone] coverage for [phone]

## 2017-04-10 DIAGNOSIS — Z7189 Other specified counseling: Secondary | ICD-10-CM

## 2017-04-10 DIAGNOSIS — I824Y3 Acute embolism and thrombosis of unspecified deep veins of proximal lower extremity, bilateral: Secondary | ICD-10-CM

## 2017-04-10 DIAGNOSIS — E870 Hyperosmolality and hypernatremia: Secondary | ICD-10-CM

## 2017-04-10 DIAGNOSIS — Z515 Encounter for palliative care: Secondary | ICD-10-CM

## 2017-04-10 DIAGNOSIS — L89150 Pressure ulcer of sacral region, unstageable: Secondary | ICD-10-CM

## 2017-04-10 DIAGNOSIS — E876 Hypokalemia: Secondary | ICD-10-CM

## 2017-04-10 LAB — BASIC METABOLIC PANEL
Anion gap: 6 (ref 5–15)
BUN: <5 mg/dL — ABNORMAL LOW (ref 6–20)
CHLORIDE: 112 mmol/L — AB (ref 101–111)
CO2: 24 mmol/L (ref 22–32)
Calcium: 7.9 mg/dL — ABNORMAL LOW (ref 8.9–10.3)
Creatinine, Ser: 0.66 mg/dL (ref 0.44–1.00)
GFR calc non Af Amer: >60 mL/min (ref >60)
Glucose, Bld: 89 mg/dL (ref 65–99)
POTASSIUM: 3.2 mmol/L — AB (ref 3.5–5.1)
Sodium: 142 mmol/L (ref 135–145)

## 2017-04-10 MED ORDER — ACETAMINOPHEN 650 MG RE SUPP
650.0000 mg | Freq: Once | RECTAL | Status: AC
  Administered 2017-04-10: 650 mg via RECTAL
  Filled 2017-04-10: qty 1

## 2017-04-10 MED ORDER — POTASSIUM CHLORIDE CRYS ER 20 MEQ PO TBCR: 20.0000 meq | EXTENDED_RELEASE_TABLET | Freq: Every day | ORAL | 0 refills | Status: AC

## 2017-04-10 MED ORDER — POTASSIUM CHLORIDE 20 MEQ/15ML (10%) PO SOLN
40.0000 meq | ORAL | Status: DC
  Filled 2017-04-10: qty 30

## 2017-04-10 MED ORDER — POTASSIUM CHLORIDE 10 MEQ/100ML IV SOLN
10.0000 meq | INTRAVENOUS | Status: AC
  Administered 2017-04-10 (×4): 10 meq via INTRAVENOUS
  Filled 2017-04-10 (×4): qty 100

## 2017-04-10 NOTE — Progress Notes (Signed)
Called report to SNF [facility]. Gave report to RN

## 2017-04-10 NOTE — [place] FL2 (Signed)
[place] MEDICAID FL2 LEVEL OF CARE SCREENING TOOL     IDENTIFICATION  Patient Name: Brooke Palmer Birthdate: 09/26/1960 Sex: female Admission Date (Current Location): 04/01/2017  [facility] and [place] Number:  Producer, television/film/video and Address:  [facility],  [address] [place], [place] [address]      Provider Number: [id number]  Attending Physician Name and Address:  Coralie Keens  Relative Name and Phone Number:       Current Level of Care: Hospital Recommended Level of Care: Skilled Nursing Facility Prior Approval Number:    Date Approved/Denied: 04/10/17 PASRR Number: [id number]  Discharge Plan: SNF    Current Diagnoses: Patient Active Problem List   Diagnosis Date Noted  . Sacral wound   . Encounter for palliative care   . Goals of care, counseling/discussion   . Sacral decubitus ulcer 04/02/2017  . Mental disability 04/02/2017  . Hypernatremia 03/07/2017  . Anemia 03/07/2017  . Seizure disorder (HCC) 02/02/2017  . Hypothyroid 02/02/2017  . GERD (gastroesophageal reflux disease) 02/02/2017  . DVT (deep venous thrombosis) (HCC) 02/02/2017    Orientation RESPIRATION BLADDER Height & Weight     Self  Normal Incontinent, External catheter Weight: 140 lb (63.5 kg) Height:  5\' 3"  (160 cm)  BEHAVIORAL SYMPTOMS/MOOD NEUROLOGICAL BOWEL NUTRITION STATUS      Continent Diet(See dc summary)  AMBULATORY STATUS COMMUNICATION OF NEEDS Skin   Total Care Non-Verbally PU Stage and Appropriate Care(buttocks unstagable right mid, lower leg lateral wound, Buttocks right stage II, right foot posterior )                       Personal Care Assistance Level of Assistance  Total care           Functional Limitations Info  Sight, Hearing, Speech     Speech Info: Impaired(Nonverbal)    SPECIAL CARE FACTORS FREQUENCY                       Contractures Contractures Info: Not present    Additional Factors Info  Code Status,  Allergies Code Status Info: DNR Allergies Info: Vancomycin           Current Medications (04/10/2017):  This is the current hospital active medication list Current Facility-Administered Medications  Medication Dose Route Frequency Provider Last Rate Last Dose  . collagenase (SANTYL) ointment   Topical Daily Meredeth Ide, MD   1 application at 04/10/17 1109  . dextrose 5 %-0.45 % sodium chloride infusion   Intravenous Continuous Otho Bellows, RPH 75 mL/hr at 04/09/17 0325    . erythromycin ophthalmic ointment   Right Eye Q8H Emokpae, Ejiroghene E, MD      . fludrocortisone (FLORINEF) tablet 0.1 mg  0.1 mg Oral Daily Meredeth Ide, MD   0.1 mg at 04/09/17 1233  . lamoTRIgine (LAMICTAL) tablet 100 mg  100 mg Oral Daily Meredeth Ide, MD   100 mg at 04/09/17 1233  . levETIRAcetam (KEPPRA) 100 MG/ML solution 1,250 mg  1,250 mg Oral BID Meredeth Ide, MD   1,250 mg at 04/09/17 2135  . levothyroxine (SYNTHROID, LEVOTHROID) tablet 100 mcg  100 mcg Oral QAC breakfast Meredeth Ide, MD   100 mcg at 04/08/17 1045  . LORazepam (ATIVAN) injection 1-2 mg  1-2 mg Intravenous Q4H PRN Emokpae, Ejiroghene E, MD   2 mg at 04/10/17 0115  . memantine (NAMENDA) tablet 5 mg  5 mg Oral Daily  Meredeth IdeLama, Gagan S, MD   5 mg at 04/09/17 1233  . morphine 4 MG/ML injection 1-2 mg  1-2 mg Intravenous Q4H PRN Macon LargeBodenheimer, Charles A, NP   2 mg at 04/09/17 2136  . ondansetron (ZOFRAN) tablet 4 mg  4 mg Oral Q6H PRN Emokpae, Ejiroghene E, MD       Or  . ondansetron (ZOFRAN) injection 4 mg  4 mg Intravenous Q6H PRN Emokpae, Ejiroghene E, MD      . potassium chloride 10 mEq in 100 mL IVPB  10 mEq Intravenous Q1 Hr x 4 Arrien, [place] RamMauricio Daniel, MD 100 mL/hr at 04/10/17 1229 10 mEq at 04/10/17 1229  . rivaroxaban (XARELTO) tablet 20 mg  20 mg Oral Q supper Meredeth IdeLama, Gagan S, MD   20 mg at 04/09/17 2135     Discharge Medications: Please see discharge summary for a list of discharge medications.  Relevant Imaging Results:  Relevant  Lab Results:   Additional Information ssn: [id number]  Coralyn HellingBernette Deb [place], LCSW

## 2017-04-10 NOTE — Progress Notes (Signed)
[facility]. Patient's legal guardian is 2.5 hours away and will need to complete paperwork with facility.   LCSW has attempted multiple times to reach facility to coordinate time for guardian to complete paperwork. LCSW left message for DON, Kim at facility.    LCSW will continue to follow.   Brooke GandyBernette Joshue Palmer, LSCW [facility] CSW [phone]

## 2017-04-10 NOTE — Clinical Social Work Placement (Signed)
    1:03 PM Patients Guardian [facility].  LCSW confirmed bed with facility.   LCSW notified guardian.  LCSW faxed dc documents to facility.   Patient will transport by [facility].   RN report number: [id number]   CLINICAL SOCIAL WORK PLACEMENT  NOTE  Date:  04/10/2017  Patient Details  Name: Brooke Palmer MRN: [id number] Date of Birth: 03/09/1960  Clinical Social Work is seeking post-discharge placement for this patient at the Skilled  Nursing Facility level of care (*CSW will initial, date and re-position this form in  chart as items are completed):  Yes   Patient/family provided with [place] Clinical Social Work Department's list of facilities offering this level of care within the geographic area requested by the patient (or if unable, by the patient's family).  Yes   Patient/family informed of their freedom to choose among providers that offer the needed level of care, that participate in Medicare, Medicaid or managed care program needed by the patient, have an available bed and are willing to accept the patient.  Yes   Patient/family informed of [place]'s ownership interest in [facility] and [facility], as well as of the fact that they are under no obligation to receive care at these facilities.  PASRR submitted to EDS on       PASRR number received on 04/10/17     Existing PASRR number confirmed on       FL2 transmitted to all facilities in geographic area requested by pt/family on 04/05/17     FL2 transmitted to all facilities within larger geographic area on       Patient informed that his/her managed care company has contracts with or will negotiate with certain facilities, including the following:        Yes   Patient/family informed of bed offers received.  [facility]     Physician recommends and patient [facility]    Patient to be transferred to   on 04/10/17.  Patient to be transferred to  facility by EMS     Patient family notified on 04/10/17 of transfer.  Name of family member notified:  Brooke Palmer, guardian     PHYSICIAN Please sign FL2     Additional Comment:    _______________________________________________ Coralyn HellingBernette Santez [place], LCSW 04/10/2017, 1:03 PM

## 2017-04-10 NOTE — Discharge Summary (Addendum)
Physician Discharge Summary  Brooke Palmer QMV:[id number] DOB: [birth year] DOA: 04/01/2017  PCP: Lucretia Field  Admit date: 04/01/2017 Discharge date: 04/10/2017  Admitted From: snf Disposition:  snf  Recommendations for Outpatient Follow-up and new medication changes:  1. Follow up with PCP in 1-week 2. Patient is discharge on comfort mesures 3. Will continue potassium chloride for the next 5 days, 20 mg daily, follow-up kidney function.  Home Health: no  Equipment/Devices: no   Discharge Condition: stable CODE STATUS: DNR  Diet recommendation:  Heart healthy  Brief/Interim Summary: 57 year old female who presented with lethargy and fever.  She does have a significant past medical history of Down syndrome, seizure disorder, hypothyroidism, and deep vein thrombosis. Patient is nonverbal, most of the history obtained from medical record, it was reported that the patient was lethargic, her temperature reached 100.4 degrees, she does have a sacral decubitus ulcer that apparently has been worsening.  On initial physical examination blood pressure 104/60, heart rate 95, respiratory 25, oxygen saturation 100%, she had dry mucous membranes, her lungs were clear to auscultation bilaterally, heart S1-S2 present rhythmic, the abdomen was soft, nontender, she had a large decubitus ulcer, stage IV 7 x 7 cm.  Please see picture documented.  Sodium 158, potassium 2.7, chloride 121, bicarb 25, glucose 125, BUN 14, creatinine 0.86, calcium 7.9, white count 11.6, hemoglobin 9.7, hematocrit 30.4, platelets 243.  Urinalysis had 6-30 white cells, specific gravity 1.024.  Her chest x-ray was negative for infiltrates.  EKG sinus tachycardia, 98 bpm, normal axis, normal intervals, poor R wave progression.  Pelvic films with no acute osseous abnormality.  Patient was admitted to the hospital with the working diagnosis of sepsis due to decubitus ulcer infection complicated by hypovolemic hypernatremia, hypokalemia and  metabolic encephalopathy.  1.  Sepsis due to the decubitus ulcer infection.  Patient was admitted to the medical ward, she was placed on a remote telemetry monitor, started on broad-spectrum intravenous antibiotic therapy with linezolid and Zosyn.  Blood cultures were no growth.  Patient has a very poor prognosis, nonambulatory, with recurrent wound infections, infectious disease was consulted, and considering her poor prognosis and advanced directives, it was recommended to discontinue IV antibiotics, continue local wound care, and provide palliative care/comfort measures.  Patient was seen by wound care team.  2.  Metabolic encephalopathy.  Likely exacerbated by acute sepsis, continue comfort measures, palliative care.  Encourage p.o. as tolerated.  3.  Hypernatremia with hypokalemia.  Patient received IV fluids with D5 water and hypotonic saline, her discharge sodium is down to 142.  Potassium was repleted with potassium chloride, potassium at discharge 3.2, she will receive 40 mEq before discharge of potassium chloride.  Renal function remained stable with a serum creatinine of 0.66 at discharge.  4.  Seizure disorder.  Patient will continue Keppra and Lamictal, no clinical signs of active seizures.  Continue as needed diazepam.   5.  Deep vein thrombosis from the lower extremity (not specified laterality).  It has been placed on rivaroxaban for anticoagulation.Marland Kitchen    Discharge Diagnoses:  Principal Problem:   Sacral decubitus ulcer Active Problems:   Seizure disorder (HCC)   Hypothyroid   DVT (deep venous thrombosis) (HCC)   Hypernatremia   Anemia   Mental disability   Sacral wound   Encounter for palliative care   Goals of care, counseling/discussion    Discharge Instructions   Allergies as of 04/10/2017      Reactions   Vancomycin Hives  Medication List    STOP taking these medications   doxycycline 100 MG capsule Commonly known as:  VIBRAMYCIN   erythromycin  ophthalmic ointment     TAKE these medications   acetic acid 2 % otic solution Place 4 drops into both ears 2 (two) times daily.   acetic acid-hydrocortisone OTIC solution Commonly known as:  VOSOL-HC Place 4 drops into both ears 3 (three) times daily as needed (ear infection prevention).   carbamide peroxide 6.5 % OTIC solution Commonly known as:  DEBROX Place 5 drops into both ears 2 (two) times daily as needed (ear infection prevention).   coal tar 0.5 % shampoo Commonly known as:  NEUTROGENA T-GEL Apply 1 application topically at bedtime as needed (scalp, dandruff).   collagenase ointment Commonly known as:  SANTYL Apply 1 application topically daily.   diazepam 10 MG Gel Commonly known as:  DIASTAT ACUDIAL Place 10 mg rectally daily as needed for seizure.   docusate sodium 100 MG capsule Commonly known as:  COLACE Take 100 mg by mouth daily.   fludrocortisone 0.1 MG tablet Commonly known as:  FLORINEF Take 0.1 mg by mouth daily.   lamoTRIgine 100 MG tablet Commonly known as:  LAMICTAL Take 100 mg by mouth daily.   levETIRAcetam 250 MG tablet Commonly known as:  KEPPRA Take 1,250 mg by mouth 2 (two) times daily. What changed:  Another medication with the same name was removed. Continue taking this medication, and follow the directions you see here.   levothyroxine 100 MCG tablet Commonly known as:  SYNTHROID, LEVOTHROID Take 100 mcg by mouth daily.   Melatonin 3 MG Tabs Take 3 mg by mouth at bedtime.   memantine 5 MG tablet Commonly known as:  NAMENDA Take 5 mg by mouth daily.   montelukast 10 MG tablet Commonly known as:  SINGULAIR Take 10 mg by mouth daily.   omeprazole 20 MG capsule Commonly known as:  PRILOSEC Take 20 mg by mouth daily.   OSCAL 500/200 D-3 PO Take 1 tablet by mouth 2 (two) times daily.   phenylephrine-shark liver oil-mineral oil-petrolatum 0.25-3-14-71.9 % rectal ointment Commonly known as:  PREPARATION H Place 1 application  rectally 2 (two) times daily as needed for hemorrhoids.   potassium chloride SA 20 MEQ tablet Commonly known as:  K-DUR,KLOR-CON Take 1 tablet (20 mEq total) by mouth daily.   PREPARATION H 5-14.4 % Crea Generic drug:  Lidocaine-Glycerin Place 1 application rectally daily as needed (hemorrhoids).   RISAMINE 0.44-20.625 % Oint Generic drug:  Menthol-Zinc Oxide Apply 1 application topically 2 (two) times daily.   rivaroxaban 20 MG Tabs tablet Commonly known as:  XARELTO Take 20 mg by mouth daily with supper.   Vitamin D3 2000 units Tabs Take 2,000 Units by mouth daily after breakfast.      Contact information for after-discharge care    Destination    [facility] .   Service:  Skilled Nursing Contact information: [address] [place] [address] [phone]             Allergies  Allergen Reactions  . Vancomycin Hives    Consultations:  Infectious disease  Palliative care   Procedures/Studies: Dg Chest 2 View  Result Date: 04/01/2017 CLINICAL DATA:  Fever and altered mentation EXAM: CHEST  2 VIEW COMPARISON:  03/07/2017 FINDINGS: Heart and mediastinal contours are stable and within normal limits. Mild central vascular congestion without acute pneumonic consolidation, effusion or pneumothorax. Mild increase in interstitial prominence of the lung  bases are stable. No acute osseous abnormality. IMPRESSION: Mild central vascular congestion without acute pneumonic consolidation. Mild increase in interstitial markings at bases appear chronic likely to reflect changes of mild chronic interstitial disease. Electronically Signed   By: Tollie Ethavid  Kwon M.D.   On: 04/01/2017 23:12   Dg Pelvis 1-2 Views  Result Date: 04/02/2017 CLINICAL DATA:  Pressure ulcer in the sacral area EXAM: PELVIS - 1-2 VIEW COMPARISON:  None. FINDINGS: Limited by positioning. Pubic symphysis and rami are intact. Both femoral heads project in joint. No acute fracture.  IMPRESSION: No acute osseous abnormality allowing for positioning. Electronically Signed   By: Jasmine PangKim  Fujinaga M.D.   On: 04/02/2017 03:00       Subjective: Patient is nonverbal, not in acute distress, her p.o. intake has been intermittent.  No apparent nausea, vomiting, pain or dyspnea  Discharge Exam: Vitals:   04/09/17 2116 04/10/17 0547  BP: 103/83 101/68  Pulse: 91 (!) 107  Resp: 18 19  Temp: 98.4 F (36.9 C) 99.7 F (37.6 C)  SpO2: 99% 98%   Vitals:   04/09/17 0508 04/09/17 1359 04/09/17 2116 04/10/17 0547  BP: 116/80 130/81 103/83 101/68  Pulse: 94 78 91 (!) 107  Resp: 18 20 18 19   Temp: 98.2 F (36.8 C) 98.3 F (36.8 C) 98.4 F (36.9 C) 99.7 F (37.6 C)  TempSrc: Oral Oral Oral Axillary  SpO2: 94% 92% 99% 98%  Weight: 63.5 kg (140 lb)     Height:        General: Not in pain or dyspnea, deconditioned Neurology: Somnolent.  E ENT: mild pallor, no icterus, oral mucosa moist Cardiovascular: No JVD. S1-S2 present, rhythmic, no gallops, rubs, or murmurs. No lower extremity edema. Pulmonary: vesicular breath sounds bilaterally, adequate air movement, no wheezing, rhonchi or rales. Decreased breath sounds at bases Gastrointestinal. Abdomen protuberant, no organomegaly, non tender, no rebound or guarding Skin. Stage 3 decubitus ulcer, 7 by 7 cm.  Musculoskeletal: no joint deformities     The results of significant diagnostics from this hospitalization (including imaging, microbiology, ancillary and laboratory) are listed below for reference.     Microbiology: Recent Results (from the past 240 hour(s))  Blood culture (routine x 2)     Status: None   Collection Time: 04/01/17 11:39 PM  Result Value Ref Range Status   Specimen Description BLOOD LEFT ARM  Final   Special Requests   Final    BOTTLES DRAWN AEROBIC AND ANAEROBIC Blood Culture adequate volume Performed at [facility], [address] [place], [place] [address]    Culture   Final      NO GROWTH 5 DAYS Performed at [facility], [address] [place], [place] [address]    Report Status 04/07/2017 FINAL  Final  Urine culture     Status: Abnormal   Collection Time: 04/02/17 12:58 AM  Result Value Ref Range Status   Specimen Description   Final    URINE, RANDOM Performed at [facility], [address] [place], [place] [address]    Special Requests   Final    NONE Performed at [facility], [address] [place], [place] [address]    Culture MULTIPLE SPECIES PRESENT, SUGGEST RECOLLECTION (A)  Final   Report Status 04/03/2017 FINAL  Final  Blood culture (routine x 2)     Status: None   Collection Time: 04/02/17  3:58 AM  Result Value Ref Range Status   Specimen Description  Final    BLOOD LEFT WRIST Performed at [facility], [address] [place], [place] [address]    Special Requests   Final    IN PEDIATRIC BOTTLE Blood Culture adequate volume Performed at [facility], [address], [place], [place] [address]    Culture   Final    NO GROWTH 5 DAYS Performed at [facility], [address] [place], [place] [address]    Report Status 04/07/2017 FINAL  Final     Labs: BNP (last 3 results) Recent Labs    02/02/17 2131  BNP 89.7   Basic Metabolic Panel: Recent Labs  Lab 04/05/17 1432 04/06/17 0630 04/07/17 0628 04/08/17 0614 04/09/17 0612 04/10/17 0604  NA  --  144 145 145 142 142  K  --  3.2* 2.7* 3.2* 3.1* 3.2*  CL  --  113* 114* 115* 113* 112*  CO2  --  19* 21* 22 20* 24  GLUCOSE  --  86 103* 95 90 89  BUN  --  6 6 5* <5* <5*  CREATININE  --  0.64 0.59 0.59 0.59 0.66  CALCIUM  --  7.6* 7.5* 7.7* 7.6* 7.9*  MG 1.9  --   --   --   --   --    Liver Function Tests: No results for input(s): AST, ALT, ALKPHOS, BILITOT, PROT, ALBUMIN in the last 168 hours. No results for input(s): LIPASE, AMYLASE in the last 168 hours. No results for input(s):  AMMONIA in the last 168 hours. CBC: Recent Labs  Lab 04/05/17 0602  WBC 11.6*  HGB 9.7*  HCT 30.4*  MCV 103.1*  PLT 243   Cardiac Enzymes: No results for input(s): CKTOTAL, CKMB, CKMBINDEX, TROPONINI in the last 168 hours. BNP: Invalid input(s): POCBNP CBG: No results for input(s): GLUCAP in the last 168 hours. D-Dimer No results for input(s): DDIMER in the last 72 hours. Hgb A1c No results for input(s): HGBA1C in the last 72 hours. Lipid Profile No results for input(s): CHOL, HDL, LDLCALC, TRIG, CHOLHDL, LDLDIRECT in the last 72 hours. Thyroid function studies No results for input(s): TSH, T4TOTAL, T3FREE, THYROIDAB in the last 72 hours.  Invalid input(s): FREET3 Anemia work up No results for input(s): VITAMINB12, FOLATE, FERRITIN, TIBC, IRON, RETICCTPCT in the last 72 hours. Urinalysis    Component Value Date/Time   COLORURINE YELLOW 04/02/2017 0058   APPEARANCEUR CLEAR 04/02/2017 0058   LABSPEC 1.024 04/02/2017 0058   PHURINE 6.0 04/02/2017 0058   GLUCOSEU NEGATIVE 04/02/2017 0058   HGBUR NEGATIVE 04/02/2017 0058   BILIRUBINUR NEGATIVE 04/02/2017 0058   KETONESUR NEGATIVE 04/02/2017 0058   PROTEINUR NEGATIVE 04/02/2017 0058   NITRITE NEGATIVE 04/02/2017 0058   LEUKOCYTESUR TRACE (A) 04/02/2017 0058   Sepsis Labs Invalid input(s): PROCALCITONIN,  WBC,  LACTICIDVEN Microbiology Recent Results (from the past 240 hour(s))  Blood culture (routine x 2)     Status: None   Collection Time: 04/01/17 11:39 PM  Result Value Ref Range Status   Specimen Description BLOOD LEFT ARM  Final   Special Requests   Final    BOTTLES DRAWN AEROBIC AND ANAEROBIC Blood Culture adequate volume Performed at [facility], [address] [place], [place] [address]    Culture   Final    NO GROWTH 5 DAYS Performed at [facility], [address], [place], [place] [address]    Report Status 04/07/2017 FINAL  Final  Urine culture  Status: Abnormal    Collection Time: 04/02/17 12:58 AM  Result Value Ref Range Status   Specimen Description   Final    URINE, RANDOM Performed at [facility], [address] [place], [place] [address]    Special Requests   Final    NONE Performed at [facility], [address], [place], [place] [address]    Culture MULTIPLE SPECIES PRESENT, SUGGEST RECOLLECTION (A)  Final   Report Status 04/03/2017 FINAL  Final  Blood culture (routine x 2)     Status: None   Collection Time: 04/02/17  3:58 AM  Result Value Ref Range Status   Specimen Description   Final    BLOOD LEFT WRIST Performed at [facility], [address], [place], [place] [address]    Special Requests   Final    IN PEDIATRIC BOTTLE Blood Culture adequate volume Performed at [facility], [address], [place], [place] [address]    Culture   Final    NO GROWTH 5 DAYS Performed at [facility], [address], [place], [place] [address]    Report Status 04/07/2017 FINAL  Final     Time coordinating discharge: 45 minutes  SIGNED:   Coralie Keens, MD  [facility] 04/10/2017, 12:02 PM Pager [phone]  If 7PM-7AM, please contact night-coverage [email or web address] Password [email or web address]

## 2017-05-13 DEATH — deceased

## 2017-06-14 ENCOUNTER — Inpatient Hospital Stay: Payer: Medicaid Other

## 2017-06-14 ENCOUNTER — Telehealth: Payer: Self-pay

## 2017-06-14 ENCOUNTER — Inpatient Hospital Stay: Payer: Medicaid Other | Admitting: Hematology

## 2017-06-14 NOTE — Telephone Encounter (Signed)
Called [facility] and spoke with nurse on staff inquiring about missed appt today. Notified that pt passed away a few months ago.

## 2019-02-11 IMAGING — CT CT HEAD W/O CM
5 of 9 series · 15 of 47 positions shown, 17 images · non-contrast
Comparison: None.

CLINICAL DATA: C-spine trauma.  Seizure.

EXAM:
CT HEAD WITHOUT CONTRAST
CT CERVICAL SPINE WITHOUT CONTRAST
TECHNIQUE: Multidetector CT imaging of the head and cervical spine was
performed following the standard protocol without intravenous
contrast. Multiplanar CT image reconstructions of the cervical spine
were also generated.

[Series 3: head wo · axial · 0.47mm/px · z∈[-148,-52]mm · 5 of 29 slices shown, 7 images (1 of 2)]
[im 5/29  brain]
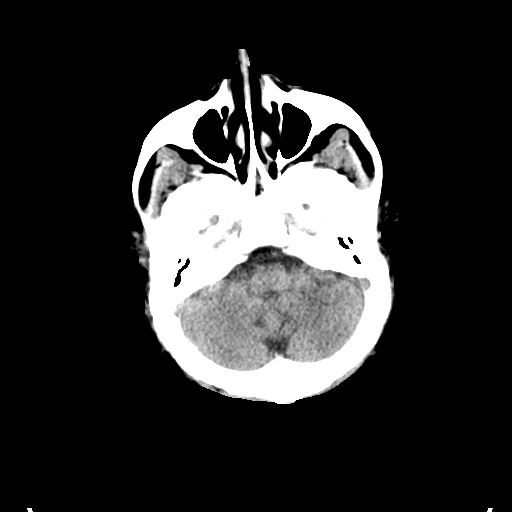
[im 5/29  bone]
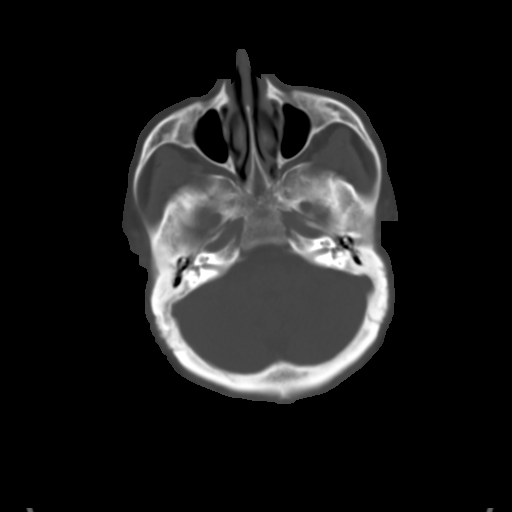
[im 10/29  brain]
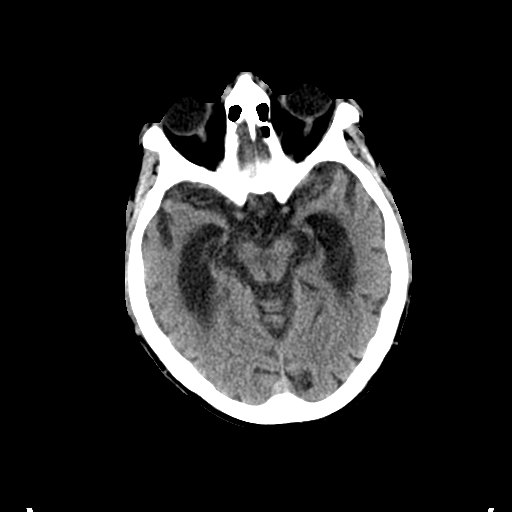
[im 15/29  brain]
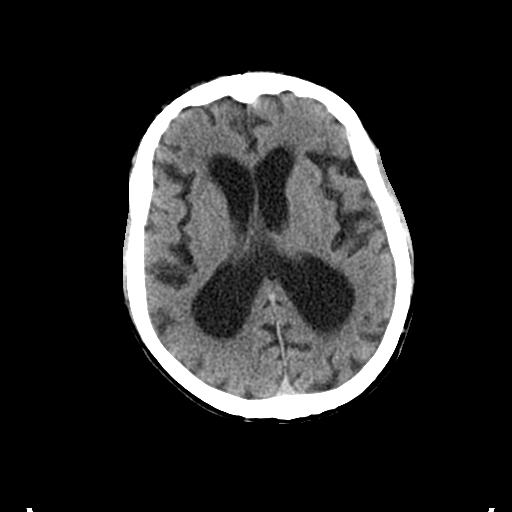
[im 19/29  brain]
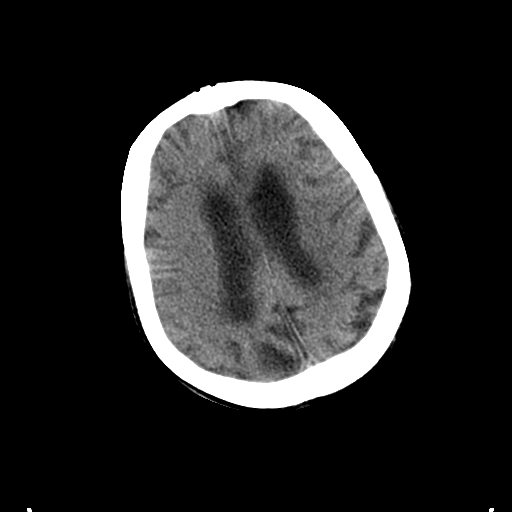
[im 24/29  brain]
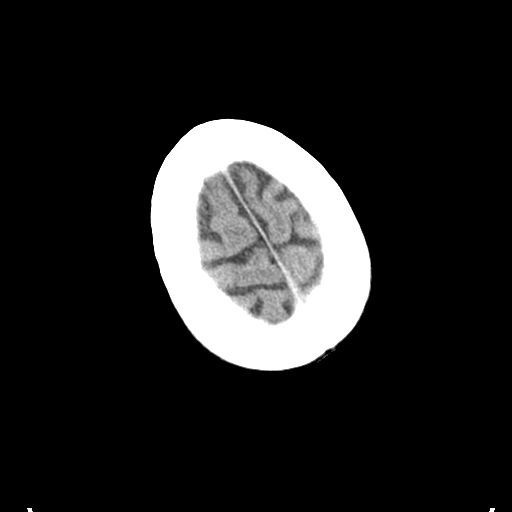
[im 24/29  bone]
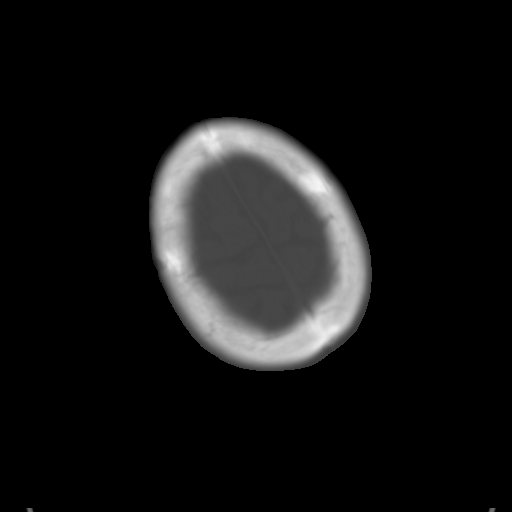

[Series 6: c spine soft · axial · 0.26mm/px · z∈[-278,-210]mm · 5 of 77 slices shown]
[im 9/77  brain]
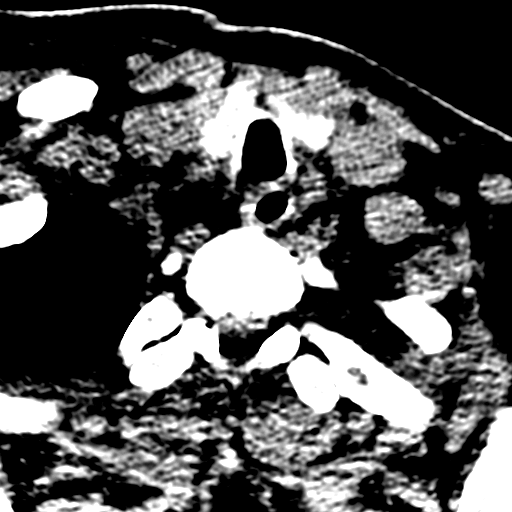
[im 17/77  brain]
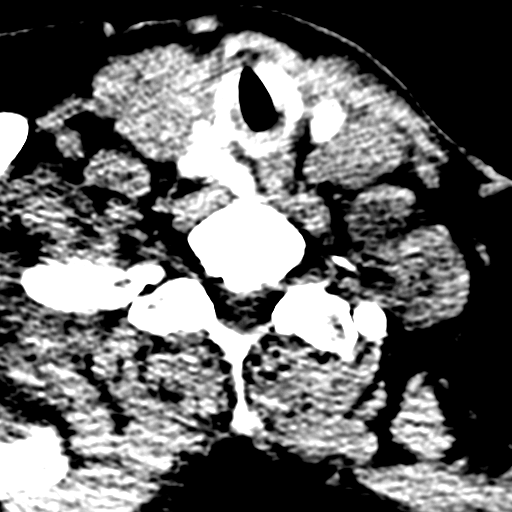
[im 26/77  brain]
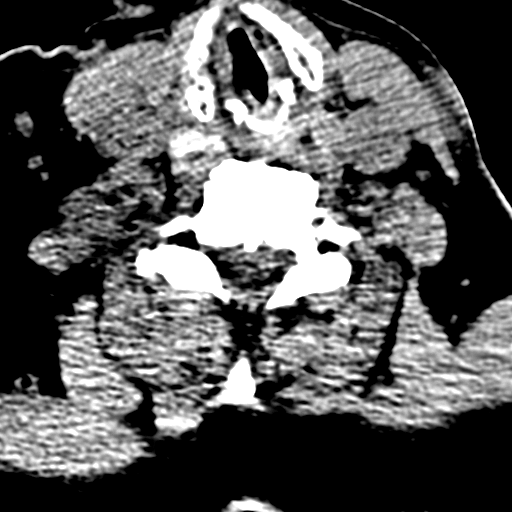
[im 34/77  brain]
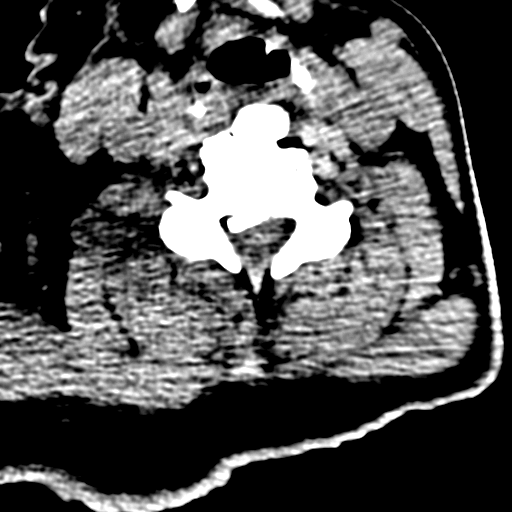
[im 43/77  brain]
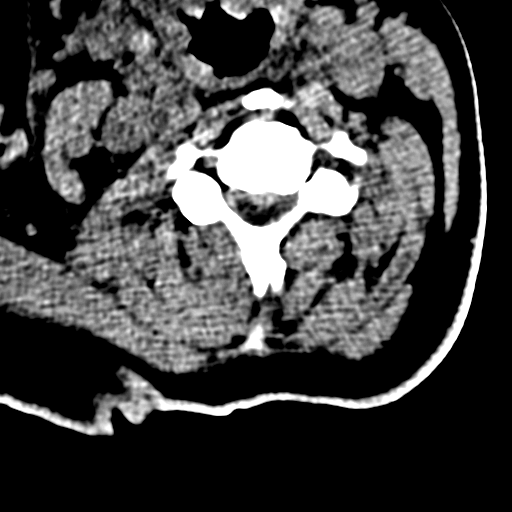

[Series 8: head wo · axial · 0.47mm/px · z∈[-94,-68]mm · 2 of 15 slices shown (2 of 2)]
[im 5/15  brain]
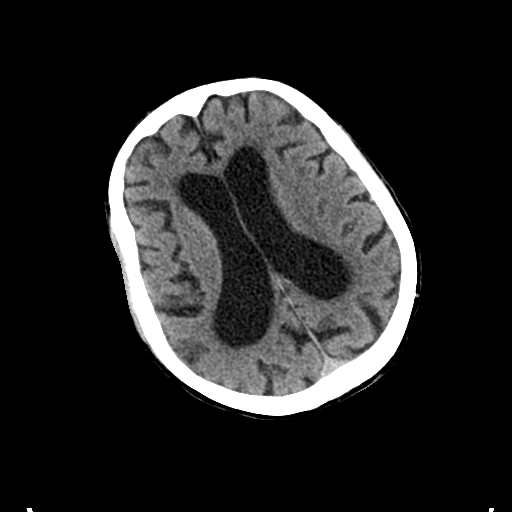
[im 10/15  brain]
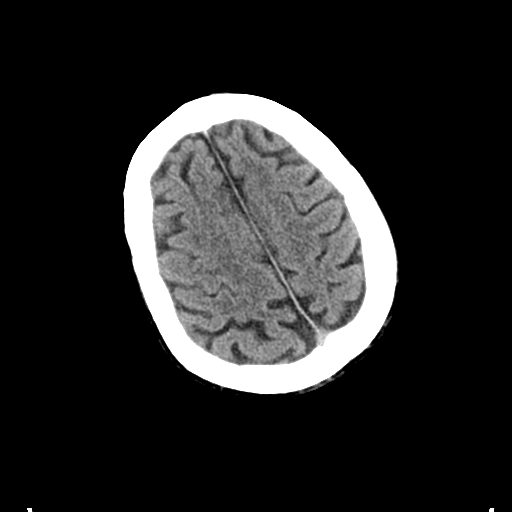

[Series 10: coronal soft tissue · coronal · 0.29mm/px · 2 of 57 slices shown]
[im 19/57  brain]
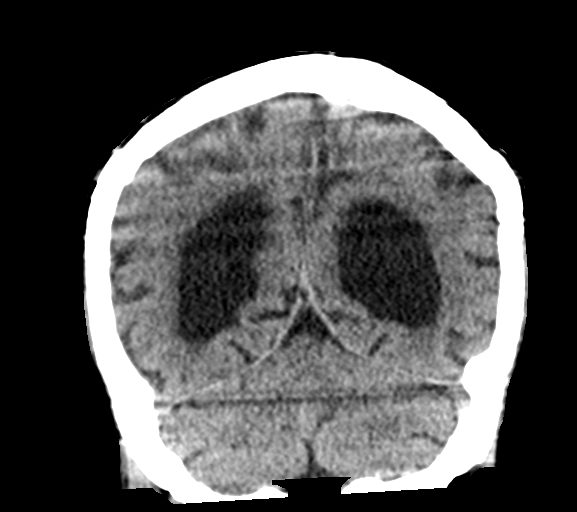
[im 38/57  brain]
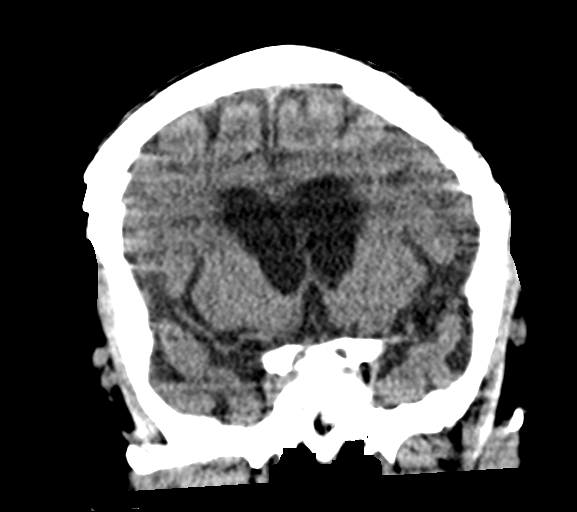

[Series 17: sagittal soft tissue · sagittal · 0.17mm/px · 1 of 48 slices shown]
[im 24/48  brain]
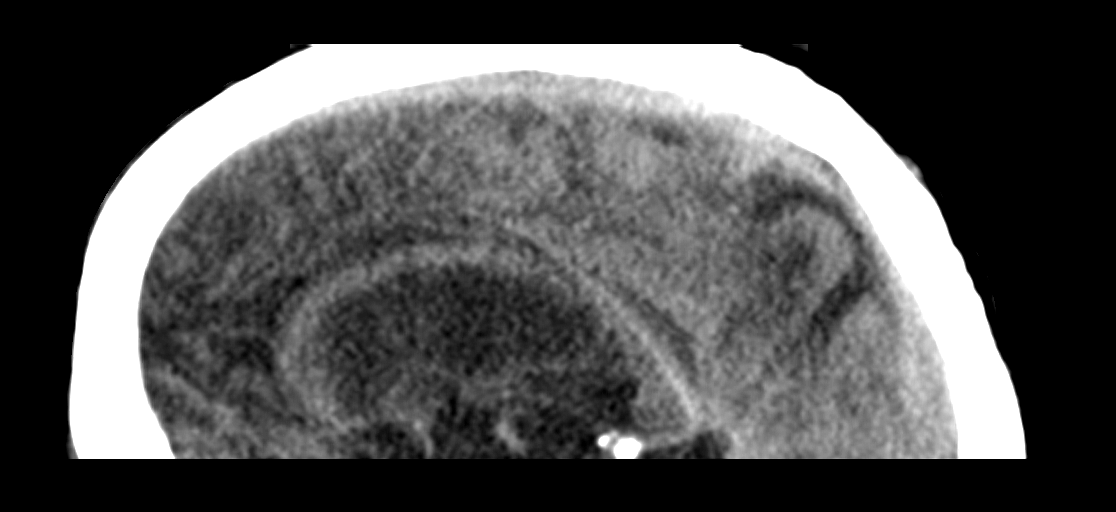

[15 of 47 positions shown; findings below may reference images not displayed]

FINDINGS: CT HEAD FINDINGS

Brain: There is a punctate hyperdense focus in the left basal
ganglia. There is a similar less prominent focus in the right basal
ganglia these are felt to represent basal ganglia calcifications.
Allowing for this, there is no definitive evidence of acute
intracranial hemorrhage. Global atrophy. There is severe dilatation
of the lateral ventricles. Moderate dilatation of the third
ventricle. Fourth ventricle is mildly dilated. There is no mass
effect or midline shift.

Vascular: No hyperdense vessel or unexpected calcification.

Skull: Cranium is intact.

Sinuses/Orbits: No evidence of vitreous or orbital hemorrhage.
Paranasal sinuses are grossly clear.

Other: Noncontributory

CT CERVICAL SPINE FINDINGS

There is straightening of the normally lordotic cervical spine.
There is some malalignment of the left atlantoaxial joint. No
definite acute fracture or dislocation. The pre dens space is at the
upper limit of normal in the gap distance. Chronic bony densities
adjacent to the odontoid is present. Advanced spondylitic changes
with calcification of the posterior longitudinal ligament. This
results in spinal stenosis at C4-5. There is less prominent spinal
stenosis at other levels. There is no obvious spinal hematoma or
soft tissue hematoma within the neck. Prominent central disc
herniation is suspected at C3-4 with spinal stenosis. Lung apices
are grossly clear. Thyroid is unremarkable. There is advanced disc
space narrowing at C3-4, C4-5, and C5-6. Moderate narrowing occurs
at the C2-3 and C6-7 disc spaces.
IMPRESSION: Head: No acute intracranial pathology. Severe supratentorial
hydrocephalus is present and is nonspecific.

Cervical spine: No acute bony injury. Calcification of the posterior
longitudinal ligament results in spinal stenosis at C4-5. Prominent
central disc herniation is suspected at C3-4 causing spinal
stenosis.

## 2019-02-17 IMAGING — CR DG CHEST 2V
2 series · 2 of 2 positions shown · non-contrast
Comparison: None.

CLINICAL DATA: 56-year-old female with shortness of breath.

EXAM:
CHEST  2 VIEW

[w chest lat]
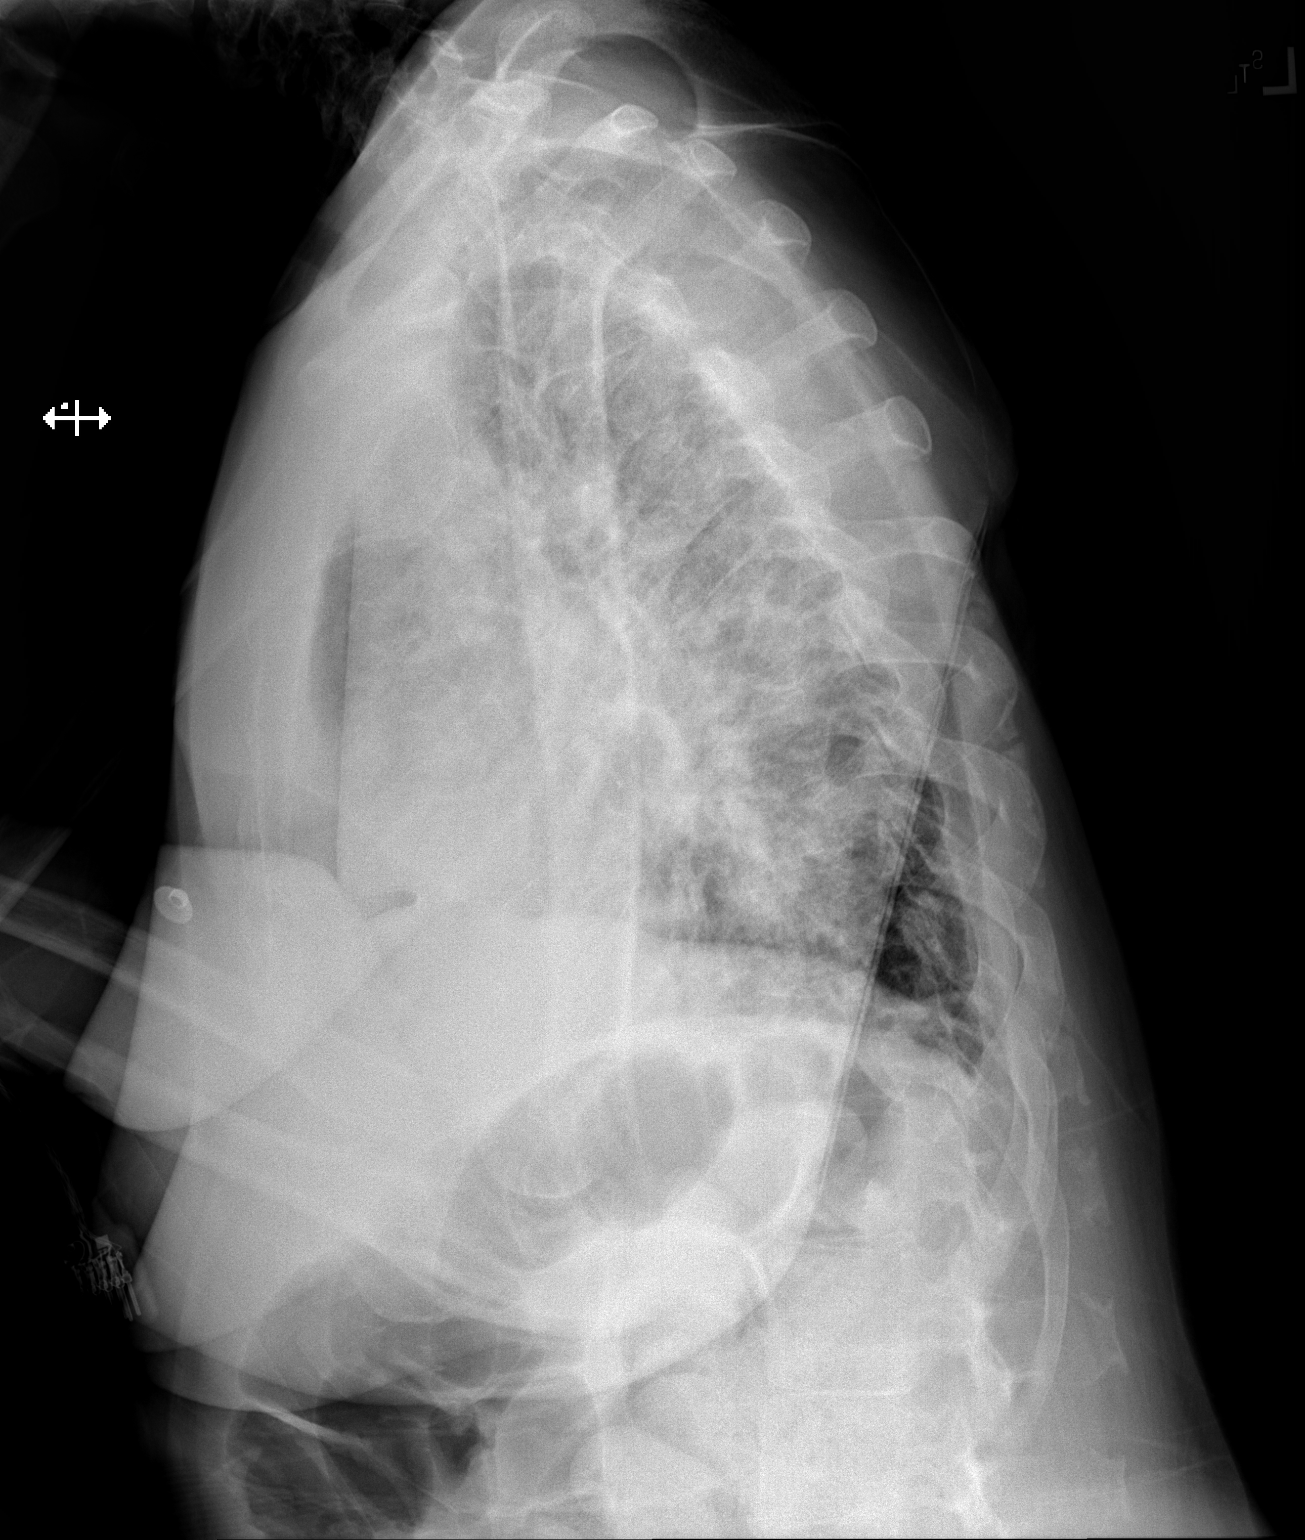

[x chest ap]
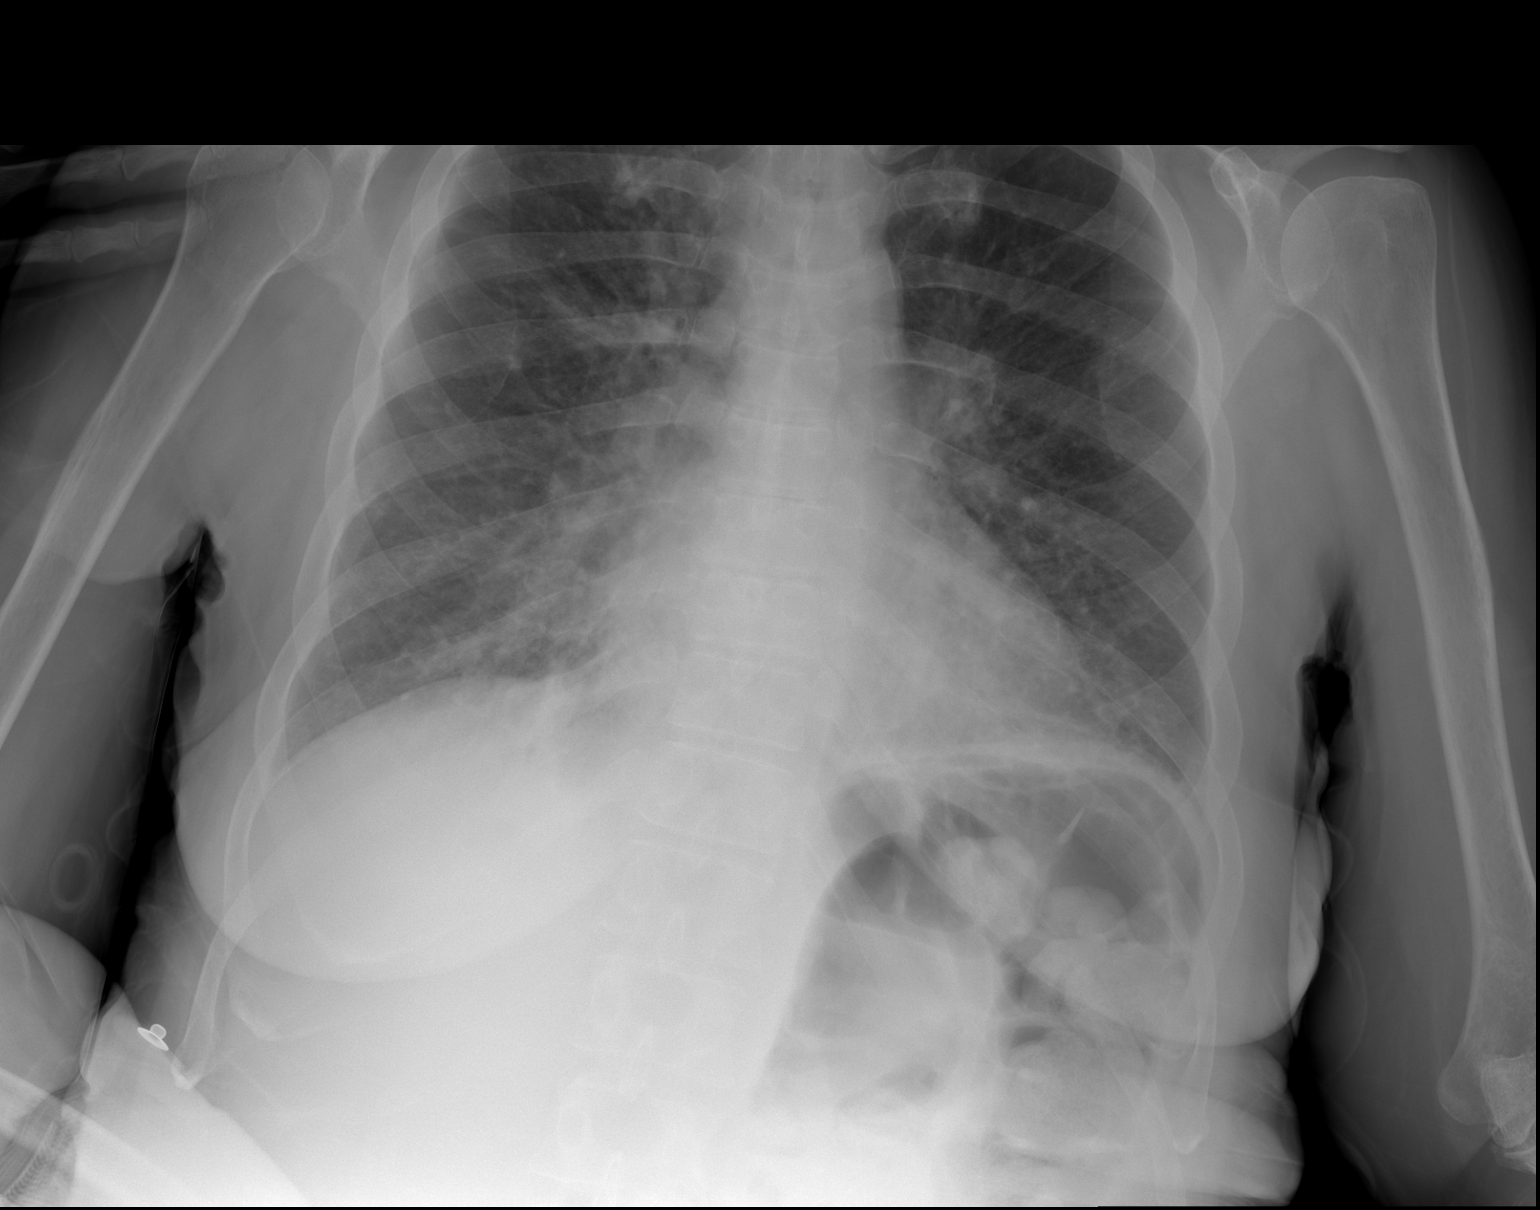

[2 of 2 positions shown; findings below may reference images not displayed]

FINDINGS: Shallow inspiration with minimal bibasilar atelectasis. There is no
focal consolidation, pleural effusion, pneumothorax. The cardiac
silhouette is within normal limits. Mild prominence of the central
vasculature may represent mild vascular congestion. No acute osseous
pathology.
IMPRESSION: Probable mild vascular congestion.  No focal consolidation.

## 2019-02-22 IMAGING — DX DG CHEST 1V
1 series · 1 of 1 positions shown · non-contrast
Comparison: 02/02/2017

CLINICAL DATA: Chest congestion

EXAM:
CHEST  1 VIEW

[chest ap]
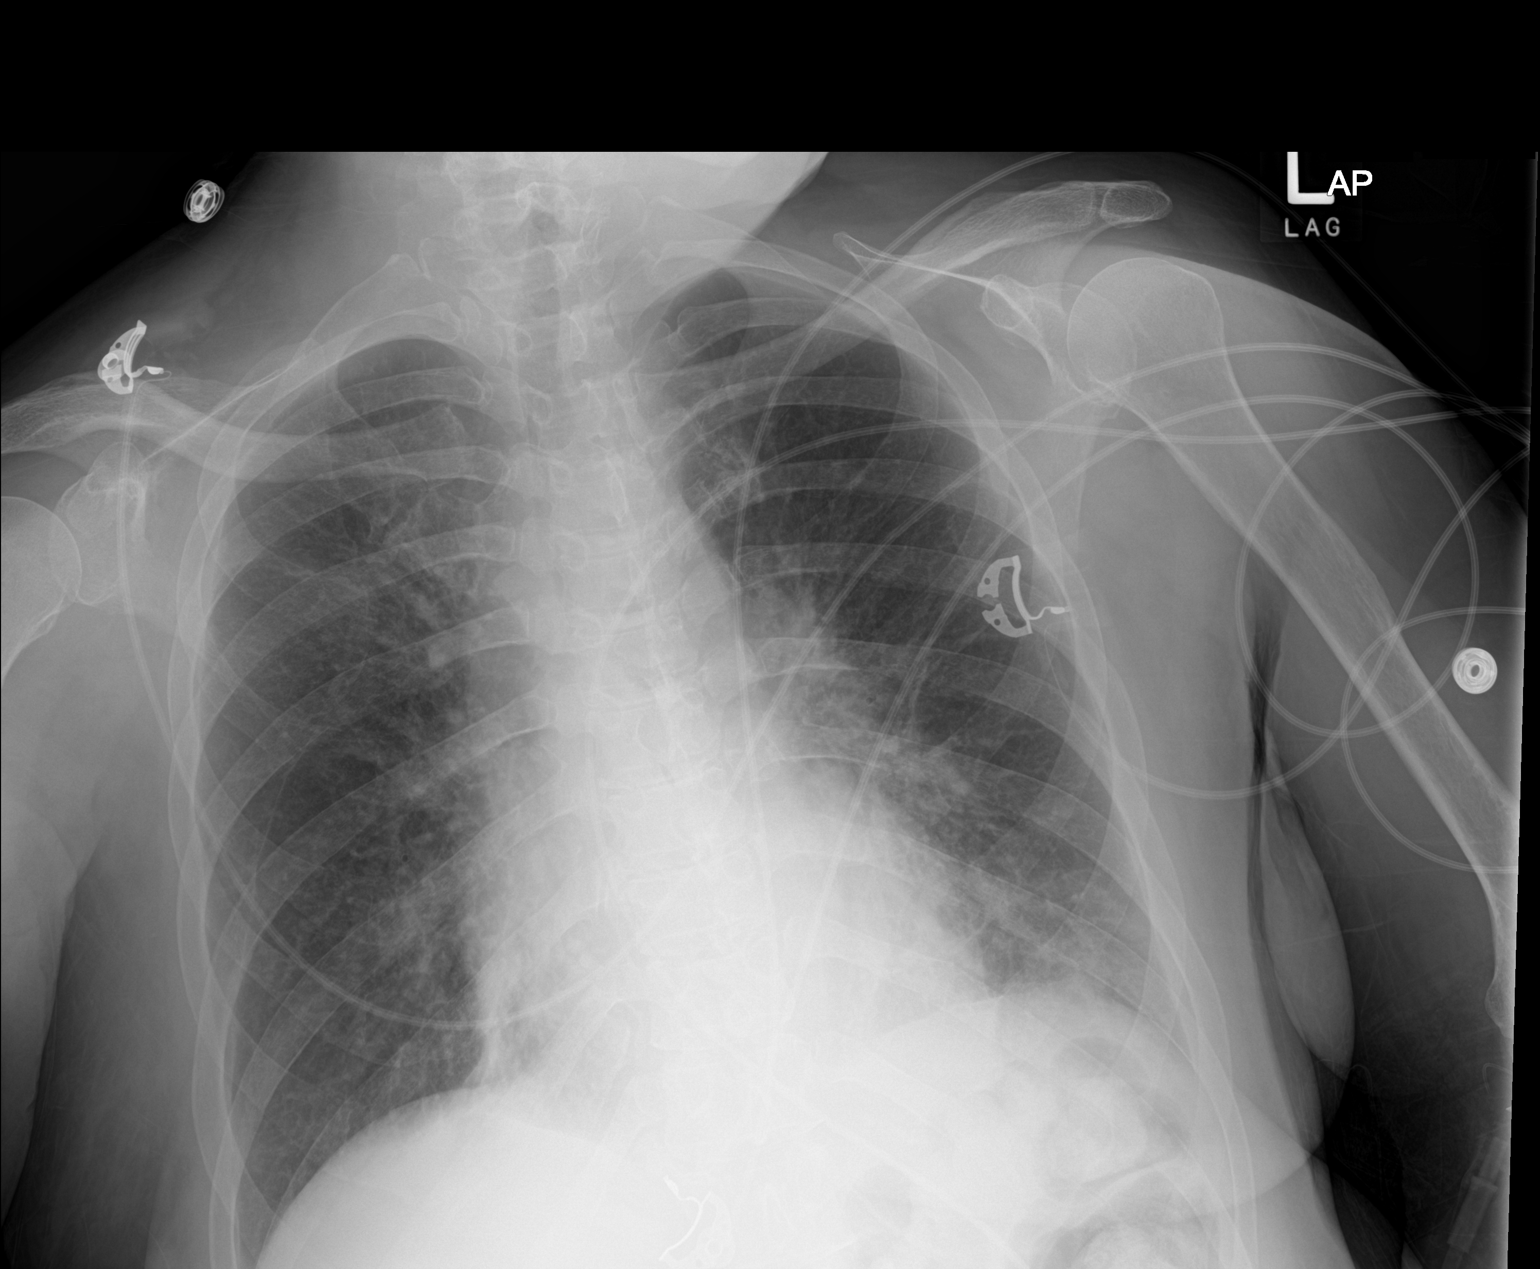

[1 of 1 positions shown; findings below may reference images not displayed]

FINDINGS: Patchy consolidation has developed at the left base. Normal
vascularity. Normal heart size. No pneumothorax. No pleural
effusion.
IMPRESSION: Increasing patchy consolidation at the left lung base. Followup PA
and lateral chest X-ray is recommended in 3-4 weeks following trial
of antibiotic therapy to ensure resolution and exclude underlying
malignancy.

## 2019-04-17 IMAGING — DX DG PELVIS 1-2V
1 series · 2 of 2 positions shown · non-contrast
Comparison: None.

CLINICAL DATA: Pressure ulcer in the sacral area

EXAM:
PELVIS - 1-2 VIEW

[Series 1: pelvis ap · 0.14mm/px · 2 of 2 slices shown]
[im 1/2]
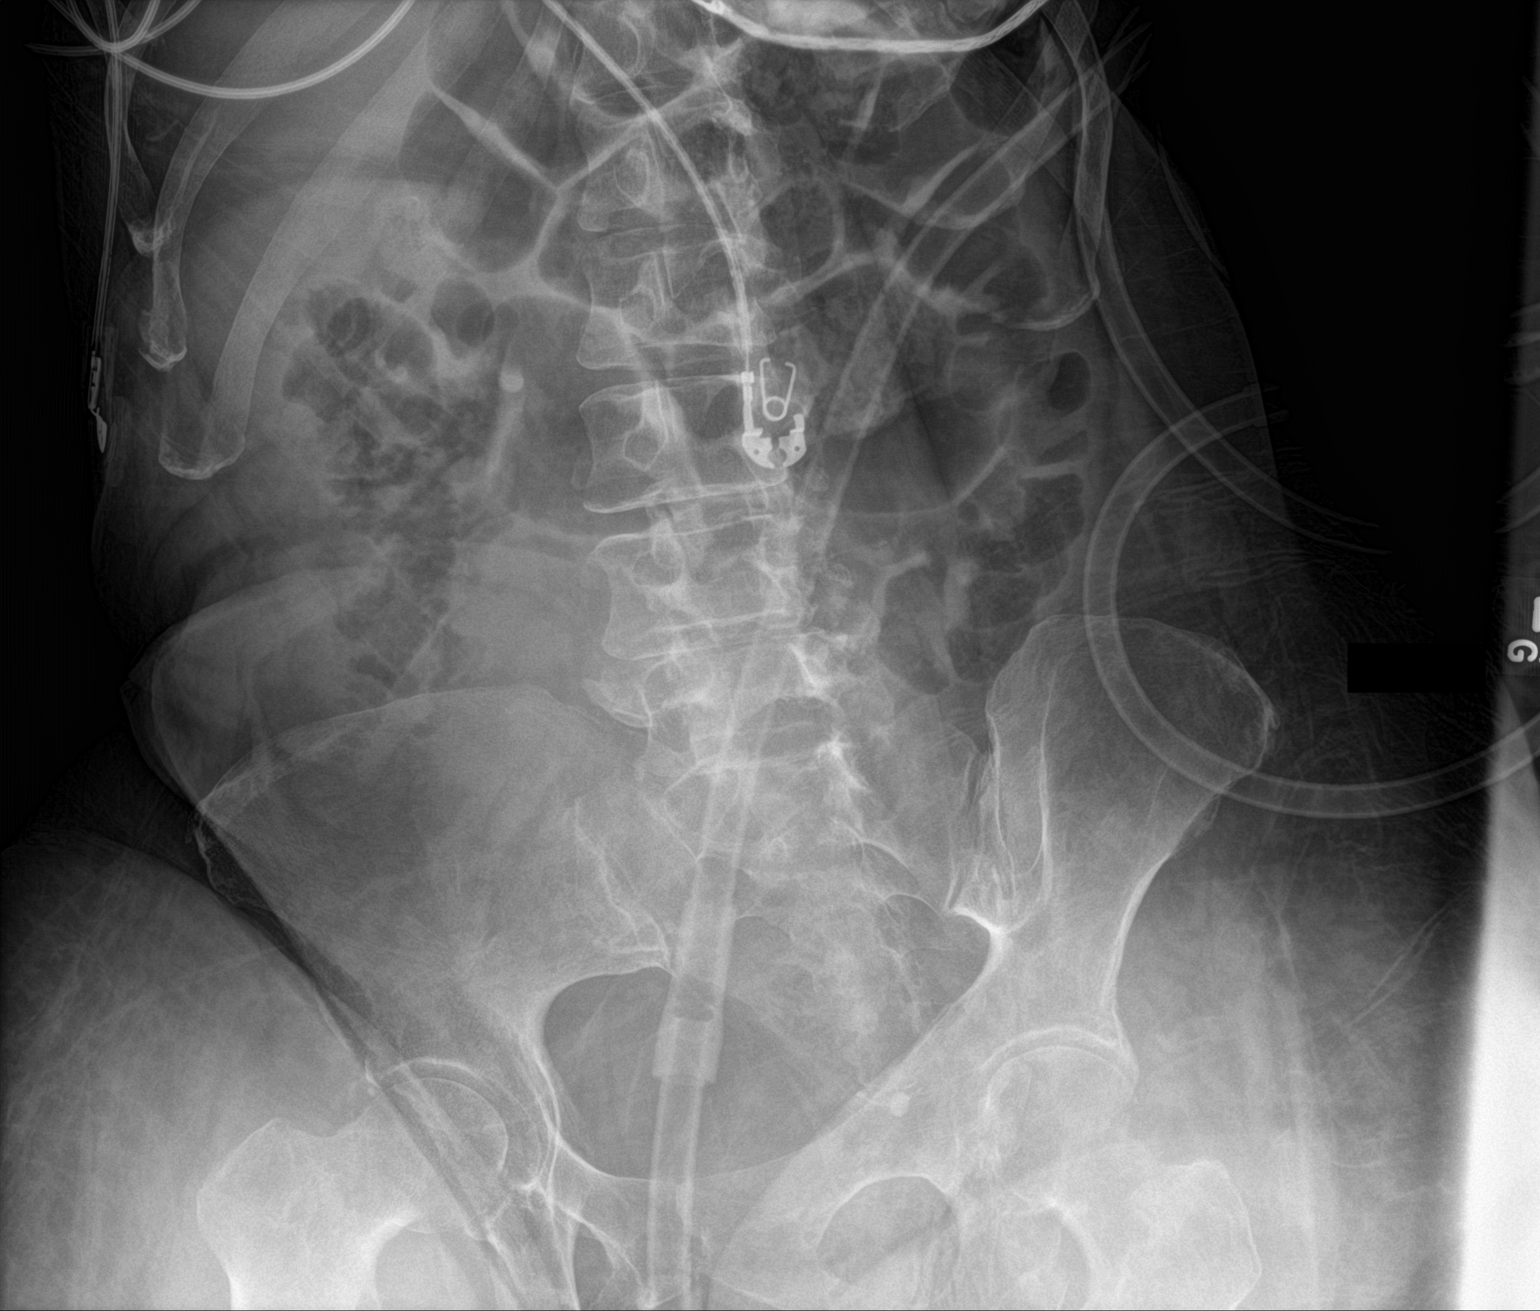
[im 2/2]
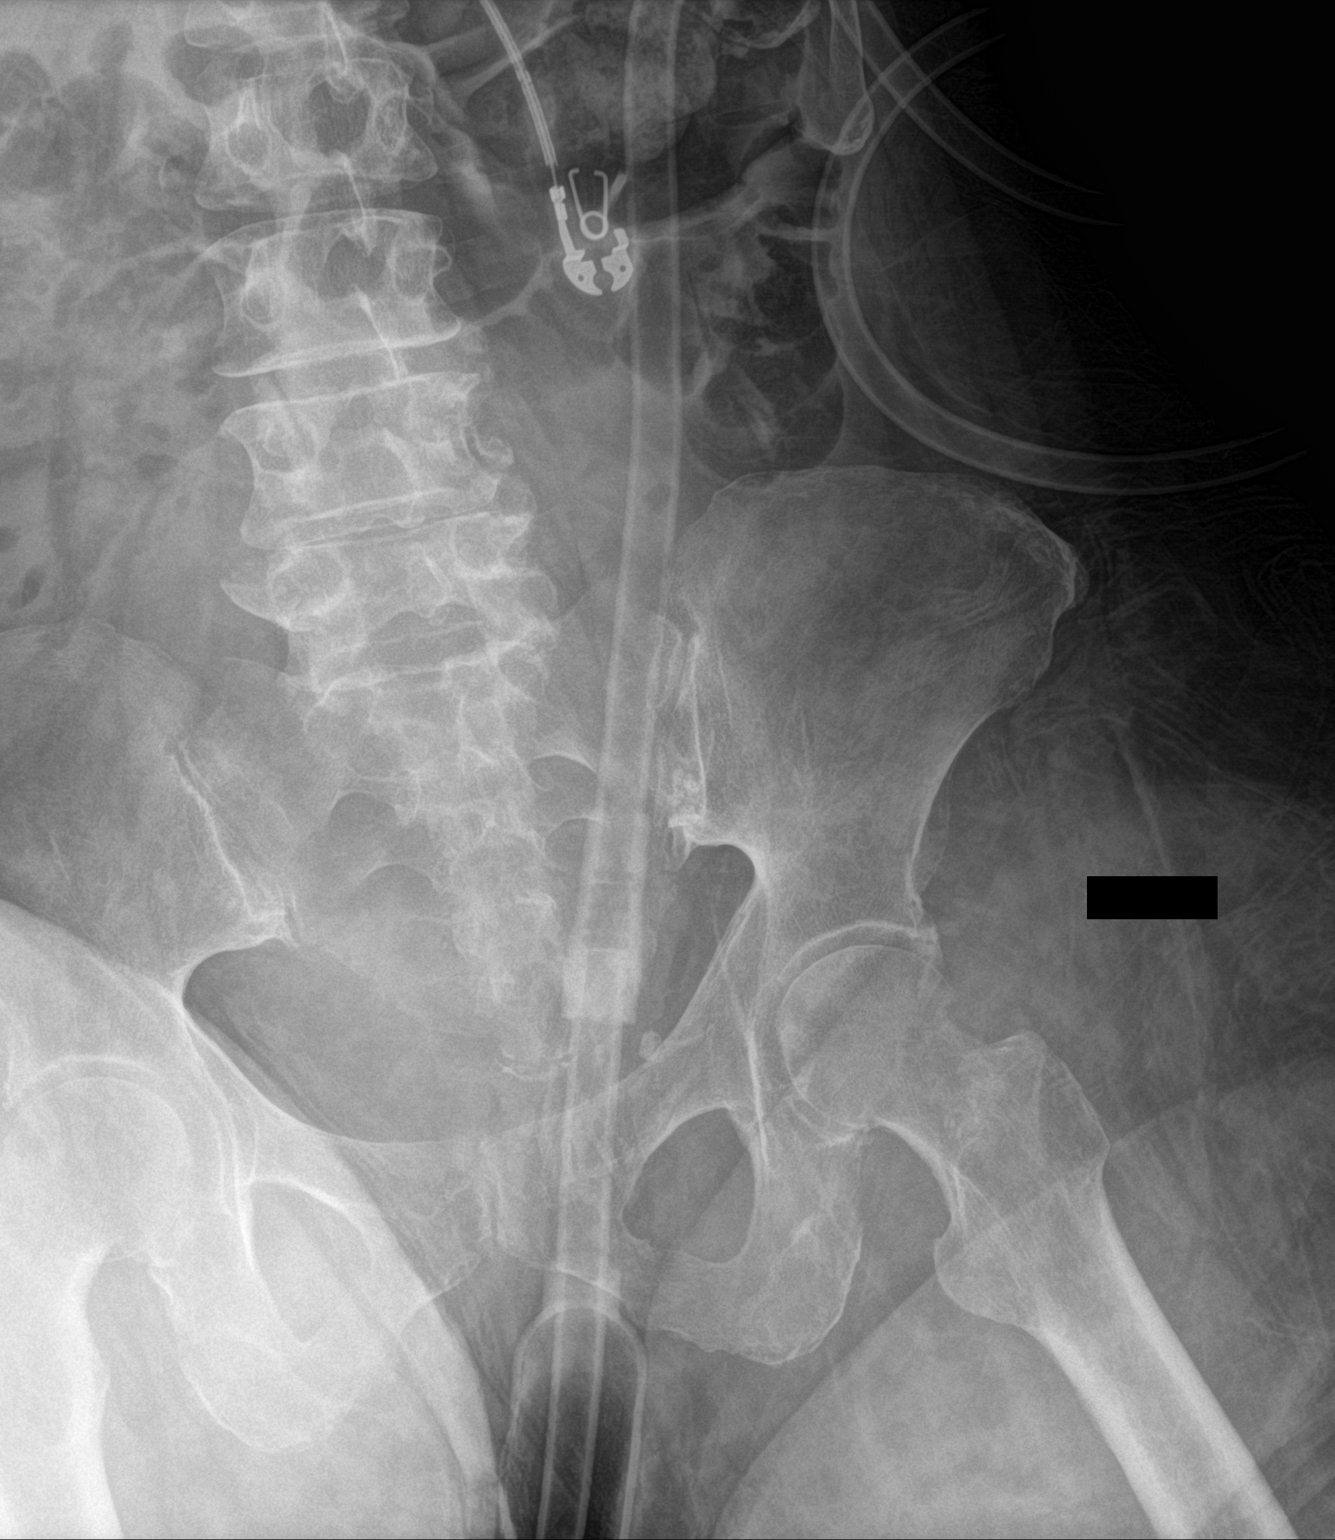

[2 of 2 positions shown; findings below may reference images not displayed]

FINDINGS: Limited by positioning. Pubic symphysis and rami are intact. Both
femoral heads project in joint. No acute fracture.
IMPRESSION: No acute osseous abnormality allowing for positioning.
